# Patient Record
Sex: Female | Born: 1940 | Race: Black or African American | Hispanic: No | Smoking: Never smoker
Health system: Southern US, Community
[De-identification: ages and names within clinical notes are randomized; demographics above are authoritative.]

## PROBLEM LIST (undated history)

## (undated) DIAGNOSIS — I509 Heart failure, unspecified: Secondary | ICD-10-CM

## (undated) DIAGNOSIS — D869 Sarcoidosis, unspecified: Secondary | ICD-10-CM

## (undated) DIAGNOSIS — E119 Type 2 diabetes mellitus without complications: Secondary | ICD-10-CM

## (undated) DIAGNOSIS — C801 Malignant (primary) neoplasm, unspecified: Secondary | ICD-10-CM

## (undated) HISTORY — PX: PACEMAKER IMPLANT: EP1218

---

## 2002-08-02 HISTORY — PX: MASTECTOMY: SHX3

## 2017-12-03 ENCOUNTER — Inpatient Hospital Stay (HOSPITAL_COMMUNITY): Payer: Medicare HMO | Admitting: Certified Registered"

## 2017-12-03 ENCOUNTER — Inpatient Hospital Stay (HOSPITAL_COMMUNITY): Payer: Medicare HMO

## 2017-12-03 ENCOUNTER — Encounter (HOSPITAL_COMMUNITY)
Admission: EM | Disposition: A | Discharge status: Skilled Nursing Facility | Payer: Self-pay | Source: Home / Self Care | Attending: Internal Medicine

## 2017-12-03 ENCOUNTER — Emergency Department (HOSPITAL_BASED_OUTPATIENT_CLINIC_OR_DEPARTMENT_OTHER): Payer: Medicare HMO

## 2017-12-03 ENCOUNTER — Other Ambulatory Visit: Payer: Self-pay

## 2017-12-03 ENCOUNTER — Inpatient Hospital Stay (HOSPITAL_BASED_OUTPATIENT_CLINIC_OR_DEPARTMENT_OTHER)
Admission: EM | Admit: 2017-12-03 | Discharge: 2017-12-15 | DRG: 023 | Disposition: A | Discharge status: Skilled Nursing Facility | Payer: Medicare HMO | Attending: Family Medicine | Admitting: Family Medicine

## 2017-12-03 ENCOUNTER — Emergency Department (HOSPITAL_COMMUNITY): Payer: Medicare HMO

## 2017-12-03 ENCOUNTER — Encounter (HOSPITAL_BASED_OUTPATIENT_CLINIC_OR_DEPARTMENT_OTHER): Payer: Self-pay | Admitting: Emergency Medicine

## 2017-12-03 DIAGNOSIS — I618 Other nontraumatic intracerebral hemorrhage: Secondary | ICD-10-CM | POA: Diagnosis present

## 2017-12-03 DIAGNOSIS — I509 Heart failure, unspecified: Secondary | ICD-10-CM

## 2017-12-03 DIAGNOSIS — I130 Hypertensive heart and chronic kidney disease with heart failure and stage 1 through stage 4 chronic kidney disease, or unspecified chronic kidney disease: Secondary | ICD-10-CM | POA: Diagnosis present

## 2017-12-03 DIAGNOSIS — K922 Gastrointestinal hemorrhage, unspecified: Secondary | ICD-10-CM | POA: Diagnosis not present

## 2017-12-03 DIAGNOSIS — N183 Chronic kidney disease, stage 3 (moderate): Secondary | ICD-10-CM | POA: Diagnosis not present

## 2017-12-03 DIAGNOSIS — J90 Pleural effusion, not elsewhere classified: Secondary | ICD-10-CM | POA: Diagnosis not present

## 2017-12-03 DIAGNOSIS — R402342 Coma scale, best motor response, flexion withdrawal, at arrival to emergency department: Secondary | ICD-10-CM | POA: Diagnosis present

## 2017-12-03 DIAGNOSIS — K7031 Alcoholic cirrhosis of liver with ascites: Secondary | ICD-10-CM

## 2017-12-03 DIAGNOSIS — I482 Chronic atrial fibrillation, unspecified: Secondary | ICD-10-CM

## 2017-12-03 DIAGNOSIS — I081 Rheumatic disorders of both mitral and tricuspid valves: Secondary | ICD-10-CM | POA: Diagnosis present

## 2017-12-03 DIAGNOSIS — J9601 Acute respiratory failure with hypoxia: Secondary | ICD-10-CM | POA: Insufficient documentation

## 2017-12-03 DIAGNOSIS — D689 Coagulation defect, unspecified: Secondary | ICD-10-CM | POA: Diagnosis not present

## 2017-12-03 DIAGNOSIS — K766 Portal hypertension: Secondary | ICD-10-CM | POA: Diagnosis present

## 2017-12-03 DIAGNOSIS — I50811 Acute right heart failure: Secondary | ICD-10-CM | POA: Diagnosis not present

## 2017-12-03 DIAGNOSIS — Z950 Presence of cardiac pacemaker: Secondary | ICD-10-CM

## 2017-12-03 DIAGNOSIS — I639 Cerebral infarction, unspecified: Secondary | ICD-10-CM | POA: Diagnosis present

## 2017-12-03 DIAGNOSIS — R29734 NIHSS score 34: Secondary | ICD-10-CM | POA: Diagnosis present

## 2017-12-03 DIAGNOSIS — I313 Pericardial effusion (noninflammatory): Secondary | ICD-10-CM | POA: Diagnosis present

## 2017-12-03 DIAGNOSIS — I371 Nonrheumatic pulmonary valve insufficiency: Secondary | ICD-10-CM | POA: Diagnosis not present

## 2017-12-03 DIAGNOSIS — I5033 Acute on chronic diastolic (congestive) heart failure: Secondary | ICD-10-CM | POA: Diagnosis present

## 2017-12-03 DIAGNOSIS — R791 Abnormal coagulation profile: Secondary | ICD-10-CM | POA: Diagnosis not present

## 2017-12-03 DIAGNOSIS — N179 Acute kidney failure, unspecified: Secondary | ICD-10-CM | POA: Diagnosis present

## 2017-12-03 DIAGNOSIS — Z452 Encounter for adjustment and management of vascular access device: Secondary | ICD-10-CM

## 2017-12-03 DIAGNOSIS — D649 Anemia, unspecified: Secondary | ICD-10-CM | POA: Diagnosis not present

## 2017-12-03 DIAGNOSIS — Z4659 Encounter for fitting and adjustment of other gastrointestinal appliance and device: Secondary | ICD-10-CM

## 2017-12-03 DIAGNOSIS — I63312 Cerebral infarction due to thrombosis of left middle cerebral artery: Secondary | ICD-10-CM

## 2017-12-03 DIAGNOSIS — I6602 Occlusion and stenosis of left middle cerebral artery: Secondary | ICD-10-CM | POA: Diagnosis present

## 2017-12-03 DIAGNOSIS — I700 Atherosclerosis of aorta: Secondary | ICD-10-CM | POA: Diagnosis not present

## 2017-12-03 DIAGNOSIS — I63512 Cerebral infarction due to unspecified occlusion or stenosis of left middle cerebral artery: Secondary | ICD-10-CM | POA: Diagnosis not present

## 2017-12-03 DIAGNOSIS — E871 Hypo-osmolality and hyponatremia: Secondary | ICD-10-CM | POA: Diagnosis not present

## 2017-12-03 DIAGNOSIS — I6389 Other cerebral infarction: Secondary | ICD-10-CM | POA: Diagnosis not present

## 2017-12-03 DIAGNOSIS — I63412 Cerebral infarction due to embolism of left middle cerebral artery: Secondary | ICD-10-CM | POA: Diagnosis present

## 2017-12-03 DIAGNOSIS — J9621 Acute and chronic respiratory failure with hypoxia: Secondary | ICD-10-CM | POA: Diagnosis not present

## 2017-12-03 DIAGNOSIS — D860 Sarcoidosis of lung: Secondary | ICD-10-CM | POA: Diagnosis not present

## 2017-12-03 DIAGNOSIS — I959 Hypotension, unspecified: Secondary | ICD-10-CM | POA: Diagnosis not present

## 2017-12-03 DIAGNOSIS — J9690 Respiratory failure, unspecified, unspecified whether with hypoxia or hypercapnia: Secondary | ICD-10-CM | POA: Diagnosis present

## 2017-12-03 DIAGNOSIS — Z79810 Long term (current) use of selective estrogen receptor modulators (SERMs): Secondary | ICD-10-CM

## 2017-12-03 DIAGNOSIS — I480 Paroxysmal atrial fibrillation: Secondary | ICD-10-CM | POA: Diagnosis not present

## 2017-12-03 DIAGNOSIS — K3189 Other diseases of stomach and duodenum: Secondary | ICD-10-CM | POA: Diagnosis not present

## 2017-12-03 DIAGNOSIS — Z79899 Other long term (current) drug therapy: Secondary | ICD-10-CM

## 2017-12-03 DIAGNOSIS — I6523 Occlusion and stenosis of bilateral carotid arteries: Secondary | ICD-10-CM | POA: Diagnosis not present

## 2017-12-03 DIAGNOSIS — D509 Iron deficiency anemia, unspecified: Secondary | ICD-10-CM | POA: Diagnosis present

## 2017-12-03 DIAGNOSIS — K761 Chronic passive congestion of liver: Secondary | ICD-10-CM | POA: Diagnosis present

## 2017-12-03 DIAGNOSIS — E1151 Type 2 diabetes mellitus with diabetic peripheral angiopathy without gangrene: Secondary | ICD-10-CM | POA: Diagnosis present

## 2017-12-03 DIAGNOSIS — I6502 Occlusion and stenosis of left vertebral artery: Secondary | ICD-10-CM | POA: Diagnosis present

## 2017-12-03 DIAGNOSIS — I272 Pulmonary hypertension, unspecified: Secondary | ICD-10-CM | POA: Diagnosis not present

## 2017-12-03 DIAGNOSIS — G8191 Hemiplegia, unspecified affecting right dominant side: Secondary | ICD-10-CM | POA: Diagnosis present

## 2017-12-03 DIAGNOSIS — E1122 Type 2 diabetes mellitus with diabetic chronic kidney disease: Secondary | ICD-10-CM | POA: Diagnosis present

## 2017-12-03 DIAGNOSIS — E785 Hyperlipidemia, unspecified: Secondary | ICD-10-CM | POA: Diagnosis present

## 2017-12-03 DIAGNOSIS — E1165 Type 2 diabetes mellitus with hyperglycemia: Secondary | ICD-10-CM | POA: Diagnosis present

## 2017-12-03 DIAGNOSIS — I50813 Acute on chronic right heart failure: Secondary | ICD-10-CM | POA: Diagnosis not present

## 2017-12-03 DIAGNOSIS — R402132 Coma scale, eyes open, to sound, at arrival to emergency department: Secondary | ICD-10-CM | POA: Diagnosis present

## 2017-12-03 DIAGNOSIS — R4701 Aphasia: Secondary | ICD-10-CM | POA: Diagnosis present

## 2017-12-03 DIAGNOSIS — D869 Sarcoidosis, unspecified: Secondary | ICD-10-CM | POA: Diagnosis not present

## 2017-12-03 DIAGNOSIS — J9811 Atelectasis: Secondary | ICD-10-CM | POA: Diagnosis not present

## 2017-12-03 DIAGNOSIS — Z9012 Acquired absence of left breast and nipple: Secondary | ICD-10-CM

## 2017-12-03 DIAGNOSIS — D0512 Intraductal carcinoma in situ of left breast: Secondary | ICD-10-CM | POA: Diagnosis present

## 2017-12-03 DIAGNOSIS — I361 Nonrheumatic tricuspid (valve) insufficiency: Secondary | ICD-10-CM | POA: Diagnosis not present

## 2017-12-03 HISTORY — DX: Heart failure, unspecified: I50.9

## 2017-12-03 HISTORY — DX: Sarcoidosis, unspecified: D86.9

## 2017-12-03 HISTORY — PX: IR PERCUTANEOUS ART THROMBECTOMY/INFUSION INTRACRANIAL INC DIAG ANGIO: IMG6087

## 2017-12-03 HISTORY — DX: Type 2 diabetes mellitus without complications: E11.9

## 2017-12-03 HISTORY — PX: IR CT HEAD LTD: IMG2386

## 2017-12-03 HISTORY — DX: Malignant (primary) neoplasm, unspecified: C80.1

## 2017-12-03 HISTORY — PX: RADIOLOGY WITH ANESTHESIA: SHX6223

## 2017-12-03 LAB — COMPREHENSIVE METABOLIC PANEL
ALK PHOS: 48 U/L (ref 38–126)
ALT: 10 U/L — AB (ref 14–54)
ANION GAP: 13 (ref 5–15)
AST: 19 U/L (ref 15–41)
Albumin: 2.8 g/dL — ABNORMAL LOW (ref 3.5–5.0)
BILIRUBIN TOTAL: 1 mg/dL (ref 0.3–1.2)
BUN: 51 mg/dL — ABNORMAL HIGH (ref 6–20)
CALCIUM: 8.4 mg/dL — AB (ref 8.9–10.3)
CO2: 21 mmol/L — ABNORMAL LOW (ref 22–32)
CREATININE: 2.04 mg/dL — AB (ref 0.44–1.00)
Chloride: 94 mmol/L — ABNORMAL LOW (ref 101–111)
GFR, EST AFRICAN AMERICAN: 26 mL/min — AB (ref >60)
GFR, EST NON AFRICAN AMERICAN: 23 mL/min — AB (ref >60)
Glucose, Bld: 137 mg/dL — ABNORMAL HIGH (ref 65–99)
Potassium: 4.2 mmol/L (ref 3.5–5.1)
Sodium: 128 mmol/L — ABNORMAL LOW (ref 135–145)
TOTAL PROTEIN: 5.8 g/dL — AB (ref 6.5–8.1)

## 2017-12-03 LAB — DIFFERENTIAL
BASOS PCT: 1 %
Basophils Absolute: 0.1 10*3/uL (ref 0.0–0.1)
EOS PCT: 1 %
Eosinophils Absolute: 0.1 10*3/uL (ref 0.0–0.7)
LYMPHS ABS: 1.3 10*3/uL (ref 0.7–4.0)
LYMPHS PCT: 19 %
MONOS PCT: 16 %
Monocytes Absolute: 1.1 10*3/uL — ABNORMAL HIGH (ref 0.1–1.0)
NEUTROS ABS: 4.5 10*3/uL (ref 1.7–7.7)
Neutrophils Relative %: 63 %

## 2017-12-03 LAB — CBG MONITORING, ED
GLUCOSE-CAPILLARY: 118 mg/dL — AB (ref 65–99)
GLUCOSE-CAPILLARY: 149 mg/dL — AB (ref 65–99)
GLUCOSE-CAPILLARY: 113 mg/dL — AB (ref 65–99)

## 2017-12-03 LAB — RAPID URINE DRUG SCREEN, HOSP PERFORMED
Amphetamines: NOT DETECTED
BARBITURATES: NOT DETECTED
Benzodiazepines: POSITIVE — AB
Cocaine: NOT DETECTED
OPIATES: NOT DETECTED
Tetrahydrocannabinol: NOT DETECTED

## 2017-12-03 LAB — I-STAT ARTERIAL BLOOD GAS, ED
ACID-BASE EXCESS: 5 mmol/L — AB (ref 0.0–2.0)
Bicarbonate: 27.7 mmol/L (ref 20.0–28.0)
O2 SAT: 99 %
TCO2: 29 mmol/L (ref 22–32)
pCO2 arterial: 32 mmHg (ref 32.0–48.0)
pH, Arterial: 7.546 — ABNORMAL HIGH (ref 7.350–7.450)
pO2, Arterial: 140 mmHg — ABNORMAL HIGH (ref 83.0–108.0)

## 2017-12-03 LAB — URINALYSIS, ROUTINE W REFLEX MICROSCOPIC
Bilirubin Urine: NEGATIVE
Glucose, UA: NEGATIVE mg/dL
HGB URINE DIPSTICK: NEGATIVE
KETONES UR: NEGATIVE mg/dL
Leukocytes, UA: NEGATIVE
Nitrite: NEGATIVE
PROTEIN: NEGATIVE mg/dL
Specific Gravity, Urine: 1.015 (ref 1.005–1.030)
pH: 9 — ABNORMAL HIGH (ref 5.0–8.0)

## 2017-12-03 LAB — BLOOD GAS, ARTERIAL
Acid-base deficit: 1.5 mmol/L (ref 0.0–2.0)
Bicarbonate: 22.2 mmol/L (ref 20.0–28.0)
Drawn by: 24513
FIO2: 50
LHR: 16 {breaths}/min
MECHVT: 460 mL
O2 SAT: 96 %
PEEP/CPAP: 5 cmH2O
Patient temperature: 98.6
pCO2 arterial: 34 mmHg (ref 32.0–48.0)
pH, Arterial: 7.431 (ref 7.350–7.450)
pO2, Arterial: 88.2 mmHg (ref 83.0–108.0)

## 2017-12-03 LAB — ETHANOL: Alcohol, Ethyl (B): <10 mg/dL (ref <10)

## 2017-12-03 LAB — CBC
HCT: 28.6 % — ABNORMAL LOW (ref 36.0–46.0)
Hemoglobin: 10.1 g/dL — ABNORMAL LOW (ref 12.0–15.0)
MCH: 24 pg — ABNORMAL LOW (ref 26.0–34.0)
MCHC: 35.3 g/dL (ref 30.0–36.0)
MCV: 68.1 fL — ABNORMAL LOW (ref 78.0–100.0)
PLATELETS: 432 10*3/uL — AB (ref 150–400)
RBC: 4.2 MIL/uL (ref 3.87–5.11)
RDW: 19.7 % — AB (ref 11.5–15.5)
WBC: 7.1 10*3/uL (ref 4.0–10.5)

## 2017-12-03 LAB — TRIGLYCERIDES: Triglycerides: 63 mg/dL (ref <150)

## 2017-12-03 LAB — BRAIN NATRIURETIC PEPTIDE: B Natriuretic Peptide: 381.5 pg/mL — ABNORMAL HIGH (ref 0.0–100.0)

## 2017-12-03 LAB — TROPONIN I: Troponin I: <0.03 ng/mL (ref <0.03)

## 2017-12-03 LAB — MRSA PCR SCREENING: MRSA by PCR: NEGATIVE

## 2017-12-03 SURGERY — IR WITH ANESTHESIA
Anesthesia: General

## 2017-12-03 MED ORDER — GLYCOPYRROLATE 0.2 MG/ML IJ SOLN
INTRAMUSCULAR | Status: DC | PRN
  Administered 2017-12-03: 0.2 mg via INTRAVENOUS

## 2017-12-03 MED ORDER — IOPAMIDOL (ISOVUE-370) INJECTION 76%
INTRAVENOUS | Status: AC
  Filled 2017-12-03: qty 100

## 2017-12-03 MED ORDER — CHLORHEXIDINE GLUCONATE 0.12% ORAL RINSE (MEDLINE KIT)
15.0000 mL | Freq: Two times a day (BID) | OROMUCOSAL | Status: DC
  Administered 2017-12-03 – 2017-12-09 (×11): 15 mL via OROMUCOSAL

## 2017-12-03 MED ORDER — SODIUM CHLORIDE 0.9 % IV SOLN
INTRAVENOUS | Status: DC
  Administered 2017-12-04: 11:00:00 via INTRAVENOUS
  Administered 2017-12-04: 1000 mL via INTRAVENOUS

## 2017-12-03 MED ORDER — LIDOCAINE HCL 1 % IJ SOLN
INTRAMUSCULAR | Status: AC
  Filled 2017-12-03: qty 20

## 2017-12-03 MED ORDER — ETOMIDATE 2 MG/ML IV SOLN
30.0000 mg | Freq: Once | INTRAVENOUS | Status: AC
  Administered 2017-12-03: 30 mg via INTRAVENOUS

## 2017-12-03 MED ORDER — DOCUSATE SODIUM 50 MG/5ML PO LIQD
100.0000 mg | Freq: Two times a day (BID) | ORAL | Status: DC | PRN
  Filled 2017-12-03: qty 10

## 2017-12-03 MED ORDER — FAMOTIDINE IN NACL 20-0.9 MG/50ML-% IV SOLN
20.0000 mg | Freq: Two times a day (BID) | INTRAVENOUS | Status: DC
  Administered 2017-12-04: 20 mg via INTRAVENOUS
  Filled 2017-12-03: qty 50

## 2017-12-03 MED ORDER — ONDANSETRON HCL 4 MG/2ML IJ SOLN: 4.0000 mg | Freq: Four times a day (QID) | INTRAMUSCULAR | Status: DC | PRN

## 2017-12-03 MED ORDER — ACETAMINOPHEN 160 MG/5ML PO SOLN: 650.0000 mg | ORAL | Status: DC | PRN

## 2017-12-03 MED ORDER — MIDAZOLAM HCL 2 MG/2ML IJ SOLN
INTRAMUSCULAR | Status: AC
  Filled 2017-12-03: qty 6

## 2017-12-03 MED ORDER — TIROFIBAN HCL IN NACL 5-0.9 MG/100ML-% IV SOLN
INTRAVENOUS | Status: AC
  Filled 2017-12-03: qty 100

## 2017-12-03 MED ORDER — ROCURONIUM BROMIDE 50 MG/5ML IV SOLN
100.0000 mg | Freq: Once | INTRAVENOUS | Status: AC
  Administered 2017-12-03: 100 mg via INTRAVENOUS

## 2017-12-03 MED ORDER — ASPIRIN 325 MG PO TABS
ORAL_TABLET | ORAL | Status: AC
  Filled 2017-12-03: qty 1

## 2017-12-03 MED ORDER — PROPOFOL 1000 MG/100ML IV EMUL
0.0000 ug/kg/min | INTRAVENOUS | Status: DC
  Administered 2017-12-03: 25 ug/kg/min via INTRAVENOUS
  Administered 2017-12-04: 20 ug/kg/min via INTRAVENOUS
  Filled 2017-12-03 (×2): qty 100

## 2017-12-03 MED ORDER — ACETAMINOPHEN 325 MG PO TABS
650.0000 mg | ORAL_TABLET | ORAL | Status: DC | PRN
  Filled 2017-12-03: qty 2

## 2017-12-03 MED ORDER — SODIUM CHLORIDE 0.9 % IV SOLN: 250.0000 mL | INTRAVENOUS | Status: DC | PRN

## 2017-12-03 MED ORDER — CEFAZOLIN SODIUM-DEXTROSE 2-4 GM/100ML-% IV SOLN
INTRAVENOUS | Status: AC
  Filled 2017-12-03: qty 100

## 2017-12-03 MED ORDER — PHENYLEPHRINE HCL 10 MG/ML IJ SOLN
INTRAVENOUS | Status: DC | PRN
  Administered 2017-12-03: 50 ug/min via INTRAVENOUS
  Administered 2017-12-03: 100 ug/min via INTRAVENOUS

## 2017-12-03 MED ORDER — NITROGLYCERIN 1 MG/10 ML FOR IR/CATH LAB
INTRA_ARTERIAL | Status: AC
  Administered 2017-12-03 (×2): 25 ug via INTRA_ARTERIAL
  Filled 2017-12-03: qty 10

## 2017-12-03 MED ORDER — PROPOFOL 500 MG/50ML IV EMUL
INTRAVENOUS | Status: DC | PRN
  Administered 2017-12-03: 25 ug/kg/min via INTRAVENOUS

## 2017-12-03 MED ORDER — IOPAMIDOL (ISOVUE-370) INJECTION 76%
50.0000 mL | Freq: Once | INTRAVENOUS | Status: AC | PRN
  Administered 2017-12-03: 50 mL via INTRAVENOUS

## 2017-12-03 MED ORDER — FENTANYL CITRATE (PF) 100 MCG/2ML IJ SOLN
100.0000 ug | Freq: Once | INTRAMUSCULAR | Status: AC
  Administered 2017-12-03: 100 ug via INTRAVENOUS

## 2017-12-03 MED ORDER — SODIUM CHLORIDE 0.9 % IV SOLN
INTRAVENOUS | Status: DC | PRN
  Administered 2017-12-03: 11:00:00 via INTRAVENOUS

## 2017-12-03 MED ORDER — PROPOFOL 1000 MG/100ML IV EMUL
5.0000 ug/kg/min | INTRAVENOUS | Status: DC
  Administered 2017-12-03: 25 ug/kg/min via INTRAVENOUS

## 2017-12-03 MED ORDER — SODIUM CHLORIDE 0.9 % IV SOLN
0.0000 ug/min | INTRAVENOUS | Status: DC
  Administered 2017-12-03: 37.5 ug/min via INTRAVENOUS
  Administered 2017-12-03: 250 ug/min via INTRAVENOUS
  Administered 2017-12-03: 325 ug/min via INTRAVENOUS
  Administered 2017-12-03: 37.5 ug/min via INTRAVENOUS
  Administered 2017-12-03: 325 ug/min via INTRAVENOUS
  Administered 2017-12-04 (×2): 50 ug/min via INTRAVENOUS
  Administered 2017-12-04: 60 ug/min via INTRAVENOUS
  Filled 2017-12-03 (×4): qty 10
  Filled 2017-12-03: qty 1
  Filled 2017-12-03: qty 10
  Filled 2017-12-03: qty 1
  Filled 2017-12-03 (×2): qty 10

## 2017-12-03 MED ORDER — FENTANYL CITRATE (PF) 100 MCG/2ML IJ SOLN: 50.0000 ug | INTRAMUSCULAR | Status: DC | PRN

## 2017-12-03 MED ORDER — STROKE: EARLY STAGES OF RECOVERY BOOK
Freq: Once | Status: AC
  Administered 2017-12-04: 17:00:00
  Filled 2017-12-03: qty 1

## 2017-12-03 MED ORDER — FENTANYL CITRATE (PF) 100 MCG/2ML IJ SOLN
INTRAMUSCULAR | Status: AC
  Administered 2017-12-03: 100 ug via INTRAVENOUS
  Filled 2017-12-03: qty 2

## 2017-12-03 MED ORDER — TICAGRELOR 90 MG PO TABS
ORAL_TABLET | ORAL | Status: AC
  Filled 2017-12-03: qty 2

## 2017-12-03 MED ORDER — NITROGLYCERIN 1 MG/10 ML FOR IR/CATH LAB
INTRA_ARTERIAL | Status: AC
  Filled 2017-12-03: qty 10

## 2017-12-03 MED ORDER — NICARDIPINE HCL IN NACL 20-0.86 MG/200ML-% IV SOLN
0.0000 mg/h | INTRAVENOUS | Status: DC
  Filled 2017-12-03 (×2): qty 200

## 2017-12-03 MED ORDER — FUROSEMIDE 10 MG/ML IJ SOLN
40.0000 mg | Freq: Two times a day (BID) | INTRAMUSCULAR | Status: DC
  Administered 2017-12-03 – 2017-12-04 (×3): 40 mg via INTRAVENOUS
  Filled 2017-12-03 (×3): qty 4

## 2017-12-03 MED ORDER — CEFAZOLIN SODIUM-DEXTROSE 2-3 GM-%(50ML) IV SOLR
INTRAVENOUS | Status: DC | PRN
  Administered 2017-12-03: 2 g via INTRAVENOUS

## 2017-12-03 MED ORDER — IOPAMIDOL (ISOVUE-300) INJECTION 61%
INTRAVENOUS | Status: AC
  Administered 2017-12-03: 60 mL
  Filled 2017-12-03: qty 150

## 2017-12-03 MED ORDER — DEXTROSE 5 % IV SOLN
0.0000 ug/min | INTRAVENOUS | Status: DC
  Administered 2017-12-04: 2 ug/min via INTRAVENOUS
  Administered 2017-12-04: 31 ug/min via INTRAVENOUS
  Administered 2017-12-04 (×3): 35 ug/min via INTRAVENOUS
  Filled 2017-12-03 (×5): qty 4

## 2017-12-03 MED ORDER — ACETAMINOPHEN 650 MG RE SUPP
650.0000 mg | RECTAL | Status: DC | PRN
Start: 2017-12-03 — End: 2017-12-15

## 2017-12-03 MED ORDER — IOPAMIDOL (ISOVUE-300) INJECTION 61%
INTRAVENOUS | Status: AC
  Administered 2017-12-03: 100 mL
  Filled 2017-12-03: qty 300

## 2017-12-03 MED ORDER — MIDAZOLAM HCL 2 MG/2ML IJ SOLN
5.0000 mg | Freq: Once | INTRAMUSCULAR | Status: AC
  Administered 2017-12-03: 5 mg via INTRAVENOUS

## 2017-12-03 MED ORDER — ORAL CARE MOUTH RINSE
15.0000 mL | OROMUCOSAL | Status: DC
  Administered 2017-12-03 – 2017-12-06 (×31): 15 mL via OROMUCOSAL

## 2017-12-03 MED ORDER — ALBUMIN HUMAN 5 % IV SOLN
INTRAVENOUS | Status: DC | PRN
  Administered 2017-12-03: 12:00:00 via INTRAVENOUS

## 2017-12-03 MED ORDER — EPTIFIBATIDE 20 MG/10ML IV SOLN
INTRAVENOUS | Status: AC
  Filled 2017-12-03: qty 10

## 2017-12-03 NOTE — Consult Note (Addendum)
Referred By: Carlyon Prows, DO  Chief Complaint: Shortness of BreathCODE STROKE   HPI: Ana Gardner is an 77 y.o. 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  Patient walked into to the [facility] ED around 8am today with complaints of shortness of breath and edema in her bilateral lower extremities over the last month.  Of note she was actually admitted 11/25/2017 and discharged 11/29/17, with complaints of shortness of breath.  She was diagnosed with bilateral pleural effusion -right more than left, likely related to hypervolemic state from congestive heart failure and cirrhosis of liver. Ultrasound guided thoracentesis done 11/28/2017 with 1 L of pleural fluid drained right and another thoracentesis done on 11/29/2017 650 mL of fluid with drained from the left pleural space. She was started on torsemide and metolazone.  Today in the ER, x-ray of the chest showed large recurrent right pleural effusion as well as underlying consolidation/atelectasis of the right lung.  Today around 8:30 AM in the ED patient became acutely aphasic with dense right hemiplegia as well as forced left gaze deviation.  Emergent telemetry stroke was consulted at that time patient had NIHSS of 26.  Immediate CT Noncon was done that shows infarct involving the high right parietal lobe and left frontal operculum.  Patient could not undergo CT angiogram at that time due to  respiratory status.  , she was unable to follow commands or protect her airway and was intubated immediately. Patient was transferred immediately to [facility] for further stroke management.  On arrival to [facility] patient immediately went to CT.  CT head/neck Angio  Showed proximal posterior division left M2 occlusion, More distal anterior division left M2/M3 occlusion as well aso occluded left vertebral artery with minimal reconstitution in the neck.  Neuro interventionalist was consulted immediately for endovascular revascularization  Patient unable to provide review of systems or HPI secondary to being intubated and non-repsosive with no sedation Called Family with no response  She lives alone and takes care of herself at home and therefore I would not be comfortable excluding her from treatment, therefore IR is going to be performed by emergent to physician consent.  LSN:12/03/2017  [id number] tPA Given: Not eligible for IV TPA because of recent GI hemorrhage in April 2019 Premorbid modified Rankin scale (mRS):1  Past Medical History:  Diagnosis Date  . Cancer (HCC)    B/L  . CHF (congestive heart failure) ([place])   . Diabetes mellitus without complication ([place])   . Sarcoidosis     Past Surgical History:  Procedure Laterality Date  . MASTECTOMY Left 2004  . PACEMAKER IMPLANT      History reviewed. No pertinent family history. Social History:  reports that she has never smoked. She has never used smokeless tobacco. She reports that she does not drink alcohol or use drugs.  Allergies: Not on File  Medications:  . iopamidol        ROS: Unable to accurately obtain secondary to patient currently  Intubated andn on responsive.  Physical Examination: Blood pressure  95/63, pulse 82, resp. rate 16, SpO2 100 %. HEENT-  Normocephalic, no lesions, without obvious abnormality.  Normal external eye and conjunctiva.  patient is currently intubated Cardiovascular- Paced  rhyhtm Lungs-currently intubated with vent breath sounds  abdomen-normal bowel sounds Musculoskeletal-no joint tendernes mild edema bilateral lower extremities  skin-warm and dry,   Neurological Examination Mental Status: Patient currently is intubated, not sedated at this time but not responsive to any stimuli.   Cranial Nerves: II:III,IV, VI: Able to assess patient status patient not responsive, patient without fixed gaze, no doll's eyes, right dilated pupil, left sluggishly reactive.  V,VII: Unable to accurately assess patient with mask from intubation VIII: IX,X: XI: XII: Unable to accurately assess patient not responsive Motor/Sensory: Patient does not move extremities or withdraw to stimuli, she does not grimace to noxious and harsh stimuli  Tone and bulk:normal tone throughout; no atrophy noted Sensory: Pinprick and light touch intact throughout, bilaterally Deep Tendon Reflexes: Muted Plantars: Right: muted   Left: muted Cerebellar:/Gait: Unable to assess patient not responsive  NIHSS 1a Level of Conscious.:3 1b LOC Questions: Aphasic 2 1c LOC Commands: 2 2 Best Gaze: 2 3 Visual: 0 non responsive 4 Facial Palsy: 1 5a Motor Arm - left: 4 5b Motor Arm - Right:4 6a Motor Leg - Left: 4 6b Motor Leg - Right: 4 7 Limb Ataxia: 0  Non repsonsive 8 Sensory: 0 non repsonsive 9 Best Language:3 10 Dysarthria: UN intubated 11 Extinct. and Inatten.: 0 TOTAL: 29  Results for orders placed or performed during the hospital encounter of 12/03/17 (from the past 48 hour(s))  CBG monitoring, ED     Status: Abnormal   Collection Time: 12/03/17  8:39 AM  Result Value Ref Range   Glucose-Capillary 149 (H) 65 - 99 mg/dL   Comment 1 Notify RN   Troponin I     Status: None   Collection Time: 12/03/17  9:09 AM  Result Value Ref Range   Troponin I <0.03 <0.03 ng/mL    Comment: Performed at [facility], [address] [place], [place] [address]  Brain natriuretic peptide      Status: Abnormal   Collection Time: 12/03/17  9:09 AM  Result Value Ref Range   B Natriuretic Peptide 381.5 (H) 0.0 - 100.0 pg/mL    Comment: Performed at [facility], [address] [place], [place] [address]  Ethanol     Status: None   Collection Time: 12/03/17  9:09 AM  Result Value Ref Range   Alcohol, Ethyl (B) <10 <10 mg/dL    Comment:        LOWEST DETECTABLE LIMIT FOR SERUM ALCOHOL IS 10 mg/dL FOR MEDICAL PURPOSES ONLY Performed at [facility], [address] [place], [place] [address]   Differential     Status: Abnormal   Collection Time: 12/03/17  9:09 AM  Result Value Ref Range   Neutrophils Relative % 63 %   Lymphocytes Relative 19 %   Monocytes Relative 16 %   Eosinophils Relative 1 %   Basophils Relative 1 %   Neutro Abs 4.5 1.7 - 7.7 K/uL   Lymphs Abs 1.3 0.7 - 4.0 K/uL   Monocytes Absolute 1.1 (H) 0.1 - 1.0 K/uL   Eosinophils Absolute 0.1 0.0 - 0.7 K/uL   Basophils Absolute 0.1 0.0 - 0.1 K/uL   RBC Morphology TARGET CELLS     Comment: Schistocytes present ELLIPTOCYTES Acanthocytes present Performed at [facility],  [address] [place], [place] [address]   Comprehensive metabolic panel     Status: Abnormal   Collection Time: 12/03/17  9:09 AM  Result Value Ref Range   Sodium 128 (L) 135 - 145 mmol/L   Potassium 4.2 3.5 - 5.1 mmol/L   Chloride 94 (L) 101 - 111 mmol/L   CO2 21 (L) 22 - 32 mmol/L   Glucose, Bld 137 (H) 65 - 99 mg/dL   BUN 51 (H) 6 - 20 mg/dL   Creatinine, Ser 2.04 (H) 0.44 - 1.00 mg/dL   Calcium 8.4 (L) 8.9 - 10.3 mg/dL   Total Protein 5.8 (L) 6.5 - 8.1 g/dL   Albumin 2.8 (L) 3.5 - 5.0 g/dL   AST 19 15 - 41 U/L   ALT 10 (L) 14 - 54 U/L   Alkaline Phosphatase 48 38 - 126 U/L   Total Bilirubin 1.0 0.3 - 1.2 mg/dL   GFR calc non Af Amer 23 (L) >60 mL/min   GFR calc Af Amer 26 (L) >60 mL/min    Comment: (NOTE) The eGFR has been calculated using the CKD EPI equation. This calculation has not been  validated in all clinical situations. eGFR's persistently <60 mL/min signify possible Chronic Kidney Disease.    Anion gap 13 5 - 15    Comment: Performed at [facility], [address] [place], [place] [address]  CBC     Status: Abnormal   Collection Time: 12/03/17  9:09 AM  Result Value Ref Range   WBC 7.1 4.0 - 10.5 K/uL    Comment: WHITE COUNT CONFIRMED ON SMEAR   RBC 4.20 3.87 - 5.11 MIL/uL   Hemoglobin 10.1 (L) 12.0 - 15.0 g/dL   HCT 28.6 (L) 36.0 - 46.0 %   MCV 68.1 (L) 78.0 - 100.0 fL   MCH 24.0 (L) 26.0 - 34.0 pg   MCHC 35.3 30.0 - 36.0 g/dL   RDW 19.7 (H) 11.5 - 15.5 %   Platelets 432 (H) 150 - 400 K/uL    Comment: Performed at [facility], [address] [place], [place] [address]  CBG monitoring, ED     Status: Abnormal   Collection Time: 12/03/17  9:16 AM  Result Value Ref Range   Glucose-Capillary 118 (H) 65 - 99 mg/dL  I-Stat arterial blood gas, ED     Status: Abnormal   Collection Time: 12/03/17  9:42 AM  Result Value Ref Range   pH, Arterial 7.546 (H) 7.350 - 7.450   pCO2 arterial 32.0 32.0 - 48.0 mmHg   pO2, Arterial 140.0 (H) 83.0 - 108.0 mmHg   Bicarbonate 27.7 20.0 - 28.0 mmol/L   TCO2 29 22 - 32 mmol/L   O2 Saturation 99.0 %   Acid-Base Excess 5.0 (H) 0.0 - 2.0 mmol/L   Patient temperature HIDE    Collection site RADIAL, ALLEN'S TEST ACCEPTABLE    Drawn by RT    Sample type ARTERIAL   CBG monitoring, ED     Status: Abnormal   Collection Time: 12/03/17 10:10 AM  Result Value Ref Range   Glucose-Capillary 113 (H) 65 - 99 mg/dL   Dg Chest Portable 1 View: 12/03/2017 IMPRESSION:  1. Endotracheal tube tip approximately 2 cm above the carina.  2. Significant volume loss of right lung since prior chest x-ray with recurrent right pleural effusion as well as underlying consolidation/atelectasis of the right lung.   Ct Head Code Stroke Wo Contrast 12/03/2017 IMPRESSION:  1. Infarcts involving the high right parietal lobe and left  frontal operculum appear remote.  2. Remote lacunar infarct of the left caudate head.  3. Remote infarcts of the posterior left cerebellum.  4. No acute infarct.  5. 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   CTA Head/ Neck 12/02/17 IMPRESSION: 1. Proximal posterior division left M2 occlusion. 2. More distal anterior division left M2/M3 occlusion. 3. Extensive intracranial small vessel disease. 4. Occluded left vertebral artery with minimal reconstitution in the neck. 5. Fetal type posterior cerebral arteries bilaterally. 6. Atherosclerotic changes at the carotid bifurcations and cavernous internal carotid arteries bilaterally without other significant stenosis. 7. Aortic Atherosclerosis (ICD10-I70.0). Calcifications involve the great vessel origins without significant stenoses. 8. Large bilateral pleural effusions, right greater than left. 9. Parenchymal volume loss in the right upper lobe likely related to the effusions. Central mass lesion not excluded. 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 Recommend dedicated CT the chest with contrast as the patient's condition allows.   Assessment: 77 y.o. female with extensive past medical history, who presented to to [facility] ER with shortness of breath around 8:00 this morning.  Around 8:30 AM in the ED patient became acutely aphasic with dense right hemiplegia as well as forced left gaze deviation.  Emergent telemetry stroke was consulted at that time patient had NIHSS of 26.  Immediate CT Noncon was done that shows infarct involving the high right parietal lobe and left frontal operculum.  Could not undergo CTA at [place]  secondary to respiratory status, she has since been intubated and sedated.  CTA at [facility]: showed Left MCA CVA with LVO.    1.  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  She is currently not responsive and intubation for  airway protection.  Patient was not a candidate for TPA due to her recent GI bleed April 2019.  Patient though is a candidate for endovascular revascularization to prevent further neurological injury.  Neuro interventionalist was consulted immediately.  Stroke Risk Factors - atrial fibrillation, diabetes mellitus, hyperlipidemia and hypertension   2.  Extensive intracranial small vessel disease likely secondary to patient's multiple comorbidities of diabetes, A. fib, hypertension dyslipidemia 3.  Large right pleural effusion- CCM to admit and manage 4.  Hypertension 5.  Severe right heart congestive heart failure with resulting severe tricuspid regurg 6. Chronic Afib/ Pacemaker 7. Dyslipidemia 8 HTN 9. Left Breast CA on Tamoxifen  Plan: -Admit to ICU for closer neuro monitoring postendovascular revascularization - HgbA1c, fasting lipid panel -Repeat CT 24 hours post neuro intervention -Hold anticoagulation for 24 hours post intervention, hold statin until 2 hours post intervention - PT consult, OT consult, Speech consult - Echocardiogram - Carotid dopplers - Risk factor modification - Telemetry monitoring -  Frequent neuro checks   Jacob Moores DNP Neuro-hospitalist Team [phone] 12/03/2017, 10:25 AM  I have seen the patient and reviewed the above note.    77 year old female with new left M2 occlusion in the setting of stopping anticoagulation due to recent GI bleed.  This is suspected due to recurrent upper GI bleeding from severe portal hypertensive gastropathy.  She had an EGD done which did not show any clear intervenable lesion and may need to discuss with GI in the setting of this event if she does well.  For now, she is going to the neuro ICU, I suspect she may have difficulty weaning given her extensive pleural effusions and CHF.  This patient is critically ill and at significant risk of neurological worsening, death and care requires constant monitoring of vital  signs, hemodynamics,respiratory and cardiac monitoring, neurological assessment, discussion with family, other specialists and medical decision making of high complexity. I spent 60 minutes of neurocritical care time  in the care of  this patient.  Roland Rack, MD [facility] [phone]  If 7pm- 7am, please page neurology on call as listed in [place]. 12/03/2017  6:54 PM

## 2017-12-03 NOTE — ED Provider Notes (Addendum)
[facility] Provider Note   CSN: [id number] Arrival date & time: 12/03/17  0806     History   Chief Complaint Chief Complaint  Patient presents with  . Shortness of Breath    HPI Ana Gardner is a 77 y.o. female.  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  Patient has been to [facility] several times most recent admission and discharge was 3 days ago.  She states when she was last admitted she had a thoracentesis done due to fluid accumulation in the right long and was started on torsemide and metolazone.  She was taken off Lasix and told to continue those medications at home.  She continued to complain of shortness of breath but she was told that it should improve.  Patient states she is now been home 3 days and she cannot take more than 2 steps without becoming severely short of breath.  She is not currently on oxygen at home.  She has had a dry cough but no sputum production or hemoptysis.  She denies any fevers, abdominal pain, chest pain, nausea or vomiting.  She has noted swelling to worsen in both of her lower legs and her abdomen.  She has been taken all of her medications as prescribed except the metolazone because she has not been able to pick it up yet.  She has not taken any medications this morning.    The history is provided by medical records and the patient.    No past medical history on file.  There are no active problems to display for this patient.   Past  Surgical History:  Procedure Laterality Date  . MASTECTOMY Left 2004  . PACEMAKER IMPLANT       OB History   None      Home Medications    Prior to Admission medications   Not on File    Family History No family history on file.  Social History Social History   Tobacco Use  . Smoking status: Not on file  Substance Use Topics  . Alcohol use: Not on file  . Drug use: Not on file     Allergies   Patient has no allergy information on record.   Review of Systems Review of Systems  All other systems reviewed and are negative.    Physical Exam Updated Vital Signs There were no vitals taken for this visit.  Physical Exam  Constitutional: She is oriented to person, place, and time. She appears well-developed and well-nourished. No distress.  HENT:  Head: Normocephalic and atraumatic.  Mouth/Throat: Oropharynx is clear and moist.  Eyes: Pupils are equal, round, and reactive to light. Conjunctivae and EOM are normal.  Neck: Normal range of motion. Neck supple. JVD present.  Cardiovascular: Normal rate, regular rhythm and intact distal pulses.  No murmur heard. Pulmonary/Chest: Effort normal. Tachypnea noted. No respiratory distress. She has decreased breath sounds in the right middle field and the right lower field. She has no wheezes. She has no rales.  Can speak in short sentences  Abdominal: Soft. She exhibits distension. There is no tenderness. There is no rebound and no guarding.  Musculoskeletal: Normal range of motion. She exhibits no tenderness.       Right lower leg: She exhibits edema.       Left lower leg: She exhibits edema.  3+ pitting edema up to the mid shin bilaterally.  Weeping also present  Neurological: She is alert and oriented to person, place, and time.  Skin: Skin is warm and dry. No rash noted. No erythema.  Psychiatric: She has a normal mood and affect. Her behavior is normal.  Nursing note and vitals reviewed.    ED Treatments /  Results  Labs (all labs ordered are listed, but only abnormal results are displayed) Labs Reviewed  BRAIN NATRIURETIC PEPTIDE - Abnormal; Notable for the following components:      Result Value   B Natriuretic Peptide 381.5 (*)    All other components within normal limits  DIFFERENTIAL - Abnormal; Notable for the following components:   Monocytes Absolute 1.1 (*)    All other components within normal limits  COMPREHENSIVE METABOLIC PANEL - Abnormal; Notable for the following components:   Sodium 128 (*)    Chloride 94 (*)    CO2 21 (*)    Glucose, Bld 137 (*)    BUN 51 (*)    Creatinine, Ser 2.04 (*)    Calcium 8.4 (*)    Total Protein 5.8 (*)    Albumin 2.8 (*)    ALT 10 (*)    GFR calc non Af Amer 23 (*)    GFR calc Af Amer 26 (*)    All other components within normal limits  CBC - Abnormal; Notable for the following components:   Hemoglobin 10.1 (*)    HCT 28.6 (*)    MCV 68.1 (*)    MCH 24.0 (*)    RDW 19.7 (*)    Platelets 432 (*)    All other components within normal limits  CBG MONITORING, ED - Abnormal; Notable for the following components:   Glucose-Capillary 149 (*)    All other components within normal limits  CBG MONITORING, ED - Abnormal; Notable for the following components:   Glucose-Capillary 118 (*)    All other components within normal limits  I-STAT ARTERIAL BLOOD GAS, ED - Abnormal; Notable for the following components:   pH, Arterial 7.546 (*)    pO2, Arterial 140.0 (*)    Acid-Base Excess 5.0 (*)    All other components within normal limits  CBG MONITORING, ED - Abnormal; Notable for the following components:   Glucose-Capillary 113 (*)    All other components within normal limits  TROPONIN I  ETHANOL  RAPID URINE DRUG SCREEN, HOSP PERFORMED  URINALYSIS, ROUTINE W REFLEX MICROSCOPIC    EKG EKG Interpretation  Date/Time:  Saturday Dec 03 2017 08:12:18 EDT Ventricular Rate:  82 PR Interval:    QRS Duration: 134 QT Interval:  537 QTC  Calculation: 628 R Axis:   112 Text Interpretation:  Atrial-paced rhythm RBBB and LPFB Anterior infarct, age indeterminate Baseline wander in lead(s) V3 No previous tracing Confirmed by Blanchie Dessert [phone]) on 12/03/2017 8:22:10 AM   Radiology Dg Chest Portable 1 View  Result Date: 12/03/2017 CLINICAL DATA:  Respiratory failure and status post intubation. Status post left thoracentesis on 11/29/2017 and right thoracentesis on 11/28/2017. EXAM: PORTABLE CHEST 1 VIEW COMPARISON:  11/29/2017 and 11/25/2017 chest x-rays at [facility] FINDINGS: Stable cardiac enlargement and appearance  of dual-chamber pacemaker. Endotracheal tube present with the tip approximately 2 cm above the carina. Since the prior chest x-ray, there is significant volume loss of the right lung with likely component recurrent pleural fluid as well as consolidation/atelectasis. The left lung shows no evidence of airspace disease, edema or pleural fluid. No pneumothorax identified. IMPRESSION: 1. Endotracheal tube tip approximately 2 cm above the carina. 2. Significant volume loss of right lung since prior chest x-ray with recurrent right pleural effusion as well as underlying consolidation/atelectasis of the right lung. Electronically Signed   By: Aletta Edouard M.D.   On: 12/03/2017 09:55   Ct Head Code Stroke Wo Contrast  Result Date: 12/03/2017 CLINICAL DATA:  Code stroke. Acute onset of unresponsiveness while in the emergency department. Patient is known nonverbal. Acute onset of right-sided weakness. EXAM: CT HEAD WITHOUT CONTRAST TECHNIQUE: Contiguous axial images were obtained from the base of the skull through the vertex without intravenous contrast. COMPARISON:  None. FINDINGS: Brain: No acute infarct, hemorrhage, or mass lesion is present. 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 Hypodense infarct is present in the high right parietal lobe,  likely also remote. A remote infarct of the left frontal operculum is present. Associated volume loss is noted. No acute left-sided infarct is present. No acute hemorrhage or mass lesion is present. Remote infarcts are present posteriorly within the left cerebellum. Brainstem is unremarkable. No significant extra-axial fluid collection is present. No significant extra-axial fluid collection is present. Vascular: Atherosclerotic calcifications are present within the internal carotid arteries bilaterally. There is no hyperdense vessel. Skull: Calvarium is intact. No focal lytic or blastic lesions are present. Sinuses/Orbits: The paranasal sinuses are clear. A remote right orbital blowout fracture is present. A right lens replacement is present. Globes and orbits are otherwise within normal limits. ASPECTS [facility] Stroke Program Early CT Score) - Ganglionic level infarction (caudate, lentiform nuclei, internal capsule, insula, M1-M3 cortex): 7/7 - Supraganglionic infarction (M4-M6 cortex): 3/3. Total score (0-10 with 10 being normal): 10/10. IMPRESSION: 1. Infarcts involving the high right parietal lobe and left frontal operculum appear remote. 2. Remote lacunar infarct of the left caudate head. 3. Remote infarcts of the posterior left cerebellum. 4. No acute infarct. 5. 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 6. ASPECTS is 10/10 These results were called by telephone at the time of interpretation on 12/03/2017 at 9:03 am to Dr. Blanchie Dessert , who verbally acknowledged these results. Electronically Signed   By: San Morelle M.D.   On: 12/03/2017 09:08    Procedures Procedure Name: Intubation Date/Time: 12/03/2017 10:48 AM Performed by: Blanchie Dessert, MD Pre-anesthesia Checklist: Patient identified, Patient being monitored, Emergency Drugs available, Timeout performed and Suction available Oxygen Delivery Method: Non-rebreather  mask Preoxygenation: Pre-oxygenation with 100% oxygen Induction Type: Rapid sequence Ventilation: Mask ventilation without difficulty Laryngoscope Size: Glidescope and 4 Grade View: Grade II Tube size: 7.5 mm Number of attempts: 1 Airway Equipment and Method: Video-laryngoscopy Placement Confirmation: ETT inserted through vocal cords under direct vision,  CO2 detector and Breath sounds checked- equal and bilateral Secured at: 22 cm Tube secured with: ETT holder Dental Injury: Teeth and Oropharynx as per pre-operative assessment       (including critical care time)  Medications Ordered in ED Medications - No data to display   Initial Impression / Assessment and Plan / ED Course  I have reviewed the triage vital signs and  the nursing notes.  Pertinent labs & imaging results that were available during my care of the patient were reviewed by me and considered in my medical decision making (see chart for details).  Clinical Course as of Dec 04 1955  Sat Dec 03, 2017  1045 Patient with a M1 occlusion per neurology she is on any and is anticipated to go for angio for thrombectomy   [MB]  1106 Patient is being emergently taken to the [facility] suite.   [MB]    Clinical Course User Index [MB] Hayden Rasmussen, MD    Elderly patient with multiple medical problems presenting today with signs concerning for fluid overload.  Patient has decreased breath sides in the right side with concern for pleural effusion with a recent history of a thoracentesis, lower extremity edema and abdominal distention.  Patient states she recently stopped taking Lasix and was told to take torsemide and metolazone.  She has not started the metolazone because she could not get it at the pharmacy until today.  Patient came to the hospital today because she is unable to take more than 2 steps without becoming short of breath.  She is also having orthopnea.  She is having a nonproductive cough and denies any  infectious symptoms.  She has no unilateral leg pain or swelling.  She does note that she has a weak heart and kidney problems but still working on getting outside medical records.  Patient recently discharged from [facility] 3 days ago.  Patient placed on 4 L of oxygen with improvement of shortness of breath. Lower suspicion today for PE, dissection or MI.  Patient does have a history of breast cancer so it could be a malignant effusion versus CHF from cardiomyopathy or renal failure. CBC, CMP, BNP, troponin, chest x-ray pending.  At this time patient does not appear to need BiPAP.  At approximately 835 patient became unresponsive with a left-sided gaze, flaccid paralysis on the right upper and lower extremity and unable to speak.  On evaluation patient's left pupil was pinpoint in the right was approximately 4 cm and not significantly reactive.  Patient would open her eyes and look to voice just to the left but had right-sided neglect.  A code stroke was called as the patient's blood sugar was 190.  Patient went to CT which showed chronic disease but no acute findings.  Tele-neurology was consulted however given patient's recent GI bleed she was not a candidate for TPA.  Difficult to find access but IV access finally established.  Because of patient's mental status and somewhat labored breathing decision was made to intubate the patient.  She only required etomidate/rocuronium for intubation without complication.  She was then given fentanyl.  Chest x-ray with reaccumulation of right pleural effusion with some atelectasis.  ABG performed.  Spoke with critical care and neurology and patient will be transported emergently to [facility] to receive CTA and CT perfusion.  CRITICAL CARE Performed by: Johnpaul Gillentine Total critical care time: 60 minutes Critical care time was exclusive of separately billable procedures and treating other patients. Critical care was necessary to treat or prevent imminent or  life-threatening deterioration. Critical care was time spent personally by me on the following activities: development of treatment plan with patient and/or surrogate as well as nursing, discussions with consultants, evaluation of patient's response to treatment, examination of patient, obtaining history from patient or surrogate, ordering and performing treatments and interventions, ordering and review of laboratory studies, ordering and review of  radiographic studies, pulse oximetry and re-evaluation of patient's condition.   Final Clinical Impressions(s) / ED Diagnoses   Final diagnoses:  Cerebrovascular accident (CVA), unspecified mechanism ([place])  Acute on chronic congestive heart failure, unspecified heart failure type ([place])  Recurrent right pleural effusion    ED Discharge Orders    None       Blanchie Dessert, MD 12/03/17 1049    Blanchie Dessert, MD 12/03/17 [phone]

## 2017-12-03 NOTE — ED Notes (Signed)
Report given to Caleb with [facility]. 

## 2017-12-03 NOTE — Anesthesia Postprocedure Evaluation (Signed)
Anesthesia Post Note  Patient: Ana Gardner  Procedure(s) Performed: CODE STROKE (N/A )     Patient location during evaluation: PACU Anesthesia Type: General Level of consciousness: sedated and patient remains intubated per anesthesia plan Pain management: pain level controlled Vital Signs Assessment: post-procedure vital signs reviewed and stable Respiratory status: patient remains intubated per anesthesia plan and patient on ventilator - see flowsheet for VS Cardiovascular status: stable Postop Assessment: no apparent nausea or vomiting Anesthetic complications: no    Last Vitals:  Vitals:   12/03/17 1408 12/03/17 1423  BP: 114/74 117/67  Pulse:    Resp: 16 16  Temp:    SpO2:      Last Pain:  Vitals:   12/03/17 1323  TempSrc: Temporal                 Yitzchok Carriger COKER

## 2017-12-03 NOTE — ED Triage Notes (Signed)
Patient states that she has been getting progressively SOB since she left the hospital 2 weeks ago. She has been unable to walk now without getting SOB

## 2017-12-03 NOTE — ED Notes (Signed)
[facility] here to transport pt 

## 2017-12-03 NOTE — Anesthesia Procedure Notes (Signed)
Arterial Line Insertion Start/End5/10/2017 11:37 AM, 12/03/2017 11:37 AM Performed by: Inda Coke, CRNA, CRNA  Patient location: OOR procedure area. Preanesthetic checklist: patient identified, IV checked, site marked, risks and benefits discussed, surgical consent, monitors and equipment checked, pre-op evaluation, timeout performed and anesthesia consent Lidocaine 1% used for infiltration Left, radial was placed Catheter size: 20 G Hand hygiene performed  and maximum sterile barriers used  Allen's test indicative of satisfactory collateral circulation Attempts: 1 Procedure performed without using ultrasound guided technique. Ultrasound Notes:anatomy identified Following insertion, dressing applied and Biopatch. Post procedure assessment: normal  Patient tolerated the procedure well with no immediate complications.

## 2017-12-03 NOTE — ED Notes (Signed)
Pt unresponsive, Dr Maryan Rued at bedside

## 2017-12-03 NOTE — ED Provider Notes (Signed)
77 year old female transferred from [place] for concerns of an acute stroke.  Patient initially presented to [facility] complaining of a couple days of increased shortness of breath.  She had a recent admission to [facility] for same.  He is a history of A. fib was on Eliquis but was taken off it for a GI bleed.  Sounds like it about a 30 or potentially after she had acute onset of right-sided paralysis.  She was emergently intubated at [facility] for hypoxia and had a Noncon CT which was negative for acute changes.  Neurology was consulted and she was emergently transported here for further evaluation.  She is immediately going  2 CT to get an angios head and neck.  She would not be a TPA candidate with a recent GI bleed.  Clinical Course as of Dec 05 749  Sat Dec 03, 2017  1045 Patient with a M1 occlusion per neurology she is on any and is anticipated to go for angio for thrombectomy   [MB]  1106 Patient is being emergently taken to the [facility] suite.   [MB]    Clinical Course User Index [MB] Hayden Rasmussen, MD      Hayden Rasmussen, MD 12/05/17 [phone]

## 2017-12-03 NOTE — H&P (Signed)
PULMONARY / CRITICAL CARE MEDICINE   Name: Ana Gardner MRN: [id number] DOB: 30-Sep-1940    ADMISSION DATE:  12/03/2017 CONSULTATION DATE:  12/03/2017  REFERRING MD:  Estanislado Pandy   CHIEF COMPLAINT:  Right sided weakness   HISTORY OF PRESENT ILLNESS:   Ana Gardner is a 77 yr old lady with multiple comorbidities including CHF with right sided heart feailure who was just discharged from [facility] for fluid overload, pleural effusion s/p thoracentesis coming in from [place] [facility] after complaining of shortness of breath. During her stay in the ED she started having right sided weakness and initial CT brain was negative but I was called and accepted the patient immediately for  A code stroke and CTA at [facility] which she did get here in the ED and was showing a left MCA stroke. Neurology are on board and she will be taken to the IR for intervention.   Patient is sedated and ventilated and obviously I could not take any history from her due to that and no family members around.   PAST MEDICAL HISTORY :  She  has a past medical history of Cancer ([place]), CHF (congestive heart failure) ([place]), Diabetes mellitus without complication ([place]), and Sarcoidosis.    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   PAST SURGICAL HISTORY: She  has a past surgical history that includes Mastectomy (Left, 2004) and PACEMAKER IMPLANT.  Not on File  No current facility-administered medications on  file prior to encounter.    Current Outpatient Medications on File Prior to Encounter  Medication Sig  . amiodarone (PACERONE) 200 MG tablet   . ELIQUIS 5 MG TABS tablet   . metolazone (ZAROXOLYN) 2.5 MG tablet   . mupirocin ointment (BACTROBAN) 2 %   . pantoprazole (PROTONIX) 40 MG tablet   . torsemide (DEMADEX) 20 MG tablet     FAMILY HISTORY:  Her has no family status information on file.    SOCIAL HISTORY:  Could not be obtained due to sedation   REVIEW OF SYSTEMS:   Could not be obtained due to sedation     VITAL SIGNS: BP (!) 155/99 (BP Location: Left Arm)   Pulse 80   Temp 97.6 F (36.4 C) (Axillary)   Resp 11   Ht 5\' 6"  (1.676 m)   SpO2 100%   HEMODYNAMICS:    VENTILATOR SETTINGS: Vent Mode: PRVC FiO2 (%):  [100 %] 100 % Set Rate:  [16 bmp] 16 bmp Vt Set:  [480 mL] 480 mL Plateau Pressure:  [20 cmH20] 20 cmH20  INTAKE / OUTPUT: No intake/output data recorded.  PHYSICAL EXAMINATION: General:  Acutely illl sedated  Neuro: right pupil 92mm sluggish left 52mm reactive  HEENT:  Intubated  Cardiovascular:  Normal heart sounds no murmurs  Lungs: clear decreased air sounds rt>left no wheezing no crackles  Abdomen:  Soft no tenderness  Musculoskeletal:  ++ edema lower limbs  Skin:  No rash  LABS:  BMET Recent Labs  Lab 12/03/17 0909  NA 128*  K 4.2  CL 94*  CO2  21*  BUN 51*  CREATININE 2.04*  GLUCOSE 137*    Electrolytes Recent Labs  Lab 12/03/17 0909  CALCIUM 8.4*    CBC Recent Labs  Lab 12/03/17 0909  WBC 7.1  HGB 10.1*  HCT 28.6*  PLT 432*    Coag's No results for input(s): APTT, INR in the last 168 hours.  Sepsis Markers No results for input(s): LATICACIDVEN, PROCALCITON, O2SATVEN in the last 168 hours.  ABG Recent Labs  Lab 12/03/17 0942  PHART 7.546*  PCO2ART 32.0  PO2ART 140.0*    Liver Enzymes Recent Labs  Lab 12/03/17 0909  AST 19  ALT 10*  ALKPHOS 48  BILITOT 1.0  ALBUMIN 2.8*    Cardiac  Enzymes Recent Labs  Lab 12/03/17 0909  TROPONINI <0.03    Glucose Recent Labs  Lab 12/03/17 0839 12/03/17 0916 12/03/17 1010  GLUCAP 149* 118* 113*    Imaging Dg Chest Portable 1 View  Result Date: 12/03/2017 CLINICAL DATA:  Respiratory failure and status post intubation. Status post left thoracentesis on 11/29/2017 and right thoracentesis on 11/28/2017. EXAM: PORTABLE CHEST 1 VIEW COMPARISON:  11/29/2017 and 11/25/2017 chest x-rays at [facility] FINDINGS: Stable cardiac enlargement and appearance of dual-chamber pacemaker. Endotracheal tube present with the tip approximately 2 cm above the carina. Since the prior chest x-ray, there is significant volume loss of the right lung with likely component recurrent pleural fluid as well as consolidation/atelectasis. The left lung shows no evidence of airspace disease, edema or pleural fluid. No pneumothorax identified. IMPRESSION: 1. Endotracheal tube tip approximately 2 cm above the carina. 2. Significant volume loss of right lung since prior chest x-ray with recurrent right pleural effusion as well as underlying consolidation/atelectasis of the right lung. Electronically Signed   By: Aletta Edouard M.D.   On: 12/03/2017 09:55   Ct Head Code Stroke Wo Contrast  Result Date: 12/03/2017 CLINICAL DATA:  Code stroke. Acute onset of unresponsiveness while in the emergency department. Patient is known nonverbal. Acute onset of right-sided weakness. EXAM: CT HEAD WITHOUT CONTRAST TECHNIQUE: Contiguous axial images were obtained from the base of the skull through the vertex without intravenous contrast. COMPARISON:  None. FINDINGS: Brain: No acute infarct, hemorrhage, or mass lesion is present. 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 Hypodense infarct is present in the high right parietal lobe, likely also remote. A remote infarct of the left frontal operculum is  present. Associated volume loss is noted. No acute left-sided infarct is present. No acute hemorrhage or mass lesion is present. Remote infarcts are present posteriorly within the left cerebellum. Brainstem is unremarkable. No significant extra-axial fluid collection is present. No significant extra-axial fluid collection is present. Vascular: Atherosclerotic calcifications are present within the internal carotid arteries bilaterally. There is no hyperdense vessel. Skull: Calvarium is intact. No focal lytic or blastic lesions are present. Sinuses/Orbits: The paranasal sinuses are clear. A remote right orbital blowout fracture is present. A right lens replacement is present. Globes and orbits are otherwise within normal limits. ASPECTS [facility] Stroke Program Early CT Score) - Ganglionic level infarction (caudate, lentiform nuclei, internal capsule, insula, M1-M3 cortex): 7/7 - Supraganglionic infarction (M4-M6 cortex): 3/3. Total score (0-10 with 10 being normal): 10/10. IMPRESSION: 1. Infarcts involving the high right parietal lobe and left frontal operculum appear remote. 2. Remote lacunar infarct of the left caudate head. 3. Remote infarcts of the posterior left cerebellum. 4. No acute infarct. 5. Diffuse  white matter disease likely reflects the sequelae of chronic microvascular ischemia. 6. ASPECTS is 10/10 These results were called by telephone at the time of interpretation on 12/03/2017 at 9:03 am to Dr. Blanchie Dessert , who verbally acknowledged these results. Electronically Signed   By: San Morelle M.D.   On: 12/03/2017 09:08     STUDIES:  CTA Brain >>   SIGNIFICANT EVENTS: Intubated 12/03/2017      ASSESSMENT / PLAN:  PULMONARY A: Acute hypoxemic respiratory failure Acute pulm edema Right pleural effusion Hx os pulm sarcoid    P:   - lasix bid - consider thoracentesis if not improve with diuresis  - vent bundle - nebs prn  CARDIOVASCULAR A:  Acute diastolic CHF  Acute  pulm edema paroxysmal afib off anticoagulation   P:  - anticoagulation as per neuro   RENAL A:   AKI  Hyponatremia due to fluid overload  P:   -diurese - follow BMP and Na levels - foley cath   GASTROINTESTINAL A:   Hx of portal HTN Non ETOH liver cirrhosis   Hx of recent GI bleed   P:   PPI prophylactic  Anticoagulation on hold for recent GI bleed   HEMATOLOGIC A:   Hx of blood loss anemia  P:  - trend CBC  - SCD for DVT prophylacis   INFECTIOUS A:   No issues  P:   No ABx   ENDOCRINE A:   DM with hyperglycemia    P:   SSI   NEUROLOGIC A:   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

## 2017-12-03 NOTE — Transfer of Care (Signed)
Immediate Anesthesia Transfer of Care Note  Patient: Ana Gardner  Procedure(s) Performed: CODE STROKE (N/A )  Patient Location: PACU  Anesthesia Type:General  Level of Consciousness: Patient remains intubated per anesthesia plan  Airway & Oxygen Therapy: Patient remains intubated per anesthesia plan and Patient placed on Ventilator (see vital sign flow sheet for setting)  Post-op Assessment: Post -op Vital signs reviewed and stable  Post vital signs: stable  Last Vitals:  Vitals Value Taken Time  BP 116/63 12/03/2017  1:23 PM  Temp    Pulse    Resp 16 12/03/2017  1:29 PM  SpO2    Vitals shown include unvalidated device data.  Last Pain:  Vitals:   12/03/17 1038  TempSrc: Axillary         Complications: No apparent anesthesia complications

## 2017-12-03 NOTE — Anesthesia Preprocedure Evaluation (Addendum)
Anesthesia Evaluation  Patient identified by MRN, date of birth, ID band Patient unresponsive    Reviewed: Allergy & Precautions, NPO status , Patient's Chart, lab work & pertinent test results  Airway Mallampati: Intubated       Dental   Pulmonary   BS diminished on Right. ETT position OK on CXR   + decreased breath sounds      Cardiovascular  Rhythm:Regular Rate:Normal     Neuro/Psych    GI/Hepatic   Endo/Other  diabetes  Renal/GU      Musculoskeletal   Abdominal   Peds  Hematology   Anesthesia Other Findings   Reproductive/Obstetrics                            Anesthesia Physical Anesthesia Plan  ASA: IV and emergent  Anesthesia Plan: General   Post-op Pain Management:    Induction: Intravenous  PONV Risk Score and Plan:   Airway Management Planned: Oral ETT  Additional Equipment: Arterial line  Intra-op Plan:   Post-operative Plan: Post-operative intubation/ventilation  Informed Consent: I have reviewed the patients History and Physical, chart, labs and discussed the procedure including the risks, benefits and alternatives for the proposed anesthesia with the patient or authorized representative who has indicated his/her understanding and acceptance.     Plan Discussed with: CRNA and Anesthesiologist  Anesthesia Plan Comments:         Anesthesia Quick Evaluation

## 2017-12-03 NOTE — Progress Notes (Signed)
Pt transported to PACU from IR on vent. Pt wol well. No complications. Will cont to monitor

## 2017-12-03 NOTE — Consult Note (Signed)
TeleNeurology Consult Note  Ana Gardner     Consulting Physician: Clabe Seal Code Status: No Order Primary Care Physician:  Karleen Hampshire., MD  DOB:  12/21/1940   Age: 77 y.o.  Date of Service: Dec 03, 2017 Admit Date:  12/03/2017   Admitting Diagnosis: <principal problem not specified>  Subjective:  Reason for Consultation: stroke  Chief Complaint: shortness of breath  History of present illness: 77 y/o woman who presented to the ED with shortness of breath. She was recently discharged for CHF exacerbation. Patient became acutely aphasic and right hemiplegic in the ED. She has h/o atrial fibrllation, but was taken off her anticoagulants due to GI bleeding. Emergent telestroke consult requested. Patient not clinically stable for CTA at this time given her respiratory.   Review of Systems  There are no active problems to display for this patient.  Past Medical History:  Diagnosis Date  . Cancer (HCC)    B/L  . CHF (congestive heart failure) ([place])   . Diabetes mellitus without complication ([place])   . Sarcoidosis    Past Surgical History:  Procedure Laterality Date  . MASTECTOMY Left 2004  . PACEMAKER IMPLANT     Allergies not on file  Social History   Socioeconomic History  . Marital status: Divorced    Spouse name: Not on file  . Number of children: Not on file  . Years of education: Not on file  . Highest education level: Not on file  Occupational History  . Not on file  Social Needs  . Financial resource strain: Not on file  . Food insecurity:    Worry: Not on file    Inability: Not on file  . Transportation needs:    Medical: Not on file    Non-medical: Not on file  Tobacco Use  . Smoking status: Never Smoker  . Smokeless tobacco: Never Used  Substance and Sexual Activity  . Alcohol use: Never    Frequency: Never  . Drug use: Never  . Sexual activity: Not on file  Lifestyle  . Physical activity:    Days per week: Not on file    Minutes per session:  Not on file  . Stress: Not on file  Relationships  . Social connections:    Talks on phone: Not on file    Gets together: Not on file    Attends religious service: Not on file    Active member of club or organization: Not on file    Attends meetings of clubs or organizations: Not on file    Relationship status: Not on file  . Intimate partner violence:    Fear of current or ex partner: Not on file    Emotionally abused: Not on file    Physically abused: Not on file    Forced sexual activity: Not on file  Other Topics Concern  . Not on file  Social History Narrative  . Not on file   Family History History reviewed. No pertinent family history.    Objective:  Vital signs in last 24 hours:   No data recorded.    Intake/Output last 3 shifts: No intake/output data recorded.  Intake/Output this shift: No intake/output data recorded.  Physical Exam 1A: Level of Consciousness - Alert; keenly responsive 0 1B: Ask Month and Age - Aphasic +2 1C: 'Blink Eyes' & 'Squeeze Hands' - Performs 0 Tasks +2 2: Test Horizontal Extraocular Movements - Forced Gaze Palsy: Cannot Be Overcome +2 3: Test Visual Fields -  No Visual Loss 0 4: Test Facial Palsy - Minor paralysis (flat nasolabial fold, smile asymetry) +1 5A: Test Left Arm Motor Drift - Drift, but doesn't hit bed +1 5B: Test Right Arm Motor Drift - No Movement +4 6A: Test Left Leg Motor Drift - No Effort Against Gravity +3 6B: Test Right Leg Motor Drift - No Movement +4 7: Test Limb Ataxia - Does Not Understand 0 8: Test Sensation - Complete Loss: Cannot Sense Being Touched At All +2 9: Test Language/Aphasia - ute/Global Aphasia: No Usable Speech/Auditory Comprehensionute/Global Aphasia: No Usable Speech/Auditory Comprehension +3 10: Test Dysarthria - Mute/Anarthric +2 11: Test Extinction/Inattention - No abnormality 0 NIHSS 26  Diagnostic Findings:  Pertinent Labs: No results for input(s): WBC, MCV in the last 168  hours.  Invalid input(s): HEMATOCRIT, HEMOGLOBIN, PLATELETS No results for input(s): POTASSIUM, CHLORIDE, CALCIUM, BUN, GLUCOSE, CREATININE, CO2 in the last 168 hours.  Invalid input(s): SODIUM, ANION GAP2, GFR MDRD AF AMER No results for input(s): AST, ALT, ALBUMIN in the last 168 hours.  Invalid input(s): ALK PHOS, BILIRUBIN TOTAL, BILIRUBIN DIRECT, PROTEIN TOTAL, GLOBULIN No results for input(s): TRIG in the last 168 hours.  Invalid input(s): CHLPL, HDL PML, VLDL CALC, LDL CALC, CHOL/HDL RATIO, LDL/HDL RATIO, NON-HDL CHOLESTEROL Invalid input(s): CK TOTAL, TROPONIN I, CK MB No results for input(s): APTT in the last 168 hours. No results for input(s): INR in the last 168 hours.  Invalid input(s): PROTHROMBIN TIME  Extensive review of previous medical records completed.    Assessment: There are no active problems to display for this patient.    Plan: [facility] TeleNeurology Consult Services  Impression: Stroke     Differential Diagnosis:  1. Cardioembolic stroke  2. Small vessel disease/lacune    3. Thromboembolic, artery-to-artery mechanism 4. Hypercoagulable state-related infarct  5. Thrombotic mechanism, large artery disease 6. Transient ischemic attack  Comments: Last known well:    0830 Door [id number] [facility] contacted:   [id number] [facility] at bedside:   0854 NIHSS assessment [id number] CT head reviewed at 0856 Needle time:TPA cannot be given due to GI bleed     Discussion:     Our recommendations are outlined below.  Recommendations: Recommend advance neuroimaging when patient is stablized ASA if no contraindications, IV fluids and permissive hypertension (Keep SBP < 220/110) Neurochecks DVT prophylaxis    Follow up with Neurology for further testing and evaluation  Medical Decision Making:  - Extensive number of diagnosis or management options are considered above. - Extensive amount of complex data reviewed.  - High risk of  complication and/or morbidity or mortality are associated with differential diagnostic considerations above. - There may be uncertain outcome and increased probability of prolonged functional impairment or high probability of severe prolonged functional impairment associated with some of these differential diagnosis.  Medical Data Reviewed: 1.Data reviewed include clinical labs, radiology, Medical Tests; 2.Tests results discussed w/performing or interpreting physician;    3.Obtaining/reviewing old medical records; 4.Obtaining case history from another source;    5.Independent review of image, tracing or specimen.  Signed: _0 @   12/03/2017  8:56 AM

## 2017-12-03 NOTE — ED Notes (Signed)
Called the patients emergency contact who was upset that this RN was contacting him for further information about the patients daughter. The patient was screaming at this RN on the phone and was upset that this RN was bothering him. The ex husband was given a description of the patients condition and the gravity of the situation and he continued to scream at this RN. The patients ex husband was asked to contact the Daughter and to have her call [facility] to talk to someone about her mothers continued care. The ex husband states " [place] am [place] the middle [place] the [place] market and you are bothering me, Pricilla's number is at home and I will get to in when I can"

## 2017-12-03 NOTE — Progress Notes (Signed)
Patient ID: Ana Gardner, female   DOB: [birth year], 77 y.o.   MRN: [id number] INR. 60 y old F LSW 830 am baseline modified rankin score ?1 Acute onset of RT sided weakness and confusion,? Aphasia.  Intubated. CT Head NO ICH.ASPECTS 10  CTA Hand N occluded LT MCA sup division and ? Inf division. Case reviewed with neurologist.. Not eligible for IV TPA because of recent GI hemorrhage.Howevere patient candidate for endovascular revascularization to prevent further neurological injury. Emergency consent of 2 MDs obtained as no family available or reachable. S.Marcell Pfeifer  MD

## 2017-12-03 NOTE — Progress Notes (Signed)
Code Stroke called at [facility] for 77 y/o woman who presented to the ED with shortness of breath. History of atrial fibrllation, but was taken off her anticoagulants due to GI bleeding,nonalcoholic cirrhosis, CHF, Mastectomy, Pacemaker, DM .Telestroke consult done at [facility]. She was recently discharged for CHF exacerbation. Patient arrived to [facility] with complaints of worsening SOB and became acutely aphasic and right hemiplegic at 0830. Non Contrast CT completed at [facility]. Per [facility] report Pt intubated with Rocuronium and Fentanyl at [facility]. Pt transported emergently to [facility] to receive CTA and CT perfusion. Not a TPA candidate due to recent GI Bleed. After CTA/CT completed and reviewed per Neurologist Dr. Leonel Ramsay positive for acute stroke, IR MD consulted for intervention. NIHSS completed yielding 28.  Pt left pupil was pinpoint in the right was approximately 4 cm non reactive bilateral. Pt taken to IR and care assumed per IR staff.

## 2017-12-03 NOTE — ED Notes (Signed)
ED Provider at bedside. 

## 2017-12-03 NOTE — ED Notes (Signed)
Placed on 9:25  PRVC RR 16, VT 500, Peep 5.0.  #7.5 @ 22 cm left gum line. PIP26, Ve8.3L, Vte 524. ETCO2+, BBS +, called charge RT a Zacarias Pontes for report.

## 2017-12-03 NOTE — Progress Notes (Signed)
SLP Cancellation Note  Patient Details Name: Ana Gardner MRN: [id number] DOB: May 15, 1941   Cancelled treatment:        Pt not medically ready. Intubated. Will follow up.  Deneise Lever, [place], [place] Speech-Language Pathologist [place] 12/03/2017, 10:42 AM

## 2017-12-03 NOTE — Progress Notes (Signed)
Dr. Leonel Ramsay aware of pt's post IR assessment and arrival on unit. No changes at this time.

## 2017-12-03 NOTE — Progress Notes (Signed)
Spoke with Dr. Leonel Ramsay regarding NS rate. He would like to defer to CCM MD. Paged CCM. Dr. Elwyn Reach is okay with keeping NS at 1. If needed, pt can have Neo ordered for BP management.

## 2017-12-03 NOTE — Procedures (Signed)
S/P lt common carotid arterioogram followed by complete revascularization of occluded sup division of Lt MCA with x3 passes with embotrap 5 mm x 33 mm retrieval device achieving a TICI 3 reperfusion.

## 2017-12-04 ENCOUNTER — Inpatient Hospital Stay (HOSPITAL_COMMUNITY): Payer: Medicare HMO

## 2017-12-04 ENCOUNTER — Encounter (HOSPITAL_COMMUNITY): Payer: Self-pay | Admitting: Certified Registered"

## 2017-12-04 DIAGNOSIS — I361 Nonrheumatic tricuspid (valve) insufficiency: Secondary | ICD-10-CM

## 2017-12-04 DIAGNOSIS — I480 Paroxysmal atrial fibrillation: Secondary | ICD-10-CM

## 2017-12-04 DIAGNOSIS — K766 Portal hypertension: Secondary | ICD-10-CM

## 2017-12-04 DIAGNOSIS — I50813 Acute on chronic right heart failure: Secondary | ICD-10-CM

## 2017-12-04 DIAGNOSIS — K922 Gastrointestinal hemorrhage, unspecified: Secondary | ICD-10-CM

## 2017-12-04 DIAGNOSIS — I371 Nonrheumatic pulmonary valve insufficiency: Secondary | ICD-10-CM

## 2017-12-04 DIAGNOSIS — K3189 Other diseases of stomach and duodenum: Secondary | ICD-10-CM

## 2017-12-04 LAB — BASIC METABOLIC PANEL
ANION GAP: 8 (ref 5–15)
Anion gap: 11 (ref 5–15)
BUN: 39 mg/dL — AB (ref 6–20)
BUN: 29 mg/dL — ABNORMAL HIGH (ref 6–20)
CALCIUM: 8.1 mg/dL — AB (ref 8.9–10.3)
CO2: 23 mmol/L (ref 22–32)
CO2: 24 mmol/L (ref 22–32)
CREATININE: 1.98 mg/dL — AB (ref 0.44–1.00)
CREATININE: 1.55 mg/dL — AB (ref 0.44–1.00)
Calcium: 7.9 mg/dL — ABNORMAL LOW (ref 8.9–10.3)
Chloride: 99 mmol/L — ABNORMAL LOW (ref 101–111)
Chloride: 101 mmol/L (ref 101–111)
GFR calc Af Amer: 27 mL/min — ABNORMAL LOW (ref >60)
GFR calc Af Amer: 37 mL/min — ABNORMAL LOW (ref >60)
GFR, EST NON AFRICAN AMERICAN: 24 mL/min — AB (ref >60)
GFR, EST NON AFRICAN AMERICAN: 32 mL/min — AB (ref >60)
GLUCOSE: 177 mg/dL — AB (ref 65–99)
Glucose, Bld: 174 mg/dL — ABNORMAL HIGH (ref 65–99)
POTASSIUM: 4 mmol/L (ref 3.5–5.1)
Potassium: 3.4 mmol/L — ABNORMAL LOW (ref 3.5–5.1)
SODIUM: 133 mmol/L — AB (ref 135–145)
Sodium: 133 mmol/L — ABNORMAL LOW (ref 135–145)

## 2017-12-04 LAB — LIPID PANEL
Cholesterol: 70 mg/dL (ref 0–200)
HDL: 18 mg/dL — AB (ref >40)
LDL CALC: 38 mg/dL (ref 0–99)
Total CHOL/HDL Ratio: 3.9 RATIO
Triglycerides: 72 mg/dL (ref <150)
VLDL: 14 mg/dL (ref 0–40)

## 2017-12-04 LAB — ECHOCARDIOGRAM COMPLETE
HEIGHTINCHES: 66 in
Weight: 2860.69 oz

## 2017-12-04 LAB — CBC WITH DIFFERENTIAL/PLATELET
BASOS ABS: 0 10*3/uL (ref 0.0–0.1)
Basophils Relative: 0 %
Eosinophils Absolute: 0 10*3/uL (ref 0.0–0.7)
Eosinophils Relative: 0 %
HEMATOCRIT: 28.1 % — AB (ref 36.0–46.0)
Hemoglobin: 9.1 g/dL — ABNORMAL LOW (ref 12.0–15.0)
LYMPHS ABS: 1 10*3/uL (ref 0.7–4.0)
Lymphocytes Relative: 5 %
MCH: 22.6 pg — ABNORMAL LOW (ref 26.0–34.0)
MCHC: 32.4 g/dL (ref 30.0–36.0)
MCV: 69.7 fL — AB (ref 78.0–100.0)
MONO ABS: 2.1 10*3/uL — AB (ref 0.1–1.0)
Monocytes Relative: 11 %
NEUTROS ABS: 15.9 10*3/uL — AB (ref 1.7–7.7)
Neutrophils Relative %: 84 %
PLATELETS: 408 10*3/uL — AB (ref 150–400)
RBC: 4.03 MIL/uL (ref 3.87–5.11)
RDW: 18.7 % — AB (ref 11.5–15.5)
WBC: 19 10*3/uL — ABNORMAL HIGH (ref 4.0–10.5)

## 2017-12-04 LAB — HEMOGLOBIN A1C
Hgb A1c MFr Bld: 8.6 % — ABNORMAL HIGH (ref 4.8–5.6)
Mean Plasma Glucose: 200.12 mg/dL

## 2017-12-04 LAB — PROTIME-INR
INR: 1.85
PROTHROMBIN TIME: 21.2 s — AB (ref 11.4–15.2)

## 2017-12-04 MED ORDER — SODIUM CHLORIDE 0.9% FLUSH
10.0000 mL | Freq: Two times a day (BID) | INTRAVENOUS | Status: DC
  Administered 2017-12-04 – 2017-12-05 (×2): 10 mL
  Administered 2017-12-05: 30 mL
  Administered 2017-12-06: 10 mL
  Administered 2017-12-06: 30 mL
  Administered 2017-12-08 – 2017-12-10 (×5): 10 mL

## 2017-12-04 MED ORDER — FENTANYL 2500MCG IN NS 250ML (10MCG/ML) PREMIX INFUSION
0.0000 ug/h | INTRAVENOUS | Status: DC
  Administered 2017-12-04: 25 ug/h via INTRAVENOUS
  Filled 2017-12-04: qty 250

## 2017-12-04 MED ORDER — SODIUM CHLORIDE 0.9 % IV SOLN: INTRAVENOUS | Status: DC | PRN

## 2017-12-04 MED ORDER — MIDAZOLAM HCL 2 MG/2ML IJ SOLN
INTRAMUSCULAR | Status: AC
  Filled 2017-12-04: qty 2

## 2017-12-04 MED ORDER — FENTANYL CITRATE (PF) 100 MCG/2ML IJ SOLN
INTRAMUSCULAR | Status: AC
  Administered 2017-12-04: 100 ug
  Filled 2017-12-04: qty 2

## 2017-12-04 MED ORDER — CHLORHEXIDINE GLUCONATE CLOTH 2 % EX PADS
6.0000 | MEDICATED_PAD | Freq: Every day | CUTANEOUS | Status: DC
  Administered 2017-12-04 – 2017-12-06 (×3): 6 via TOPICAL

## 2017-12-04 MED ORDER — POTASSIUM CHLORIDE 10 MEQ/100ML IV SOLN
10.0000 meq | INTRAVENOUS | Status: AC
  Administered 2017-12-04 – 2017-12-05 (×4): 10 meq via INTRAVENOUS
  Filled 2017-12-04 (×4): qty 100

## 2017-12-04 MED ORDER — DEXTROSE 5 % IV SOLN
0.0000 ug/min | INTRAVENOUS | Status: DC
  Administered 2017-12-04: 25 ug/min via INTRAVENOUS
  Administered 2017-12-05: 19 ug/min via INTRAVENOUS
  Filled 2017-12-04 (×3): qty 16

## 2017-12-04 MED ORDER — SODIUM CHLORIDE 0.9% FLUSH: 10.0000 mL | INTRAVENOUS | Status: DC | PRN

## 2017-12-04 MED ORDER — FENTANYL CITRATE (PF) 100 MCG/2ML IJ SOLN
INTRAMUSCULAR | Status: AC
  Filled 2017-12-04: qty 2

## 2017-12-04 NOTE — Progress Notes (Signed)
Patient remains on full vent support due to hemodynamic instability requiring two pressors at this time. Patient is very agitated at the moment despite Propofol at 20 mcg.

## 2017-12-04 NOTE — Procedures (Signed)
Central Venous Catheter Insertion Procedure Note CARLIA BOMKAMP [id number] 07-Sep-1940  Procedure: Insertion of Central Venous Catheter Indications: Assessment of intravascular volume, Drug and/or fluid administration and Frequent blood sampling  Procedure Details Consent: Risks of procedure as well as the alternatives and risks of each were explained to the (patient/caregiver).  Consent for procedure obtained. Time Out: Verified patient identification, verified procedure, site/side was marked, verified correct patient position, special equipment/implants available, medications/allergies/relevent history reviewed, required imaging and test results available.  Performed  Maximum sterile technique was used including antiseptics, cap, gloves, gown, hand hygiene, mask and sheet. Skin prep: Chlorhexidine; local anesthetic administered A antimicrobial bonded/coated triple lumen catheter was placed in the right internal jugular vein to 16 cm using the Seldinger technique.  X1 stick, biopatch applied, occlusive dressing in place.    Evaluation Blood flow good Complications: No apparent complications Patient tolerated the procedure well. Chest X-ray ordered to verify placement.  CXR: pending.  Procedure performed under direct supervision of Dr. Marcello Moores and with ultrasound guidance for real time vessel cannulation.      Noe Gens, NP-C [place] [facility] Pgr: [phone] or if no answer [phone] 12/04/2017, 10:29 AM

## 2017-12-04 NOTE — Progress Notes (Signed)
PT Cancellation Note  Patient Details Name: Ana Gardner MRN: [id number] DOB: 05/21/1941   Cancelled Treatment:    Reason Eval/Treat Not Completed: Patient not medically ready Will follow up as time permits   Duncan Dull 12/04/2017, 9:28 AM Alben Deeds, PT DPT  Board Certified Neurologic Specialist [phone]

## 2017-12-04 NOTE — Progress Notes (Signed)
Patient transported to CT and back to 4N-23 without incident.

## 2017-12-04 NOTE — Progress Notes (Signed)
Patient has a pacemaker.  RN informed at this time there is no information on the pacemaker and MRI can not be done until it is received and researched. Will also contact ordering Doctor.

## 2017-12-04 NOTE — Progress Notes (Signed)
CXR post central line reviewed, line in good position.  No complications.  OK for use.    Noe Gens, NP-C [place] [facility] Pgr: [phone] or if no answer [phone] 12/04/2017, 11:14 AM

## 2017-12-04 NOTE — Progress Notes (Signed)
I was asked by day PCCM Dr Marcello Moores to evaluate this patient for possible thoracentesis or chest tube. On chart review she has hx Sarcoidosis, Right-sided heart failure, dCHF, Afib, DM, CKD, HTN, NASH cirrhosis with portal gastropathy, and left breast cancer, who was admitted with SOB and BLE edema, then developed acute CVA with Left M2 occlusion, s/p thrombectomy, remains with VDRF. 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 As such would not want to place chest tube if possible as these will never stop draining and thus making it difficult to then remove chest tube. Could consider thoracentesis. Will check INR first. However, patient is diuresing well and not requiring high vent settings. Therefore may want to just continue diuresis. Will repeat BMP now given the diuresis. Was also on NS @ 75cc/hr per RN up until recently, which was likely our diuresis efforts. Follow-up BMP and INR then decide on thoracentesis vs continued diuresis attempts without procedure.

## 2017-12-04 NOTE — H&P (Signed)
PULMONARY / CRITICAL CARE MEDICINE   Name: Ana Gardner MRN: [id number] DOB: 10-Apr-1941    ADMISSION DATE:  12/03/2017 CONSULTATION DATE:  12/03/2017  REFERRING MD:  Estanislado Pandy   CHIEF COMPLAINT:  Right sided weakness   HISTORY OF PRESENT ILLNESS:   77 y.o. 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  Patient walked into to the [facility] ED around 8am today with complaints of shortness of breath and edema in her bilateral lower extremities over the last month.    patient was discharged 11/29/17, with complaints of shortness of breath secondary to bilateral pleural effusion -right more than left, likely related to hypervolemic state from congestive heart failure and cirrhosis of liver. Ultrasound guided thoracentesis done 11/28/2017 with 1 L of pleural fluid drained right and another thoracentesis done on 11/29/2017 650 mL of fluid with drained from the left pleural space. She was started on torsemide and metolazone.  Patient presented to the ER, x-ray of the chest showed large recurrent right pleural effusion as well as underlying consolidation/atelectasis of the right lung.  Today around 8:30 AM in the ED patient became acutely aphasic with dense right hemiplegia as well as forced left gaze deviation.  Emergent telemetry stroke was consulted at that time patient had NIHSS of 26.  Immediate CT Noncon was done that shows infarct involving the high right parietal lobe  and left frontal operculum.  Patient could not undergo CT angiogram at that time due to respiratory status.  , she was unable to follow commands or protect her airway and was intubated immediately. Patient was transferred immediately to [facility] for further stroke management.  On arrival to [facility] patient immediately went to CT.  CT head/neck Angio  Showed proximal posterior division left M2 occlusion, More distal anterior division left M2/M3 occlusion as well aso occluded left vertebral artery with minimal reconstitution in the neck.  Neuro interventionalist was consulted immediately for endovascular revascularization  Patient underwent and underwent revascularization of the left MCA.  Patient brought back to the ICU intubated.   PAST MEDICAL HISTORY :  She  has a past medical history of Cancer ([place]), CHF (congestive heart failure) ([place]), Diabetes mellitus without complication ([place]), and Sarcoidosis.    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   PAST SURGICAL HISTORY: She  has a past surgical history that includes Mastectomy (Left, 2004) and PACEMAKER IMPLANT.  Not on File  No current facility-administered medications on file prior to encounter.    Current Outpatient Medications on File Prior to Encounter  Medication Sig  . amiodarone (PACERONE) 200 MG tablet Take 200 mg by mouth daily.   Marland Kitchen  ELIQUIS 5 MG TABS tablet Take 5 mg by mouth 2 (two) times daily.    . metolazone (ZAROXOLYN) 2.5 MG tablet Take 2.5 mg by mouth. Twice weekly  . metoprolol succinate (TOPROL-XL) 25 MG 24 hr tablet Take 25 mg by mouth daily.  . pantoprazole (PROTONIX) 40 MG tablet Take 40 mg by mouth daily.   Marland Kitchen torsemide (DEMADEX) 20 MG tablet Take 20 mg by mouth daily.     FAMILY HISTORY:  Her has no family status information on file.    SOCIAL HISTORY:  Could not be obtained due to sedation   REVIEW OF SYSTEMS:   Could not be obtained due to sedation     VITAL SIGNS: BP (!) 139/59   Pulse 80   Temp 99.4 F (37.4 C) (Axillary)   Resp 16   Ht 5\' 6"  (1.676 m)   Wt 194 lb 10.7 oz (88.3 kg)   SpO2 100%   BMI 31.42 kg/m   HEMODYNAMICS:    VENTILATOR SETTINGS: Vent Mode: PRVC FiO2 (%):  [40 %-60 %] 40 % Set Rate:  [16 bmp] 16 bmp Vt Set:  [460 mL] 460 mL PEEP:  [5 cmH20] 5 cmH20 Plateau Pressure:  [17 cmH20-20 cmH20] 17 cmH20  INTAKE / OUTPUT: I/O last 3 completed shifts: In: 6803.1 [I.V.:6410.1; Other:93; IV Piggyback:300] Out: 4410 [Urine:4410]  PHYSICAL EXAMINATION: General: Elderly female appropriate for her age lying in bed in no acute distress Neuro: Patient is awake and alert and able to nod her head and answer questions.  Extraocular muscles are intact.  Pupils equal round react to light.  5 of 5 muscles in all 4 extremities. HEENT:  Intubated  Cardiovascular:  Normal heart sounds no murmurs  Lungs: clear decreased air sounds rt>left no wheezing no crackles  Abdomen:  Soft no tenderness  Musculoskeletal:  ++ edema lower limbs  Skin:  No rash  LABS:  BMET Recent Labs  Lab 12/03/17 0909 12/04/17 0417  NA 128* 133*  K 4.2 4.0  CL 94* 99*  CO2 21* 23  BUN 51* 39*  CREATININE 2.04* 1.98*  GLUCOSE 137* 177*    Electrolytes Recent Labs  Lab 12/03/17 0909 12/04/17 0417  CALCIUM 8.4* 7.9*    CBC Recent Labs  Lab 12/03/17 0909 12/04/17 0417  WBC 7.1 19.0*  HGB 10.1* 9.1*  HCT 28.6* 28.1*  PLT 432* 408*    Coag's No  results for input(s): APTT, INR in the last 168 hours.  Sepsis Markers No results for input(s): LATICACIDVEN, PROCALCITON, O2SATVEN in the last 168 hours.  ABG Recent Labs  Lab 12/03/17 0942 12/03/17 2341  PHART 7.546* 7.431  PCO2ART 32.0 34.0  PO2ART 140.0* 88.2    Liver Enzymes Recent Labs  Lab 12/03/17 0909  AST 19  ALT 10*  ALKPHOS 48  BILITOT 1.0  ALBUMIN 2.8*    Cardiac Enzymes Recent Labs  Lab 12/03/17 0909  TROPONINI <0.03    Glucose Recent Labs  Lab 12/03/17 0839 12/03/17 0916 12/03/17 1010  GLUCAP 149* 118* 113*    Imaging Ct Head Wo Contrast  Result Date: 12/04/2017 CLINICAL DATA:  Follow-up exam for acute stroke, status post catheter directed revascularization. EXAM: CT HEAD WITHOUT CONTRAST TECHNIQUE: Contiguous axial images were obtained from the base of the skull through the vertex without intravenous contrast. COMPARISON:  Prior CT from earlier the same day. FINDINGS: Brain: Previously noted hyperdensity at the level of the left insula again seen, measuring 10 x 12 x 14 mm (transverse by AP  by craniocaudad). Overall, this is similar in size with slightly decreased attenuation as compared to previous. Given persistence, hemorrhage is favored, although a degree of contrast staining may be contributory. Evolving acute left MCA territory infarcts within the adjacent all left frontal operculum and posterior frontal region again seen, slightly more apparent on current exam. No mass effect. No other acute intracranial hemorrhage. No other acute large vessel territory infarct. No mass lesion or midline shift. No hydrocephalus. No extra-axial fluid collection. Underlying atrophy with chronic small vessel ischemic changes again noted. Vascular: No new hyperdense vessel. Scattered vascular calcifications noted within the carotid siphons. Skull: No acute scalp soft tissue abnormality.  Calvarium intact. Sinuses/Orbits: Globes and orbital soft tissues within normal  limits. Small air-fluid levels noted within the sphenoid sinuses. Scattered mucosal thickening throughout the ethmoidal air cells and maxillary sinuses. Trace right mastoid effusion. Other: None. IMPRESSION: 1. Persistent hyperdensity at the left insula, similar in size measuring up to 14 mm with slightly decreased attenuation. Hemorrhage is favored. 2. Additional multifocal areas of evolving left MCA territory infarction, stable in distribution as compared to previous. 3. No other new acute intracranial abnormality. Electronically Signed   By: Jeannine Boga M.D.   On: 12/04/2017 14:29   Ct Head Wo Contrast  Result Date: 12/04/2017 CLINICAL DATA:  77 y/o F; stroke post catheter thrombectomy for follow-up. EXAM: CT HEAD WITHOUT CONTRAST TECHNIQUE: Contiguous axial images were obtained from the base of the skull through the vertex without intravenous contrast. COMPARISON:  11/04/2017 CT head, CTA head, angiogram head. FINDINGS: Brain: Amorphous density within the left anterior insula measuring 10 x 9 x 16 mm (AP x ML x CC series 6, image 17 and series 5, image 46). Hypoattenuation within the left lateral frontal lobe, frontal operculum, and insula. Small chronic infarcts in the posterior left cerebellum, left caudate head, right parietal lobe, and right frontal operculum. No hydrocephalus, extra-axial collection, or effacement of basilar cisterns. Vascular: Calcific atherosclerosis of carotid siphons. Skull: Normal. Negative for fracture or focal lesion. Sinuses/Orbits: Chronic right lamina papyracea fracture. Partial opacification of paranasal sinuses likely due to recent intubation. Other: None. IMPRESSION: 1. Amorphous density in left anterior insula measuring up to 16 mm, probably enhancing infarction, although hemorrhage is possible. Follow-up is recommended to ensure stability. 2. Additional area of hypoattenuation within left lateral frontal lobe, frontal operculum, insula compatible with evolving  infarction. 3. Multiple additional stable chronic infarcts as above. These results were called by telephone at the time of interpretation on 12/04/2017 at 5:31 am to Dr. Rory Percy, who verbally acknowledged these results. Electronically Signed   By: Kristine Garbe M.D.   On: 12/04/2017 05:33   Ct Chest Wo Contrast  Addendum Date: 12/04/2017   ADDENDUM REPORT: 12/04/2017 17:04 ADDENDUM: The upper abdominal section of the original report was inadvertently not included and is dictated below. UPPER ABDOMEN: Liver margin appears irregular. Decreased attenuation in the dome is likely due to dilated hepatic veins. Stones are seen in the gallbladder. Probable adrenal thickening. Visualized portions of the kidneys, spleen, pancreas and stomach are grossly unremarkable. Upper abdominal lymph nodes do not appear enlarged by CT size criteria. Ascites. IMPRESSION: 8. Marginal irregularity of the liver is indicative of cirrhosis. 9.       Cholelithiasis. 10.     Ascites. Electronically Signed   By: Lorin Picket M.D.   On: 12/04/2017 17:04   Result Date: 12/04/2017 CLINICAL DATA:  Pleural effusions. EXAM: CT CHEST WITHOUT CONTRAST TECHNIQUE: Multidetector CT imaging of the  chest was performed following the standard protocol without IV contrast. COMPARISON:  Chest radiograph 12/03/2017 and CT chest 01/01/2017. FINDINGS: Cardiovascular: Right IJ central line terminates in the SVC. Atherosclerotic calcification of the arterial vasculature, including coronary arteries. Pulmonary arteries are enlarged. Heart is moderately enlarged with a moderate pericardial effusion. Mediastinum/Nodes: Low internal jugular and mediastinal adenopathy measures up to 1.1 cm in the right paratracheal station. There are calcified mediastinal and bihilar lymph nodes. Distal periesophageal lymph node measures 11 mm, as before. No axillary adenopathy. Lungs/Pleura: Image quality is degraded by respiratory motion. Large right pleural effusion and  adjacent collapse/consolidation obscure the majority of the right hemithorax. Nodular areas in the apex of the left upper lobe are grossly stable. Scattered areas of peribronchovascular ground-glass in the left upper lobe, possibly infectious or inflammatory in etiology. Moderate left pleural effusion with collapse/consolidation in the left lower lobe. Endotracheal tube terminates at the orifice of the left mainstem bronchus. Musculoskeletal: Degenerative changes in the spine. No worrisome lytic or sclerotic lesions. IMPRESSION: 1. Endotracheal tube is low lying, at the orifice of the left mainstem bronchus. Pulling back 3-4 cm would likely better position the tip above the carina. 2. Large right pleural effusion with collapse/consolidation in the adjacent right lung. 3. Moderate left pleural effusion with collapse/consolidation in the left lower lobe. 4. Scattered areas of nodularity and ground-glass in the left upper lobe, poorly evaluated due to respiratory motion. 5. Moderate pericardial effusion. 6. Aortic atherosclerosis (ICD10-170.0). Coronary artery calcification. 7. Enlarged pulmonary arteries, indicative of pulmonary arterial hypertension. Electronically Signed: By: Lorin Picket M.D. On: 12/04/2017 16:47   Dg Chest Port 1 View  Result Date: 12/04/2017 CLINICAL DATA:  Central line placement EXAM: PORTABLE CHEST 1 VIEW COMPARISON:  Chest radiograph from earlier today. 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 Endotracheal tube tip is 2.4 cm above the carina. Right internal jugular central venous catheter terminates in the middle third of the SVC. Stable cardiomediastinal silhouette with mild cardiomegaly. No pneumothorax. Moderate right pleural effusion, decreased. Stable small left pleural effusion. Improved aeration in the right lung with persistent right greater than left bibasilar lung opacities, stable on the left and decreased on the right. No pulmonary edema.  IMPRESSION: 1. Right internal jugular central venous catheter terminates in the middle third of the SVC. Well-positioned endotracheal tube. 2. Moderate right pleural effusion, decreased. Improved aeration in the right lung. 3. Stable small left pleural effusion. 4. Right greater than left bibasilar lung opacities, decreased on the right and stable on the left. Electronically Signed   By: Ilona Sorrel M.D.   On: 12/04/2017 11:04   Dg Chest Port 1 View  Result Date: 12/04/2017 CLINICAL DATA:  Respiratory failure EXAM: PORTABLE CHEST 1 VIEW COMPARISON:  Chest radiograph from one day prior. FINDINGS: Right rotated chest radiograph. Endotracheal tube tip is 1.9 cm above the carina. 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 Stable cardiomediastinal silhouette with mild cardiomegaly. No pneumothorax. Worsening essentially complete opacification of the right hemithorax. Small stable left pleural effusion. No overt pulmonary edema in the aerated left lung. IMPRESSION: 1. Endotracheal tube tip 1.9 cm above the carina. 2. Worsening essentially complete opacification of the right hemithorax, which could represent any combination of right pleural effusion, right lung atelectasis, pneumonia or mass. 3. Stable small left pleural effusion. Electronically Signed   By: Ilona Sorrel M.D.   On: 12/04/2017 08:41     STUDIES:  CTA Brain >>   SIGNIFICANT EVENTS: Intubated 12/03/2017  ASSESSMENT / PLAN:  PULMONARY A: Ventilatory dependent respiratory failure secondary to acute hypoxemic respiratory failure secondary to bilateral large pleural effusions secondary to acute on chronic right-sided heart failure and cirrhosis Acute pulm edema Right pleural effusion Hx os pulm sarcoid    P:   Patient started on Lasix IV twice daily yesterday continue with Lasix at this time.  Monitor urine output Patient has CT scan done today which showed large bilateral pleural effusions.  Ultrasound at bedside  also showed bilateral large pleural effusion.  Tried  to reach out to patient's daughter for consent and unable to get consent at this time.  We will try again.  Hopefully with Lasix patient will effusions might improve. - vent bundle - nebs prn SBT in a.m.  CARDIOVASCULAR A:  Acute diastolic CHF preserved EF Acute pulm edema paroxysmal afib off anticoagulation  Moderate pericardial effusion  P:  Echo reviewed which is similar to previous echo which shows large dilated RV and RA with significant tricuspid regurg and significantly elevated PA pressures Patient is currently on Levophed to maintain systolic blood pressure 315-176.  Slowly wean Levophed as tolerated. No anticoagulation at this time due to recent GI bleed No signs of [place] nod secondary to pericardial effusion.  RENAL A:   AKI secondary to cardiorenal Hyponatremia secondary to volume overload  P:   Continue with IV diuresis.  Monitor urine output. 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  Neurology following.  Repeat CT head shows 10 x 12 x 14 mm possible hemorrhage Antiplatelet therapy as per neurology. RASS goal: -1 -0   FAMILY    - Inter-disciplinary family meet or Palliative Care meeting due by: 12/09/2017   Carlyon Prows D.O [place] [facility] Pager: [phone]   12/04/2017, 7:33 PM

## 2017-12-04 NOTE — Progress Notes (Signed)
STROKE TEAM PROGRESS NOTE   SUBJECTIVE (INTERVAL HISTORY) Her RN is at the bedside.  Overall she feels her condition is rapidly improving. She is still intubated but awake alert, not on sedation (propofol line seems to have infiltration). Agitated and moving all extremities. Intermittently following commands. Had IR yesterday with TICI3 reperfusion. Not able to have MRI, but repeat CT concerning for small bleeding vs. Contrast staining. Will repeat CT at noon.    OBJECTIVE Temp:  [93.4 F (34.1 C)-100.1 F (37.8 C)] 100.1 F (37.8 C) (05/05 0400) Pulse Rate:  [79-108] 88 (05/05 0915) Cardiac Rhythm: Atrial paced (05/05 0800) Resp:  [8-27] 13 (05/05 0915) BP: (57-261)/(12-245) 109/55 (05/05 0915) SpO2:  [93 %-100 %] 100 % (05/05 0915) Arterial Line BP: (58-144)/(34-75) 109/57 (05/05 0915) FiO2 (%):  [40 %-100 %] 40 % (05/05 0846) Weight:  [178 lb 12.7 oz (81.1 kg)] 178 lb 12.7 oz (81.1 kg) (05/04 1657)  Recent Labs  Lab 12/03/17 0839 12/03/17 0916 12/03/17 1010  GLUCAP 149* 118* 113*   Recent Labs  Lab 12/03/17 0909 12/04/17 0417  NA 128* 133*  K 4.2 4.0  CL 94* 99*  CO2 21* 23  GLUCOSE 137* 177*  BUN 51* 39*  CREATININE 2.04* 1.98*  CALCIUM 8.4* 7.9*   Recent Labs  Lab 12/03/17 0909  AST 19  ALT 10*  ALKPHOS 48  BILITOT 1.0  PROT 5.8*  ALBUMIN 2.8*   Recent Labs  Lab 12/03/17 0909 12/04/17 0417  WBC 7.1 19.0*  NEUTROABS 4.5 15.9*  HGB 10.1* 9.1*  HCT 28.6* 28.1*  MCV 68.1* 69.7*  PLT 432* 408*   Recent Labs  Lab 12/03/17 0909  TROPONINI <0.03   No results for input(s): LABPROT, INR in the last 72 hours. Recent Labs    12/03/17 1726  COLORURINE STRAW*  LABSPEC 1.015  PHURINE 9.0*  GLUCOSEU NEGATIVE  HGBUR NEGATIVE  BILIRUBINUR NEGATIVE  KETONESUR NEGATIVE  PROTEINUR NEGATIVE  NITRITE NEGATIVE  LEUKOCYTESUR NEGATIVE       Component Value Date/Time   CHOL 70 12/04/2017 0417   TRIG 72 12/04/2017 0417   HDL 18 (L) 12/04/2017 0417    CHOLHDL 3.9 12/04/2017 0417   VLDL 14 12/04/2017 0417   LDLCALC 38 12/04/2017 0417   Lab Results  Component Value Date   HGBA1C 8.6 (H) 12/04/2017      Component Value Date/Time   LABOPIA NONE DETECTED 12/03/2017 1726   COCAINSCRNUR NONE DETECTED 12/03/2017 1726   LABBENZ POSITIVE (A) 12/03/2017 1726   AMPHETMU NONE DETECTED 12/03/2017 1726   THCU NONE DETECTED 12/03/2017 1726   LABBARB NONE DETECTED 12/03/2017 1726    Recent Labs  Lab 12/03/17 0909  ETH <10    I have personally reviewed the radiological images below and agree with the radiology interpretations.  Ct Angio Head W Or Wo Contrast  Result Date: 12/03/2017 CLINICAL DATA:  Code stroke.  Acute onset of right-sided weakness. EXAM: CT ANGIOGRAPHY HEAD AND NECK TECHNIQUE: Multidetector CT imaging of the head and neck was performed using the standard protocol during bolus administration of intravenous contrast. Multiplanar CT image reconstructions and MIPs were obtained to evaluate the vascular anatomy. Carotid stenosis measurements (when applicable) are obtained utilizing NASCET criteria, using the distal internal carotid diameter as the denominator. CONTRAST:  62mL ISOVUE-370 IOPAMIDOL (ISOVUE-370) INJECTION 76% COMPARISON:  CT head without contrast from the same day at 8:46 a.m. at [facility] FINDINGS: CT HEAD FINDINGS Brain: Repeat noncontrast CT of the head again  demonstrates remote infarcts of the left frontal operculum, left caudate head, and high right parietal lobe. Remote posterior left cerebellar infarcts are again seen. Anterior left insular hypoattenuation may be slightly worse than on the prior exam. The precentral gyrus is intact. Vascular: Atherosclerotic calcifications are again noted within the cavernous internal carotid arteries bilaterally. There is no hyperdense vessel. Skull: Calvarium is intact. No focal lytic or blastic lesions are present. Sinuses: Paranasal sinuses and mastoid air  cells are clear. Orbits: Remote blowout fracture is again noted in the right orbit. Right lens replacement is present. Globes and orbits are otherwise within normal limits. Review of the MIP images confirms the above findings CTA NECK FINDINGS Aortic arch: A 3 vessel arch configuration is present. Atherosclerotic calcifications are present at the origins of the great vessels without a significant stenosis. There is no aneurysm. Right carotid system: The right common carotid artery is within normal limits. Atherosclerotic changes are noted in the proximal right ICA. More distal right internal carotid artery is within normal limits. Left carotid system: The left common carotid artery is within normal limits. Atherosclerotic calcifications are present at the carotid bifurcation proximal left ICA without significant stenosis. There is mild tortuosity of the more distal left ICA without significant stenosis. Vertebral arteries: Atherosclerotic calcifications are present at the origin of the right vertebral artery without significant stenosis. The left vertebral artery is occluded at its origin. There is very faint reconstitution at the C2-3 level without intraluminal contrast at the foramen magnum. Skeleton: Rightward curvature is present in the lower cervical spine, compensating for leftward curvature in the upper thoracic spine. Other neck: Heterogeneous thyroid is present without a dominant lesion. Patient is intubated. No significant cervical adenopathy is present. Salivary glands are within normal limits. Upper chest: A large right pleural effusion is again seen. The moderate left-sided pleural effusion is present. There is partial collapse of the right upper lobe. Possible soft tissue fullness is present at the right hila. Recommend dedicated CT of the chest with contrast as tolerated. Review of the MIP images confirms the above findings CTA HEAD FINDINGS Anterior circulation: Atherosclerotic calcifications are  present within the cavernous internal carotid arteries bilaterally. There is no significant luminal stenosis relative to the more distal vessels through the ICA terminus. The M1 segments are intact bilaterally. No significant proximal occlusion is present on the right. There is moderate attenuation of superior and posterior M2 divisions. The left M1 segment is intact. Proximal superior left M2 and M3 segment is occluded or highly stenotic. The posterior left M2 division is not visualized. Posterior circulation: The right vertebral artery feeds the basilar. PICA origin is visualized. The basilar artery is small centrally terminating at the superior cerebellar arteries. Fetal type posterior cerebral arteries are present bilaterally with moderate distal segmental stenoses but no significant proximal stenosis or occlusion. Venous sinuses: Dural sinuses are patent. Straight sinus and deep cerebral veins are intact. Cortical veins are unremarkable. Anatomic variants: Fetal type posterior cerebral arteries bilaterally. Delayed phase: Postcontrast images are not performed in the acute setting. Review of the MIP images confirms the above findings IMPRESSION: 1. Proximal posterior division left M2 occlusion. 2. More distal anterior division left M2/M3 occlusion. 3. Extensive intracranial small vessel disease. 4. Occluded left vertebral artery with minimal reconstitution in the neck. 5. Fetal type posterior cerebral arteries bilaterally. 6. Atherosclerotic changes at the carotid bifurcations and cavernous internal carotid arteries bilaterally without other significant stenosis. 7. Aortic Atherosclerosis (ICD10-I70.0). Calcifications involve the great vessel origins without  significant stenoses. 8. Large bilateral pleural effusions, right greater than left. 9. Parenchymal volume loss in the right upper lobe likely related to the effusions. Central mass lesion not excluded. 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  Recommend dedicated CT the chest with contrast as the patient's condition allows. 10. These results were called by telephone at the time of interpretation on 12/03/2017 at 11:02 am to Dr. Roland Rack , who verbally acknowledged these results. Electronically Signed   By: San Morelle M.D.   On: 12/03/2017 11:05   Ct Head Wo Contrast  Result Date: 12/04/2017 CLINICAL DATA:  77 y/o F; stroke post catheter thrombectomy for follow-up. EXAM: CT HEAD WITHOUT CONTRAST TECHNIQUE: Contiguous axial images were obtained from the base of the skull through the vertex without intravenous contrast. COMPARISON:  11/04/2017 CT head, CTA head, angiogram head. FINDINGS: Brain: Amorphous density within the left anterior insula measuring 10 x 9 x 16 mm (AP x ML x CC series 6, image 17 and series 5, image 46). Hypoattenuation within the left lateral frontal lobe, frontal operculum, and insula. Small chronic infarcts in the posterior left cerebellum, left caudate head, right parietal lobe, and right frontal operculum. No hydrocephalus, extra-axial collection, or effacement of basilar cisterns. Vascular: Calcific atherosclerosis of carotid siphons. Skull: Normal. Negative for fracture or focal lesion. Sinuses/Orbits: Chronic right lamina papyracea fracture. Partial opacification of paranasal sinuses likely due to recent intubation. Other: None. IMPRESSION: 1. Amorphous density in left anterior insula measuring up to 16 mm, probably enhancing infarction, although hemorrhage is possible. Follow-up is recommended to ensure stability. 2. Additional area of hypoattenuation within left lateral frontal lobe, frontal operculum, insula compatible with evolving infarction. 3. Multiple additional stable chronic infarcts as above. These results were called by telephone at the time of interpretation on 12/04/2017 at 5:31 am to Dr. Rory Percy, who verbally acknowledged these results. Electronically Signed   By: Kristine Garbe M.D.    On: 12/04/2017 05:33   Ct Angio Neck W Or Wo Contrast  Result Date: 12/03/2017 CLINICAL DATA:  Code stroke.  Acute onset of right-sided weakness. EXAM: CT ANGIOGRAPHY HEAD AND NECK TECHNIQUE: Multidetector CT imaging of the head and neck was performed using the standard protocol during bolus administration of intravenous contrast. Multiplanar CT image reconstructions and MIPs were obtained to evaluate the vascular anatomy. Carotid stenosis measurements (when applicable) are obtained utilizing NASCET criteria, using the distal internal carotid diameter as the denominator. CONTRAST:  77mL ISOVUE-370 IOPAMIDOL (ISOVUE-370) INJECTION 76% COMPARISON:  CT head without contrast from the same day at 8:46 a.m. at [facility], [facility]. FINDINGS: CT HEAD FINDINGS Brain: Repeat noncontrast CT of the head again demonstrates remote infarcts of the left frontal operculum, left caudate head, and high right parietal lobe. Remote posterior left cerebellar infarcts are again seen. Anterior left insular hypoattenuation may be slightly worse than on the prior exam. The precentral gyrus is intact. Vascular: Atherosclerotic calcifications are again noted within the cavernous internal carotid arteries bilaterally. There is no hyperdense vessel. Skull: Calvarium is intact. No focal lytic or blastic lesions are present. Sinuses: Paranasal sinuses and mastoid air cells are clear. Orbits: Remote blowout fracture is again noted in the right orbit. Right lens replacement is present. Globes and orbits are otherwise within normal limits. Review of the MIP images confirms the above findings CTA NECK FINDINGS Aortic arch: A 3 vessel arch configuration is present. Atherosclerotic calcifications are present at the origins of the great vessels without a significant  stenosis. There is no aneurysm. Right carotid system: The right common carotid artery is within normal limits. Atherosclerotic changes are noted in the proximal right ICA.  More distal right internal carotid artery is within normal limits. Left carotid system: The left common carotid artery is within normal limits. Atherosclerotic calcifications are present at the carotid bifurcation proximal left ICA without significant stenosis. There is mild tortuosity of the more distal left ICA without significant stenosis. Vertebral arteries: Atherosclerotic calcifications are present at the origin of the right vertebral artery without significant stenosis. The left vertebral artery is occluded at its origin. There is very faint reconstitution at the C2-3 level without intraluminal contrast at the foramen magnum. Skeleton: Rightward curvature is present in the lower cervical spine, compensating for leftward curvature in the upper thoracic spine. Other neck: Heterogeneous thyroid is present without a dominant lesion. Patient is intubated. No significant cervical adenopathy is present. Salivary glands are within normal limits. Upper chest: A large right pleural effusion is again seen. The moderate left-sided pleural effusion is present. There is partial collapse of the right upper lobe. Possible soft tissue fullness is present at the right hila. Recommend dedicated CT of the chest with contrast as tolerated. Review of the MIP images confirms the above findings CTA HEAD FINDINGS Anterior circulation: Atherosclerotic calcifications are present within the cavernous internal carotid arteries bilaterally. There is no significant luminal stenosis relative to the more distal vessels through the ICA terminus. The M1 segments are intact bilaterally. No significant proximal occlusion is present on the right. There is moderate attenuation of superior and posterior M2 divisions. The left M1 segment is intact. Proximal superior left M2 and M3 segment is occluded or highly stenotic. The posterior left M2 division is not visualized. Posterior circulation: The right vertebral artery feeds the basilar. PICA origin  is visualized. The basilar artery is small centrally terminating at the superior cerebellar arteries. Fetal type posterior cerebral arteries are present bilaterally with moderate distal segmental stenoses but no significant proximal stenosis or occlusion. Venous sinuses: Dural sinuses are patent. Straight sinus and deep cerebral veins are intact. Cortical veins are unremarkable. Anatomic variants: Fetal type posterior cerebral arteries bilaterally. Delayed phase: Postcontrast images are not performed in the acute setting. Review of the MIP images confirms the above findings IMPRESSION: 1. Proximal posterior division left M2 occlusion. 2. More distal anterior division left M2/M3 occlusion. 3. Extensive intracranial small vessel disease. 4. Occluded left vertebral artery with minimal reconstitution in the neck. 5. Fetal type posterior cerebral arteries bilaterally. 6. Atherosclerotic changes at the carotid bifurcations and cavernous internal carotid arteries bilaterally without other significant stenosis. 7. Aortic Atherosclerosis (ICD10-I70.0). Calcifications involve the great vessel origins without significant stenoses. 8. Large bilateral pleural effusions, right greater than left. 9. Parenchymal volume loss in the right upper lobe likely related to the effusions. Central mass lesion not excluded. 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 Recommend dedicated CT the chest with contrast as the patient's condition allows. 10. These results were called by telephone at the time of interpretation on 12/03/2017 at 11:02 am to Dr. Roland Rack , who verbally acknowledged these results. Electronically Signed   By: San Morelle M.D.   On: 12/03/2017 11:05   Dg Chest Port 1 View  Result Date: 12/04/2017 CLINICAL DATA:  Respiratory failure EXAM: PORTABLE CHEST 1 VIEW COMPARISON:  Chest radiograph from one day prior. FINDINGS: Right rotated chest radiograph. Endotracheal tube tip is 1.9 cm above the  carina. Stable configuration of 2  lead left subclavian pacemaker. Stable cardiomediastinal silhouette with mild cardiomegaly. No pneumothorax. Worsening essentially complete opacification of the right hemithorax. Small stable left pleural effusion. No overt pulmonary edema in the aerated left lung. IMPRESSION: 1. Endotracheal tube tip 1.9 cm above the carina. 2. Worsening essentially complete opacification of the right hemithorax, which could represent any combination of right pleural effusion, right lung atelectasis, pneumonia or mass. 3. Stable small left pleural effusion. Electronically Signed   By: Ilona Sorrel M.D.   On: 12/04/2017 08:41   Dg Chest Portable 1 View  Result Date: 12/03/2017 CLINICAL DATA:  Respiratory failure and status post intubation. Status post left thoracentesis on 11/29/2017 and right thoracentesis on 11/28/2017. EXAM: PORTABLE CHEST 1 VIEW COMPARISON:  11/29/2017 and 11/25/2017 chest x-rays at [facility] FINDINGS: Stable cardiac enlargement and appearance of dual-chamber pacemaker. Endotracheal tube present with the tip approximately 2 cm above the carina. Since the prior chest x-ray, there is significant volume loss of the right lung with likely component recurrent pleural fluid as well as consolidation/atelectasis. The left lung shows no evidence of airspace disease, edema or pleural fluid. No pneumothorax identified. IMPRESSION: 1. Endotracheal tube tip approximately 2 cm above the carina. 2. Significant volume loss of right lung since prior chest x-ray with recurrent right pleural effusion as well as underlying consolidation/atelectasis of the right lung. Electronically Signed   By: Aletta Edouard M.D.   On: 12/03/2017 09:55   Ct Head Code Stroke Wo Contrast  Result Date: 12/03/2017 CLINICAL DATA:  Code stroke. Acute onset of unresponsiveness while in the emergency department. Patient is known nonverbal. Acute onset of right-sided weakness. EXAM: CT HEAD WITHOUT  CONTRAST TECHNIQUE: Contiguous axial images were obtained from the base of the skull through the vertex without intravenous contrast. COMPARISON:  None. FINDINGS: Brain: No acute infarct, hemorrhage, or mass lesion is present. 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 Hypodense infarct is present in the high right parietal lobe, likely also remote. A remote infarct of the left frontal operculum is present. Associated volume loss is noted. No acute left-sided infarct is present. No acute hemorrhage or mass lesion is present. Remote infarcts are present posteriorly within the left cerebellum. Brainstem is unremarkable. No significant extra-axial fluid collection is present. No significant extra-axial fluid collection is present. Vascular: Atherosclerotic calcifications are present within the internal carotid arteries bilaterally. There is no hyperdense vessel. Skull: Calvarium is intact. No focal lytic or blastic lesions are present. Sinuses/Orbits: The paranasal sinuses are clear. A remote right orbital blowout fracture is present. A right lens replacement is present. Globes and orbits are otherwise within normal limits. ASPECTS [facility] Stroke Program Early CT Score) - Ganglionic level infarction (caudate, lentiform nuclei, internal capsule, insula, M1-M3 cortex): 7/7 - Supraganglionic infarction (M4-M6 cortex): 3/3. Total score (0-10 with 10 being normal): 10/10. IMPRESSION: 1. Infarcts involving the high right parietal lobe and left frontal operculum appear remote. 2. Remote lacunar infarct of the left caudate head. 3. Remote infarcts of the posterior left cerebellum. 4. No acute infarct. 5. 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 6. ASPECTS is 10/10 These results were called by telephone at the time of interpretation on 12/03/2017 at 9:03 am to Dr. Blanchie Dessert , who verbally  acknowledged these results. Electronically Signed   By: San Morelle M.D.   On: 12/03/2017 09:08    TTE pending   PHYSICAL EXAM  Temp:  [93.4 F (34.1 C)-100.1 F (37.8 C)] 100.1 F (37.8 C) (05/05 0400) Pulse Rate:  [79-108] 88 (05/05 0915) Resp:  [8-27] 13 (05/05 0915) BP: (57-261)/(12-245) 109/55 (05/05 0915) SpO2:  [93 %-100 %] 100 % (05/05 0915) Arterial Line BP: (58-144)/(34-75) 109/57 (05/05 0915) FiO2 (%):  [40 %-100 %] 40 % (05/05 0846) Weight:  [178 lb 12.7 oz (81.1 kg)] 178 lb 12.7 oz (81.1 kg) (05/04 1657)  General - Well nourished, well developed, intubated and agitated  Ophthalmologic - fundi not visualized due to noncooperation.  Cardiovascular - Regular rate and rhythm, not in afib.  Neuro - awake, alert, intubated but agitated on vent, able to follow simple commands at hands and legs, but not consistent with central commands. PERRL, EOMI, no gaze deviation, not consistent on visual threat bilaterally. Facial symmetry and tongue position not able to test due to noncooperation. Moving all extremities equally and at least 4+/5. DTR 1+ and no babinski. Sensation, coordination not cooperative and gait not tested.    ASSESSMENT/PLAN Ana Gardner is a 77 y.o. 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 No tPA given due to recent GI bleeding.  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 Contrast  Repeat CT pending  2D Echo Pending  LDL 38  HgbA1c 8.6  SCDs for VTE prophylaxis  NPO for now   No antithrombotic prior to admission, now on No antithrombotic due to CT concerning bleeding vs. Contrast.   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  Intubated  CCM on board  AFib not on AC  Was on eliquis  Stopped in 10/2017 due to concerns of black stool   EGD showed severe portal hypertensive gastropathy  eliquis discontinued  Planned for more GI work up as outpt  Currently not on [facility] or antiplatelet due to anemia and CT concerning for ICH  Rate controlled   Right heart CHF  Severe dilated RA  TTE pending  CCM on board  Consider TEE if needed  Alcoholic liver cirrhosis  AST/ALT WNL  However, INR high in 10/2017  Check INR daily  EGD showed severe PHG   Recent GIB in 10/2017 due to severe PHG - GI following as outpt  No antithrombotics PTA  CKD  Cre 2.04->1.98  Gentle hydration  I/O monitoring  Anemia   Hb 10.1->9.1  Close monitoring  Likely due to recent GIB, IR procedure, and PHG  Diabetes  HgbA1c 8.6 goal < 7.0  Uncontrolled  CBG monitoring  SSI  DM education and close PCP follow up  Hypotension  BP at low side  On neo  and levophed  BP goal 110-140 post IR procedure  Other Stroke Risk Factors  Advanced age  ETOH use  Other Active Problems  Pacemaker present  Left breast cancer in situ on tamoxifen   Hospital day # 1  This patient is critically ill due to left MCA infarct, severe right-sided CHF s/p pacer, bilateral pleural effusion, atrial  fibrillation off Eliquis due to recent GI bleeding and portal hypertensive gastropathy, 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 This patient's care requires constant monitoring of vital signs, hemodynamics, respiratory and cardiac monitoring, review of multiple databases, neurological assessment, discussion with family, other specialists and medical decision making of high complexity. I spent 45 minutes of neurocritical care time in the care of this patient.  Rosalin Hawking, MD PhD Stroke Neurology 12/04/2017 10:17 AM    To contact Stroke Continuity provider, please refer to [email or web address] After hours, contact General Neurology

## 2017-12-04 NOTE — Progress Notes (Signed)
  Echocardiogram 2D Echocardiogram has been performed.  Darlina Sicilian M 12/04/2017, 9:29 AM

## 2017-12-04 NOTE — Progress Notes (Signed)
RT NOTE:  Pt transported to CT and back to 4N23 without event.  

## 2017-12-04 NOTE — Consult Note (Signed)
CTH findings called in by neurorads. Amorphous density suspicious for bleed v enhancing infarct in the left insula. Recommended serial CT follow up. Ordered CTH for noon. Stroke team will follow.  -- Amie [place], MD [facility] Pager: [phone] If 7pm to 7am, please call on call as listed on [facility].

## 2017-12-04 NOTE — Evaluation (Signed)
Physical Therapy Evaluation Patient Details Name: Ana Gardner MRN: [id number] DOB: 1940/09/28 Today's Date: 12/04/2017   History of Present Illness  77 y.o. 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 No tPA given due to recent GI bleeding.  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  Clinical Impression  Orders received for PT evaluation. Patient demonstrates deficits in functional mobility as indicated below. Will benefit from continued skilled PT to address deficits and maximize function. Will see as indicated and progress as tolerated.  Prior to admission, patient was extremely independent per daughter. At this time, patient is requiring physical assist for activity and mobility. Patient is consistent with ability to follow simple commands but appears impaired with further activity. At this time feel patient would benefit from comprehensive therapies. Recommend CIR consult.    Follow Up Recommendations CIR    Equipment Recommendations  (TBD)    Recommendations for Other Services Rehab consult     Precautions / Restrictions Precautions Precautions: Fall Precaution Comments: ET Tube, A line      Mobility  Bed Mobility Overal bed mobility: Needs Assistance Bed Mobility: Rolling;Sidelying to Sit;Sit to Supine Rolling: Min assist;+2 for physical assistance;+2 for safety/equipment Sidelying to sit: Mod assist;+2 for physical assistance;+2 for safety/equipment   Sit to supine: Mod assist;+2 for safety/equipment   General bed mobility comments: Moderate physical assist for mobility at bed level. Initiated LEs to EOB, patient then able to reach  with LUE to grab bed rail and pull to sidelying then elevated to upright. Patient was able to scoot to EOB with min guard for safety and +2 for line management. Patient assisted with LE elevation back to bed.  Transfers Overall transfer level: Needs assistance Equipment used: 1 person hand held assist Transfers: Sit to/from Stand Sit to Stand: Mod assist         General transfer comment: Mod assist for stability. (+2 to line management).   Ambulation/Gait             General Gait Details: unable to perform at this time (ET tube in place)  Stairs            Wheelchair Mobility    Modified Rankin (Stroke Patients Only) Modified Rankin (Stroke Patients Only) Pre-Morbid Rankin Score: No symptoms Modified Rankin: Severe disability     Balance Overall balance assessment: Needs assistance Sitting-balance support: Feet supported Sitting balance-Leahy Scale: Fair Sitting balance - Comments: able to sit EOB with min guard (assist for line management)   Standing balance support: During functional activity Standing balance-Leahy Scale: Poor Standing balance comment: min assist for stability in standing                             Pertinent Vitals/Pain Pain Assessment: Faces Faces Pain Scale: Hurts a little bit Pain Location: RUE Pain Descriptors / Indicators: Grimacing Pain Intervention(s): Monitored during session    Home Living Family/patient expects to be discharged to:: Private residence Living Arrangements: Alone Available Help at Discharge: Available PRN/intermittently Type of Home: Apartment Home Access: Level entry     Home Layout: One level Home Equipment: None      Prior Function Level of Independence: Independent  Hand Dominance        Extremity/Trunk Assessment   Upper Extremity Assessment Upper Extremity Assessment: (active motion noted in all extremities)    Lower Extremity Assessment Lower Extremity  Assessment: Difficult to assess due to impaired cognition(active motions all extremities, some difficulty for testing )       Communication   Communication: No difficulties  Cognition Arousal/Alertness: Awake/alert Behavior During Therapy: WFL for tasks assessed/performed Overall Cognitive Status: Difficult to assess Area of Impairment: Orientation;Attention;Memory;Following commands;Safety/judgement;Awareness;Problem solving                 Orientation Level: Person;Place(Nods head appropriately to location and name) Current Attention Level: Sustained   Following Commands: Follows one step commands consistently;Follows one step commands with increased time Safety/Judgement: Decreased awareness of safety;Decreased awareness of deficits   Problem Solving: Slow processing;Requires verbal cues;Requires tactile cues General Comments: One aroused, initiated movement to EOB, patient then able to appropriately reach for rail and pul herself to EOB and elevate to upright. Upon sitting EOB patient following commands consistently.      General Comments General comments (skin integrity, edema, etc.): hygiene and pericare perform    Exercises     Assessment/Plan    PT Assessment Patient needs continued PT services  PT Problem List Decreased strength;Decreased activity tolerance;Decreased balance;Decreased mobility;Decreased coordination;Decreased cognition;Decreased safety awareness       PT Treatment Interventions DME instruction;Gait training;Stair training;Functional mobility training;Therapeutic activities;Therapeutic exercise;Balance training;Neuromuscular re-education;Cognitive remediation;Patient/family education    PT Goals (Current goals can be found in the Care Plan section)  Acute Rehab PT Goals Patient Stated Goal: to be independent via daughter PT Goal Formulation: With family Time For Goal Achievement: 12/18/17 Potential to Achieve Goals: Good    Frequency Min  4X/week   Barriers to discharge        Co-evaluation               AM-PAC PT "6 Clicks" Daily Activity  Outcome Measure Difficulty turning over in bed (including adjusting bedclothes, sheets and blankets)?: Unable Difficulty moving from lying on back to sitting on the side of the bed? : Unable Difficulty sitting down on and standing up from a chair with arms (e.g., wheelchair, bedside commode, etc,.)?: Unable Help needed moving to and from a bed to chair (including a wheelchair)?: A Lot Help needed walking in hospital room?: A Lot Help needed climbing 3-5 steps with a railing? : Total 6 Click Score: 8    End of Session Equipment Utilized During Treatment: Oxygen Activity Tolerance: Patient tolerated treatment well Patient left: in bed;with call bell/phone within reach;with nursing/sitter in room Nurse Communication: Mobility status PT Visit Diagnosis: Other symptoms and signs involving the nervous system (R29.898)    Time: 6606-3016 PT Time Calculation (min) (ACUTE ONLY): 24 min   Charges:   PT Evaluation $PT Eval Moderate Complexity: 1 Mod     PT G Codes:        Alben Deeds, PT DPT  Board Certified Neurologic Specialist [place] 12/04/2017, 1:55 PM

## 2017-12-04 NOTE — Progress Notes (Signed)
Rehab Admissions Coordinator Note:  Patient was screened by Retta Diones for appropriateness for an Inpatient Acute Rehab Consult.  At this time, patient is intubated.  Once extubated, may benefit from acute inpatient rehab consult.  Call me for questions.  Retta Diones 12/04/2017, 7:44 PM  I can be reached at [phone].

## 2017-12-04 NOTE — Progress Notes (Signed)
SLP Cancellation Note  Patient Details Name: Ana Gardner MRN: [id number] DOB: 14-Feb-1941   Cancelled treatment:       Reason Eval/Treat Not Completed: Patient not medically ready  Gabriel Rainwater [place], [place] [phone]  [place] 12/04/2017, 8:06 AM

## 2017-12-04 NOTE — Progress Notes (Signed)
Patient has a pacemaker. RN informed MRI can not be done until we get the information and research the device. I will also inform ordering Doctor.

## 2017-12-05 ENCOUNTER — Encounter (HOSPITAL_COMMUNITY): Payer: Self-pay | Admitting: Interventional Radiology

## 2017-12-05 ENCOUNTER — Inpatient Hospital Stay (HOSPITAL_COMMUNITY): Payer: Medicare HMO

## 2017-12-05 DIAGNOSIS — I6389 Other cerebral infarction: Secondary | ICD-10-CM

## 2017-12-05 DIAGNOSIS — D689 Coagulation defect, unspecified: Secondary | ICD-10-CM

## 2017-12-05 DIAGNOSIS — I63412 Cerebral infarction due to embolism of left middle cerebral artery: Principal | ICD-10-CM

## 2017-12-05 DIAGNOSIS — K7031 Alcoholic cirrhosis of liver with ascites: Secondary | ICD-10-CM

## 2017-12-05 LAB — POCT I-STAT 7, (LYTES, BLD GAS, ICA,H+H)
Acid-Base Excess: 5 mmol/L — ABNORMAL HIGH (ref 0.0–2.0)
BICARBONATE: 28.2 mmol/L — AB (ref 20.0–28.0)
CALCIUM ION: 1.03 mmol/L — AB (ref 1.15–1.40)
HCT: 33 % — ABNORMAL LOW (ref 36.0–46.0)
Hemoglobin: 11.2 g/dL — ABNORMAL LOW (ref 12.0–15.0)
O2 Saturation: 100 %
PCO2 ART: 34.8 mmHg (ref 32.0–48.0)
POTASSIUM: 3.8 mmol/L (ref 3.5–5.1)
SODIUM: 132 mmol/L — AB (ref 135–145)
TCO2: 29 mmol/L (ref 22–32)
pH, Arterial: 7.516 — ABNORMAL HIGH (ref 7.350–7.450)
pO2, Arterial: 250 mmHg — ABNORMAL HIGH (ref 83.0–108.0)

## 2017-12-05 LAB — BRAIN NATRIURETIC PEPTIDE: B Natriuretic Peptide: 701 pg/mL — ABNORMAL HIGH (ref 0.0–100.0)

## 2017-12-05 LAB — BASIC METABOLIC PANEL
Anion gap: 9 (ref 5–15)
BUN: 26 mg/dL — ABNORMAL HIGH (ref 6–20)
CO2: 24 mmol/L (ref 22–32)
Calcium: 8.2 mg/dL — ABNORMAL LOW (ref 8.9–10.3)
Chloride: 101 mmol/L (ref 101–111)
Creatinine, Ser: 1.43 mg/dL — ABNORMAL HIGH (ref 0.44–1.00)
GFR calc Af Amer: 40 mL/min — ABNORMAL LOW (ref >60)
GFR calc non Af Amer: 35 mL/min — ABNORMAL LOW (ref >60)
Glucose, Bld: 163 mg/dL — ABNORMAL HIGH (ref 65–99)
Potassium: 3.6 mmol/L (ref 3.5–5.1)
Sodium: 134 mmol/L — ABNORMAL LOW (ref 135–145)

## 2017-12-05 LAB — MAGNESIUM
Magnesium: 1.8 mg/dL (ref 1.7–2.4)
Magnesium: 1.8 mg/dL (ref 1.7–2.4)

## 2017-12-05 LAB — PROTIME-INR
INR: 1.76
Prothrombin Time: 20.3 seconds — ABNORMAL HIGH (ref 11.4–15.2)

## 2017-12-05 LAB — TSH: TSH: 2.567 u[IU]/mL (ref 0.350–4.500)

## 2017-12-05 LAB — GLUCOSE, CAPILLARY: Glucose-Capillary: 120 mg/dL — ABNORMAL HIGH (ref 65–99)

## 2017-12-05 LAB — PROCALCITONIN: Procalcitonin: 0.5 ng/mL

## 2017-12-05 LAB — PHOSPHORUS: Phosphorus: 3.5 mg/dL (ref 2.5–4.6)

## 2017-12-05 MED ORDER — FUROSEMIDE 10 MG/ML IJ SOLN
40.0000 mg | Freq: Two times a day (BID) | INTRAMUSCULAR | Status: DC
  Administered 2017-12-05 – 2017-12-06 (×4): 40 mg via INTRAVENOUS
  Filled 2017-12-05 (×4): qty 4

## 2017-12-05 MED ORDER — VITAL AF 1.2 CAL PO LIQD
1000.0000 mL | ORAL | Status: DC
  Administered 2017-12-05: 1000 mL
  Filled 2017-12-05: qty 1000

## 2017-12-05 MED ORDER — FUROSEMIDE 10 MG/ML IJ SOLN: 60.0000 mg | Freq: Two times a day (BID) | INTRAMUSCULAR | Status: DC

## 2017-12-05 MED ORDER — SPIRONOLACTONE 100 MG PO TABS
100.0000 mg | ORAL_TABLET | Freq: Every day | ORAL | Status: DC
  Administered 2017-12-05: 100 mg
  Filled 2017-12-05 (×2): qty 1

## 2017-12-05 MED ORDER — OMEPRAZOLE 2 MG/ML ORAL SUSPENSION
40.0000 mg | Freq: Every day | ORAL | Status: DC
  Administered 2017-12-05: 40 mg
  Filled 2017-12-05 (×2): qty 20

## 2017-12-05 MED ORDER — PRO-STAT SUGAR FREE PO LIQD
30.0000 mL | Freq: Two times a day (BID) | ORAL | Status: DC
  Administered 2017-12-05: 30 mL
  Filled 2017-12-05: qty 30

## 2017-12-05 MED ORDER — OMEPRAZOLE 2 MG/ML ORAL SUSPENSION: 20.0000 mg | Freq: Every day | ORAL | Status: DC

## 2017-12-05 MED ORDER — POTASSIUM CHLORIDE 20 MEQ/15ML (10%) PO SOLN
40.0000 meq | Freq: Two times a day (BID) | ORAL | Status: DC
Start: 2017-12-05 — End: 2017-12-05

## 2017-12-05 MED ORDER — POTASSIUM CHLORIDE 20 MEQ/15ML (10%) PO SOLN
20.0000 meq | Freq: Two times a day (BID) | ORAL | Status: DC
  Administered 2017-12-05 (×2): 20 meq
  Filled 2017-12-05 (×2): qty 15

## 2017-12-05 NOTE — Progress Notes (Signed)
[facility] Progress Note Patient Name: Ana Gardner DOB: [birth year] MRN: [id number]   Date of Service  12/05/2017  HPI/Events of Note  Agitation - Request for bilateral soft wrist restraints.   eICU Interventions  Will order bilateral soft wrist restraints.      Intervention Category Minor Interventions: Agitation / anxiety - evaluation and management  Anja Neuzil [place] 12/05/2017, 12:38 AM

## 2017-12-05 NOTE — Progress Notes (Signed)
I had IV team to examine pt central line drsg. Central line was established yesterday and drsg had what appeared to be fresh blood. I was informed that when first applied drsgs are to be left alone for the first 24 hr and it would be more appropriate to change the drsg later to ensure bleeding has stopped.

## 2017-12-05 NOTE — Progress Notes (Addendum)
STROKE TEAM PROGRESS NOTE   SUBJECTIVE (INTERVAL HISTORY) Her RN is at the bedside.  Pt awake alert but still intubated due to respiratory issue. CT chest showed large right pleural effusion and moderate left pleural effusion. Repeat CT head yesterday showed likely small ICH at left insular cortex. Her INR 1.76 this am.    OBJECTIVE Temp:  [98.2 F (36.8 C)-99.4 F (37.4 C)] 98.5 F (36.9 C) (05/06 1119) Pulse Rate:  [80-104] 86 (05/06 1200) Cardiac Rhythm: Atrial paced (05/06 0800) Resp:  [13-22] 16 (05/06 1200) BP: (101-142)/(55-81) 120/58 (05/06 1119) SpO2:  [99 %-100 %] 100 % (05/06 1200) Arterial Line BP: (109-154)/(50-73) 124/59 (05/06 1200) FiO2 (%):  [40 %] 40 % (05/06 1119) Weight:  [194 lb 10.7 oz (88.3 kg)] 194 lb 10.7 oz (88.3 kg) (05/05 1742)  Recent Labs  Lab 12/03/17 0839 12/03/17 0916 12/03/17 1010 12/03/17 1327  GLUCAP 149* 118* 113* 120*   Recent Labs  Lab 12/03/17 0909 12/03/17 1139 12/04/17 0417 12/04/17 2046 12/05/17 0450  NA 128* 132* 133* 133* 134*  K 4.2 3.8 4.0 3.4* 3.6  CL 94*  --  99* 101 101  CO2 21*  --  23 24 24   GLUCOSE 137*  --  177* 174* 163*  BUN 51*  --  39* 29* 26*  CREATININE 2.04*  --  1.98* 1.55* 1.43*  CALCIUM 8.4*  --  7.9* 8.1* 8.2*  MG  --   --   --   --  1.8   Recent Labs  Lab 12/03/17 0909  AST 19  ALT 10*  ALKPHOS 48  BILITOT 1.0  PROT 5.8*  ALBUMIN 2.8*   Recent Labs  Lab 12/03/17 0909 12/03/17 1139 12/04/17 0417  WBC 7.1  --  19.0*  NEUTROABS 4.5  --  15.9*  HGB 10.1* 11.2* 9.1*  HCT 28.6* 33.0* 28.1*  MCV 68.1*  --  69.7*  PLT 432*  --  408*   Recent Labs  Lab 12/03/17 0909  TROPONINI <0.03   Recent Labs    12/04/17 2046 12/05/17 0450  LABPROT 21.2* 20.3*  INR 1.85 1.76   Recent Labs    12/03/17 1726  COLORURINE STRAW*  LABSPEC 1.015  PHURINE 9.0*  GLUCOSEU NEGATIVE  HGBUR NEGATIVE  BILIRUBINUR NEGATIVE  KETONESUR NEGATIVE  PROTEINUR NEGATIVE  NITRITE NEGATIVE  LEUKOCYTESUR  NEGATIVE       Component Value Date/Time   CHOL 70 12/04/2017 0417   TRIG 72 12/04/2017 0417   HDL 18 (L) 12/04/2017 0417   CHOLHDL 3.9 12/04/2017 0417   VLDL 14 12/04/2017 0417   LDLCALC 38 12/04/2017 0417   Lab Results  Component Value Date   HGBA1C 8.6 (H) 12/04/2017      Component Value Date/Time   LABOPIA NONE DETECTED 12/03/2017 1726   COCAINSCRNUR NONE DETECTED 12/03/2017 1726   LABBENZ POSITIVE (A) 12/03/2017 1726   AMPHETMU NONE DETECTED 12/03/2017 1726   THCU NONE DETECTED 12/03/2017 1726   LABBARB NONE DETECTED 12/03/2017 1726    Recent Labs  Lab 12/03/17 0909  ETH <10    I have personally reviewed the radiological images below and agree with the radiology interpretations.  Ct Angio Head W Or Wo Contrast  Result Date: 12/03/2017 CLINICAL DATA:  Code stroke.  Acute onset of right-sided weakness. EXAM: CT ANGIOGRAPHY HEAD AND NECK TECHNIQUE: Multidetector CT imaging of the head and neck was performed using the standard protocol during bolus administration of intravenous contrast. Multiplanar CT image reconstructions and MIPs were obtained  to evaluate the vascular anatomy. Carotid stenosis measurements (when applicable) are obtained utilizing NASCET criteria, using the distal internal carotid diameter as the denominator. CONTRAST:  47mL ISOVUE-370 IOPAMIDOL (ISOVUE-370) INJECTION 76% COMPARISON:  CT head without contrast from the same day at 8:46 a.m. at [facility], [facility]. FINDINGS: CT HEAD FINDINGS Brain: Repeat noncontrast CT of the head again demonstrates remote infarcts of the left frontal operculum, left caudate head, and high right parietal lobe. Remote posterior left cerebellar infarcts are again seen. Anterior left insular hypoattenuation may be slightly worse than on the prior exam. The precentral gyrus is intact. Vascular: Atherosclerotic calcifications are again noted within the cavernous internal carotid arteries bilaterally. There is no  hyperdense vessel. Skull: Calvarium is intact. No focal lytic or blastic lesions are present. Sinuses: Paranasal sinuses and mastoid air cells are clear. Orbits: Remote blowout fracture is again noted in the right orbit. Right lens replacement is present. Globes and orbits are otherwise within normal limits. Review of the MIP images confirms the above findings CTA NECK FINDINGS Aortic arch: A 3 vessel arch configuration is present. Atherosclerotic calcifications are present at the origins of the great vessels without a significant stenosis. There is no aneurysm. Right carotid system: The right common carotid artery is within normal limits. Atherosclerotic changes are noted in the proximal right ICA. More distal right internal carotid artery is within normal limits. Left carotid system: The left common carotid artery is within normal limits. Atherosclerotic calcifications are present at the carotid bifurcation proximal left ICA without significant stenosis. There is mild tortuosity of the more distal left ICA without significant stenosis. Vertebral arteries: Atherosclerotic calcifications are present at the origin of the right vertebral artery without significant stenosis. The left vertebral artery is occluded at its origin. There is very faint reconstitution at the C2-3 level without intraluminal contrast at the foramen magnum. Skeleton: Rightward curvature is present in the lower cervical spine, compensating for leftward curvature in the upper thoracic spine. Other neck: Heterogeneous thyroid is present without a dominant lesion. Patient is intubated. No significant cervical adenopathy is present. Salivary glands are within normal limits. Upper chest: A large right pleural effusion is again seen. The moderate left-sided pleural effusion is present. There is partial collapse of the right upper lobe. Possible soft tissue fullness is present at the right hila. Recommend dedicated CT of the chest with contrast as  tolerated. Review of the MIP images confirms the above findings CTA HEAD FINDINGS Anterior circulation: Atherosclerotic calcifications are present within the cavernous internal carotid arteries bilaterally. There is no significant luminal stenosis relative to the more distal vessels through the ICA terminus. The M1 segments are intact bilaterally. No significant proximal occlusion is present on the right. There is moderate attenuation of superior and posterior M2 divisions. The left M1 segment is intact. Proximal superior left M2 and M3 segment is occluded or highly stenotic. The posterior left M2 division is not visualized. Posterior circulation: The right vertebral artery feeds the basilar. PICA origin is visualized. The basilar artery is small centrally terminating at the superior cerebellar arteries. Fetal type posterior cerebral arteries are present bilaterally with moderate distal segmental stenoses but no significant proximal stenosis or occlusion. Venous sinuses: Dural sinuses are patent. Straight sinus and deep cerebral veins are intact. Cortical veins are unremarkable. Anatomic variants: Fetal type posterior cerebral arteries bilaterally. Delayed phase: Postcontrast images are not performed in the acute setting. Review of the MIP images confirms the above findings IMPRESSION: 1. Proximal posterior  division left M2 occlusion. 2. More distal anterior division left M2/M3 occlusion. 3. Extensive intracranial small vessel disease. 4. Occluded left vertebral artery with minimal reconstitution in the neck. 5. Fetal type posterior cerebral arteries bilaterally. 6. Atherosclerotic changes at the carotid bifurcations and cavernous internal carotid arteries bilaterally without other significant stenosis. 7. Aortic Atherosclerosis (ICD10-I70.0). Calcifications involve the great vessel origins without significant stenoses. 8. Large bilateral pleural effusions, right greater than left. 9. Parenchymal volume loss in the  right upper lobe likely related to the effusions. Central mass lesion not excluded. 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 Recommend dedicated CT the chest with contrast as the patient's condition allows. 10. These results were called by telephone at the time of interpretation on 12/03/2017 at 11:02 am to Dr. Roland Rack , who verbally acknowledged these results. Electronically Signed   By: San Morelle M.D.   On: 12/03/2017 11:05   Ct Head Wo Contrast  Result Date: 12/04/2017 CLINICAL DATA:  Follow-up exam for acute stroke, status post catheter directed revascularization. EXAM: CT HEAD WITHOUT CONTRAST TECHNIQUE: Contiguous axial images were obtained from the base of the skull through the vertex without intravenous contrast. COMPARISON:  Prior CT from earlier the same day. FINDINGS: Brain: Previously noted hyperdensity at the level of the left insula again seen, measuring 10 x 12 x 14 mm (transverse by AP by craniocaudad). Overall, this is similar in size with slightly decreased attenuation as compared to previous. Given persistence, hemorrhage is favored, although a degree of contrast staining may be contributory. Evolving acute left MCA territory infarcts within the adjacent all left frontal operculum and posterior frontal region again seen, slightly more apparent on current exam. No mass effect. No other acute intracranial hemorrhage. No other acute large vessel territory infarct. No mass lesion or midline shift. No hydrocephalus. No extra-axial fluid collection. Underlying atrophy with chronic small vessel ischemic changes again noted. Vascular: No new hyperdense vessel. Scattered vascular calcifications noted within the carotid siphons. Skull: No acute scalp soft tissue abnormality.  Calvarium intact. Sinuses/Orbits: Globes and orbital soft tissues within normal limits. Small air-fluid levels noted within the sphenoid sinuses. Scattered mucosal thickening throughout the  ethmoidal air cells and maxillary sinuses. Trace right mastoid effusion. Other: None. IMPRESSION: 1. Persistent hyperdensity at the left insula, similar in size measuring up to 14 mm with slightly decreased attenuation. Hemorrhage is favored. 2. Additional multifocal areas of evolving left MCA territory infarction, stable in distribution as compared to previous. 3. No other new acute intracranial abnormality. Electronically Signed   By: Jeannine Boga M.D.   On: 12/04/2017 14:29   Ct Head Wo Contrast  Result Date: 12/04/2017 CLINICAL DATA:  77 y/o F; stroke post catheter thrombectomy for follow-up. EXAM: CT HEAD WITHOUT CONTRAST TECHNIQUE: Contiguous axial images were obtained from the base of the skull through the vertex without intravenous contrast. COMPARISON:  11/04/2017 CT head, CTA head, angiogram head. FINDINGS: Brain: Amorphous density within the left anterior insula measuring 10 x 9 x 16 mm (AP x ML x CC series 6, image 17 and series 5, image 46). Hypoattenuation within the left lateral frontal lobe, frontal operculum, and insula. Small chronic infarcts in the posterior left cerebellum, left caudate head, right parietal lobe, and right frontal operculum. No hydrocephalus, extra-axial collection, or effacement of basilar cisterns. Vascular: Calcific atherosclerosis of carotid siphons. Skull: Normal. Negative for fracture or focal lesion. Sinuses/Orbits: Chronic right lamina papyracea fracture. Partial opacification of paranasal sinuses likely due to recent intubation. Other:  None. IMPRESSION: 1. Amorphous density in left anterior insula measuring up to 16 mm, probably enhancing infarction, although hemorrhage is possible. Follow-up is recommended to ensure stability. 2. Additional area of hypoattenuation within left lateral frontal lobe, frontal operculum, insula compatible with evolving infarction. 3. Multiple additional stable chronic infarcts as above. These results were called by telephone at  the time of interpretation on 12/04/2017 at 5:31 am to Dr. Rory Percy, who verbally acknowledged these results. Electronically Signed   By: Kristine Garbe M.D.   On: 12/04/2017 05:33   Ct Angio Neck W Or Wo Contrast  Result Date: 12/03/2017 CLINICAL DATA:  Code stroke.  Acute onset of right-sided weakness. EXAM: CT ANGIOGRAPHY HEAD AND NECK TECHNIQUE: Multidetector CT imaging of the head and neck was performed using the standard protocol during bolus administration of intravenous contrast. Multiplanar CT image reconstructions and MIPs were obtained to evaluate the vascular anatomy. Carotid stenosis measurements (when applicable) are obtained utilizing NASCET criteria, using the distal internal carotid diameter as the denominator. CONTRAST:  63mL ISOVUE-370 IOPAMIDOL (ISOVUE-370) INJECTION 76% COMPARISON:  CT head without contrast from the same day at 8:46 a.m. at [facility], [facility]. FINDINGS: CT HEAD FINDINGS Brain: Repeat noncontrast CT of the head again demonstrates remote infarcts of the left frontal operculum, left caudate head, and high right parietal lobe. Remote posterior left cerebellar infarcts are again seen. Anterior left insular hypoattenuation may be slightly worse than on the prior exam. The precentral gyrus is intact. Vascular: Atherosclerotic calcifications are again noted within the cavernous internal carotid arteries bilaterally. There is no hyperdense vessel. Skull: Calvarium is intact. No focal lytic or blastic lesions are present. Sinuses: Paranasal sinuses and mastoid air cells are clear. Orbits: Remote blowout fracture is again noted in the right orbit. Right lens replacement is present. Globes and orbits are otherwise within normal limits. Review of the MIP images confirms the above findings CTA NECK FINDINGS Aortic arch: A 3 vessel arch configuration is present. Atherosclerotic calcifications are present at the origins of the great vessels without a significant  stenosis. There is no aneurysm. Right carotid system: The right common carotid artery is within normal limits. Atherosclerotic changes are noted in the proximal right ICA. More distal right internal carotid artery is within normal limits. Left carotid system: The left common carotid artery is within normal limits. Atherosclerotic calcifications are present at the carotid bifurcation proximal left ICA without significant stenosis. There is mild tortuosity of the more distal left ICA without significant stenosis. Vertebral arteries: Atherosclerotic calcifications are present at the origin of the right vertebral artery without significant stenosis. The left vertebral artery is occluded at its origin. There is very faint reconstitution at the C2-3 level without intraluminal contrast at the foramen magnum. Skeleton: Rightward curvature is present in the lower cervical spine, compensating for leftward curvature in the upper thoracic spine. Other neck: Heterogeneous thyroid is present without a dominant lesion. Patient is intubated. No significant cervical adenopathy is present. Salivary glands are within normal limits. Upper chest: A large right pleural effusion is again seen. The moderate left-sided pleural effusion is present. There is partial collapse of the right upper lobe. Possible soft tissue fullness is present at the right hila. Recommend dedicated CT of the chest with contrast as tolerated. Review of the MIP images confirms the above findings CTA HEAD FINDINGS Anterior circulation: Atherosclerotic calcifications are present within the cavernous internal carotid arteries bilaterally. There is no significant luminal stenosis relative to the more distal vessels through  the ICA terminus. The M1 segments are intact bilaterally. No significant proximal occlusion is present on the right. There is moderate attenuation of superior and posterior M2 divisions. The left M1 segment is intact. Proximal superior left M2 and M3  segment is occluded or highly stenotic. The posterior left M2 division is not visualized. Posterior circulation: The right vertebral artery feeds the basilar. PICA origin is visualized. The basilar artery is small centrally terminating at the superior cerebellar arteries. Fetal type posterior cerebral arteries are present bilaterally with moderate distal segmental stenoses but no significant proximal stenosis or occlusion. Venous sinuses: Dural sinuses are patent. Straight sinus and deep cerebral veins are intact. Cortical veins are unremarkable. Anatomic variants: Fetal type posterior cerebral arteries bilaterally. Delayed phase: Postcontrast images are not performed in the acute setting. Review of the MIP images confirms the above findings IMPRESSION: 1. Proximal posterior division left M2 occlusion. 2. More distal anterior division left M2/M3 occlusion. 3. Extensive intracranial small vessel disease. 4. Occluded left vertebral artery with minimal reconstitution in the neck. 5. Fetal type posterior cerebral arteries bilaterally. 6. Atherosclerotic changes at the carotid bifurcations and cavernous internal carotid arteries bilaterally without other significant stenosis. 7. Aortic Atherosclerosis (ICD10-I70.0). Calcifications involve the great vessel origins without significant stenoses. 8. Large bilateral pleural effusions, right greater than left. 9. Parenchymal volume loss in the right upper lobe likely related to the effusions. Central mass lesion not excluded. 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 Recommend dedicated CT the chest with contrast as the patient's condition allows. 10. These results were called by telephone at the time of interpretation on 12/03/2017 at 11:02 am to Dr. Roland Rack , who verbally acknowledged these results. Electronically Signed   By: San Morelle M.D.   On: 12/03/2017 11:05   Ct Chest Wo Contrast  Addendum Date: 12/04/2017   ADDENDUM REPORT:  12/04/2017 17:04 ADDENDUM: The upper abdominal section of the original report was inadvertently not included and is dictated below. UPPER ABDOMEN: Liver margin appears irregular. Decreased attenuation in the dome is likely due to dilated hepatic veins. Stones are seen in the gallbladder. Probable adrenal thickening. Visualized portions of the kidneys, spleen, pancreas and stomach are grossly unremarkable. Upper abdominal lymph nodes do not appear enlarged by CT size criteria. Ascites. IMPRESSION: 8. Marginal irregularity of the liver is indicative of cirrhosis. 9.       Cholelithiasis. 10.     Ascites. Electronically Signed   By: Lorin Picket M.D.   On: 12/04/2017 17:04   Result Date: 12/04/2017 CLINICAL DATA:  Pleural effusions. EXAM: CT CHEST WITHOUT CONTRAST TECHNIQUE: Multidetector CT imaging of the chest was performed following the standard protocol without IV contrast. COMPARISON:  Chest radiograph 12/03/2017 and CT chest 01/01/2017. FINDINGS: Cardiovascular: Right IJ central line terminates in the SVC. Atherosclerotic calcification of the arterial vasculature, including coronary arteries. Pulmonary arteries are enlarged. Heart is moderately enlarged with a moderate pericardial effusion. Mediastinum/Nodes: Low internal jugular and mediastinal adenopathy measures up to 1.1 cm in the right paratracheal station. There are calcified mediastinal and bihilar lymph nodes. Distal periesophageal lymph node measures 11 mm, as before. No axillary adenopathy. Lungs/Pleura: Image quality is degraded by respiratory motion. Large right pleural effusion and adjacent collapse/consolidation obscure the majority of the right hemithorax. Nodular areas in the apex of the left upper lobe are grossly stable. Scattered areas of peribronchovascular ground-glass in the left upper lobe, possibly infectious or inflammatory in etiology. Moderate left pleural effusion with collapse/consolidation in the left  lower lobe. Endotracheal  tube terminates at the orifice of the left mainstem bronchus. Musculoskeletal: Degenerative changes in the spine. No worrisome lytic or sclerotic lesions. IMPRESSION: 1. Endotracheal tube is low lying, at the orifice of the left mainstem bronchus. Pulling back 3-4 cm would likely better position the tip above the carina. 2. Large right pleural effusion with collapse/consolidation in the adjacent right lung. 3. Moderate left pleural effusion with collapse/consolidation in the left lower lobe. 4. Scattered areas of nodularity and ground-glass in the left upper lobe, poorly evaluated due to respiratory motion. 5. Moderate pericardial effusion. 6. Aortic atherosclerosis (ICD10-170.0). Coronary artery calcification. 7. Enlarged pulmonary arteries, indicative of pulmonary arterial hypertension. Electronically Signed: By: Lorin Picket M.D. On: 12/04/2017 16:47   Dg Chest Port 1 View  Result Date: 12/04/2017 CLINICAL DATA:  Central line placement EXAM: PORTABLE CHEST 1 VIEW COMPARISON:  Chest radiograph from earlier today. 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 Endotracheal tube tip is 2.4 cm above the carina. Right internal jugular central venous catheter terminates in the middle third of the SVC. Stable cardiomediastinal silhouette with mild cardiomegaly. No pneumothorax. Moderate right pleural effusion, decreased. Stable small left pleural effusion. Improved aeration in the right lung with persistent right greater than left bibasilar lung opacities, stable on the left and decreased on the right. No pulmonary edema. IMPRESSION: 1. Right internal jugular central venous catheter terminates in the middle third of the SVC. Well-positioned endotracheal tube. 2. Moderate right pleural effusion, decreased. Improved aeration in the right lung. 3. Stable small left pleural effusion. 4. Right greater than left bibasilar lung opacities, decreased on the right and stable on the left. Electronically  Signed   By: Ilona Sorrel M.D.   On: 12/04/2017 11:04   Dg Chest Port 1 View  Result Date: 12/04/2017 CLINICAL DATA:  Respiratory failure EXAM: PORTABLE CHEST 1 VIEW COMPARISON:  Chest radiograph from one day prior. FINDINGS: Right rotated chest radiograph. Endotracheal tube tip is 1.9 cm above the carina. 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 Stable cardiomediastinal silhouette with mild cardiomegaly. No pneumothorax. Worsening essentially complete opacification of the right hemithorax. Small stable left pleural effusion. No overt pulmonary edema in the aerated left lung. IMPRESSION: 1. Endotracheal tube tip 1.9 cm above the carina. 2. Worsening essentially complete opacification of the right hemithorax, which could represent any combination of right pleural effusion, right lung atelectasis, pneumonia or mass. 3. Stable small left pleural effusion. Electronically Signed   By: Ilona Sorrel M.D.   On: 12/04/2017 08:41   Dg Chest Portable 1 View  Result Date: 12/03/2017 CLINICAL DATA:  Respiratory failure and status post intubation. Status post left thoracentesis on 11/29/2017 and right thoracentesis on 11/28/2017. EXAM: PORTABLE CHEST 1 VIEW COMPARISON:  11/29/2017 and 11/25/2017 chest x-rays at [facility] FINDINGS: Stable cardiac enlargement and appearance of dual-chamber pacemaker. Endotracheal tube present with the tip approximately 2 cm above the carina. Since the prior chest x-ray, there is significant volume loss of the right lung with likely component recurrent pleural fluid as well as consolidation/atelectasis. The left lung shows no evidence of airspace disease, edema or pleural fluid. No pneumothorax identified. IMPRESSION: 1. Endotracheal tube tip approximately 2 cm above the carina. 2. Significant volume loss of right lung since prior chest x-ray with recurrent right pleural effusion as well as underlying consolidation/atelectasis of the right lung. Electronically Signed    By: Aletta Edouard M.D.   On: 12/03/2017 09:55   Dg Abd Portable  1v  Result Date: 12/05/2017 CLINICAL DATA:  Status post nasogastric tube placement. The patient is intubated and has sustained a CVA. History of CHF. EXAM: PORTABLE ABDOMEN - 1 VIEW COMPARISON:  Chest x-ray of Dec 04, 2017 FINDINGS: There is volume loss on the right. There is increased density in the retrocardiac region on the left. There is dense calcification in the mitral valvular annulus. The gas pattern in the upper abdomen is unremarkable. The esophagogastric tube tip in proximal port project in the region of the gastric body. The endotracheal tube tip lies approximately 3 cm above the carina. The right internal jugular venous catheter tip projects over the midportion of the SVC. The ICD is in stable position. The heart is top-normal in size. The pulmonary vascularity is normal. There is calcification in the wall of the aortic arch. IMPRESSION: Improved aeration of the right lung. Persistent right pleural effusion and right basilar atelectasis or pneumonia. Persistent left lower lobe atelectasis or pneumonia. No pulmonary edema. The support tubes including the newly placed esophagogastric tube are in reasonable position. Thoracic aortic atherosclerosis. Electronically Signed   By: David  Martinique M.D.   On: 12/05/2017 11:21   Ct Head Code Stroke Wo Contrast  Result Date: 12/03/2017 CLINICAL DATA:  Code stroke. Acute onset of unresponsiveness while in the emergency department. Patient is known nonverbal. Acute onset of right-sided weakness. EXAM: CT HEAD WITHOUT CONTRAST TECHNIQUE: Contiguous axial images were obtained from the base of the skull through the vertex without intravenous contrast. COMPARISON:  None. FINDINGS: Brain: No acute infarct, hemorrhage, or mass lesion is present. 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 Hypodense infarct is present in the high  right parietal lobe, likely also remote. A remote infarct of the left frontal operculum is present. Associated volume loss is noted. No acute left-sided infarct is present. No acute hemorrhage or mass lesion is present. Remote infarcts are present posteriorly within the left cerebellum. Brainstem is unremarkable. No significant extra-axial fluid collection is present. No significant extra-axial fluid collection is present. Vascular: Atherosclerotic calcifications are present within the internal carotid arteries bilaterally. There is no hyperdense vessel. Skull: Calvarium is intact. No focal lytic or blastic lesions are present. Sinuses/Orbits: The paranasal sinuses are clear. A remote right orbital blowout fracture is present. A right lens replacement is present. Globes and orbits are otherwise within normal limits. ASPECTS [facility] Stroke Program Early CT Score) - Ganglionic level infarction (caudate, lentiform nuclei, internal capsule, insula, M1-M3 cortex): 7/7 - Supraganglionic infarction (M4-M6 cortex): 3/3. Total score (0-10 with 10 being normal): 10/10. IMPRESSION: 1. Infarcts involving the high right parietal lobe and left frontal operculum appear remote. 2. Remote lacunar infarct of the left caudate head. 3. Remote infarcts of the posterior left cerebellum. 4. No acute infarct. 5. 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 6. ASPECTS is 10/10 These results were called by telephone at the time of interpretation on 12/03/2017 at 9:03 am to Dr. Blanchie Dessert , who verbally acknowledged these results. Electronically Signed   By: San Morelle M.D.   On: 12/03/2017 09:08    TTE - Left ventricle: The cavity size was below normal. Wall thickness   was increased in a pattern of mild LVH. Systolic function was   vigorous. The estimated ejection fraction was in the range of 65%   to 70%. Wall motion was normal; there were no regional wall   motion  abnormalities. Doppler parameters are consistent with   abnormal left ventricular relaxation (grade 1 diastolic   dysfunction). Doppler parameters are consistent with high   ventricular filling pressure. - Ventricular septum: The contour showed diastolic flattening and   systolic flattening. These changes are consistent with RV volume   and pressure overload. - Aortic valve: Mildly calcified annulus. Trileaflet. - Mitral valve: Severely calcified annulus. Severely calcified   leaflets . The findings are consistent with mild stenosis. Mean   gradient (D): 4 mm Hg. Valve area by pressure half-time: 2.12   cm^2. Valve area by continuity equation (using LVOT flow): 0.81   cm^2. - Right ventricle: The cavity size was severely dilated. Pacer wire   or catheter noted in right ventricle. Systolic function was   reduced. - Right atrium: The atrium was severely dilated. Pacer wire or   catheter noted in right atrium. - Tricuspid valve: There was severe regurgitation. - Pulmonic valve: There was mild regurgitation. - Pulmonary arteries: Systolic pressure was severely increased. PA   peak pressure: 71 mm Hg (S). - Inferior vena cava: The CVP could not accurately be assessed as   the patient was ventilated (via IVC assessment). - Pericardium, extracardiac: A moderate to large size pericardial   effusion was noted. Features were not consistent with tamponade   physiology.   PHYSICAL EXAM  Temp:  [98.2 F (36.8 C)-99.4 F (37.4 C)] 98.5 F (36.9 C) (05/06 1119) Pulse Rate:  [80-104] 86 (05/06 1200) Resp:  [13-22] 16 (05/06 1200) BP: (101-142)/(55-81) 120/58 (05/06 1119) SpO2:  [99 %-100 %] 100 % (05/06 1200) Arterial Line BP: (109-154)/(50-73) 124/59 (05/06 1200) FiO2 (%):  [40 %] 40 % (05/06 1119) Weight:  [194 lb 10.7 oz (88.3 kg)] 194 lb 10.7 oz (88.3 kg) (05/05 1742)  General - Well nourished, well developed, intubated not on sedation  Ophthalmologic - fundi not visualized due to  noncooperation.  Cardiovascular - Regular rate and rhythm, not in afib.  Neuro - awake, alert, intubated not on sedation, able to follow some but not all simple commands. PERRL, EOMI, no gaze deviation, not consistent on visual threat bilaterally. Facial symmetry and tongue position not able to test due to ET tube. Moving all extremities equally and at least 4+/5. DTR 1+ and no babinski. Sensation, coordination not cooperative and gait not tested.   ASSESSMENT/PLAN Ms. Ana Gardner is a 77 y.o. 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 No tPA given due to recent GI bleeding.  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 Contrast  Repeat CT left inferior insular small ICH  2D Echo EF 65-70%, severely dilated RA, moderate to large pericardial effusion  LDL 38  HgbA1c 8.6  SCDs for VTE prophylaxis  NPO for now   No antithrombotic prior to admission, now on No antithrombotic due to  small ICH.   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 Would add ASA when appropriate  Right heart CHF  TTE EF 65-70%, severely dilated RA, moderate to large pericardial effusion  CCM on board  CT showed large pleural effusion  Intubated now  Consider cardiology consult if felt appropriate  Alcoholic liver cirrhosis  AST/ALT WNL  INR high in 10/2017  INR 1.85->1.76  EGD showed severe PHG   Recent GIB in 10/2017 due to severe PHG - GI following as outpt  No antithrombotics PTA  CKD  Cre 2.04->1.98->1.43  Gentle hydration  I/O monitoring  Anemia   Hb 10.1->9.1  Close monitoring  Likely due to recent GIB, IR procedure, and PHG  Diabetes  HgbA1c 8.6 goal < 7.0  Uncontrolled  CBG monitoring  SSI  DM education and close PCP follow up  Hypotension  BP stable  Still on levophed  BP goal 110-140   Other Stroke Risk Factors  Advanced age  ETOH use  Other Active Problems  Pacemaker present  Left breast cancer in situ on tamoxifen   Hospital day # 2  This patient is critically ill due to left MCA infarct, severe right-sided CHF s/p pacer, bilateral pleural effusion, atrial fibrillation off Eliquis due to recent GI bleeding and portal  hypertensive gastropathy, 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 This patient's care requires constant monitoring of vital signs, hemodynamics, respiratory and cardiac monitoring, review of multiple databases, neurological assessment, discussion with family, other specialists and medical decision making of high complexity. I spent 35 minutes of neurocritical care time in the care of this patient.  Rosalin Hawking, MD PhD Stroke Neurology 12/05/2017 12:40 PM    To contact Stroke Continuity provider, please refer to [email or web address] After hours, contact General Neurology

## 2017-12-05 NOTE — Progress Notes (Signed)
Physical Therapy Treatment Patient Details Name: Ana Gardner MRN: [id number] DOB: Jun 27, 1941 Today's Date: 12/05/2017    History of Present Illness 77 y.o. 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 No tPA given due to recent GI bleeding.  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    PT Comments    Patient seen in conjunction with OT for OOB activity progression.  Patient tolerated well with VSS throughout on ventilator support. Patient continues to show ability to perform some aspects of mobility with physical assist (currently +2 assist with line management). Patient was able to progress from chair position in bed to OOB to chair with VSS. She continues to show some difficulty with motor planning,  maintaining engagement and decreased awareness of self at times. Continue to feel patient would be ideal candidate from comprehensive therapies. CIR remains appropriate.  Follow Up Recommendations  CIR     Equipment Recommendations  (TBD)    Recommendations for Other Services Rehab consult     Precautions / Restrictions Precautions Precautions: Fall Precaution Comments: ET Tube, A line Restrictions Weight Bearing Restrictions: No    Mobility  Bed Mobility Overal bed mobility: Needs Assistance Bed Mobility: Sidelying to Sit   Sidelying to sit: Mod assist;+2 for physical assistance;+2 for safety/equipment       General bed mobility comments: Moderate physical assist for elevation to upright. Patient was able to scoot LEs to EOB and utilized therapists arm to pull to sit with increased time and effort to perform. Able to scoot to EOB  appropriately. +2 for line management and safety  Transfers Overall transfer level: Needs assistance Equipment used: 2 person hand held assist Transfers: Sit to/from [employer] Transfers Sit to Stand: Mod assist;+2 physical assistance Stand pivot transfers: Mod assist;+2 physical assistance       General transfer comment: Mod assist for stability. (+2 to line management). +2 moderate to maximial assist to pivot to chair with multi modal cues to facilitate weight shift and LE advancement throughout pivotal steps  Ambulation/Gait             General Gait Details: unable to perform at this time (ET tube in place)   Stairs             Wheelchair Mobility    Modified Rankin (Stroke Patients Only) Modified Rankin (Stroke Patients Only) Pre-Morbid Rankin Score: No symptoms Modified Rankin: Severe disability     Balance Overall balance assessment: Needs assistance Sitting-balance support: Feet supported Sitting balance-Leahy Scale: Fair Sitting balance - Comments: able to sit EOB with min guard (assist for line management)   Standing balance support: During functional activity Standing balance-Leahy Scale: Poor Standing balance comment: min assist for stability in standing due to extensive posterior lean                            Cognition Arousal/Alertness: Awake/alert Behavior During Therapy: WFL for tasks assessed/performed Overall Cognitive Status: Difficult to assess Area of Impairment: Orientation;Attention;Memory;Following commands;Safety/judgement;Awareness;Problem solving                 Orientation Level: Person;Place(Nods head appropriately to location and name) Current Attention Level: Focused;Sustained   Following Commands: Follows  one step commands consistently;Follows one step commands with increased time Safety/Judgement: Decreased awareness of safety;Decreased awareness of deficits   Problem Solving: Slow  processing;Requires verbal cues;Requires tactile cues General Comments: Continues to show improved ability to engage once upright. Patient with some consistency to follow basic one step commands 50% of time but shows some deficits in motor planning and recognition of body/self at times. Decreased attention noted. Max multi modal cues to engaged as needed throughout session. Patient able to carry out basic automatic tasks such as scooting and standing but shows increased time and difficulty carrying out non automatic tasks. Communication remains limited to head nods throughout session.      Exercises      General Comments        Pertinent Vitals/Pain Pain Assessment: Faces Faces Pain Scale: Hurts a little bit Pain Location: RUE Pain Descriptors / Indicators: Grimacing Pain Intervention(s): Monitored during session    Home Living                      Prior Function            PT Goals (current goals can now be found in the care plan section) Acute Rehab PT Goals Patient Stated Goal: to be independent via daughter PT Goal Formulation: With family Time For Goal Achievement: 12/18/17 Potential to Achieve Goals: Good Progress towards PT goals: Progressing toward goals    Frequency    Min 4X/week      PT Plan Current plan remains appropriate    Co-evaluation PT/OT/SLP Co-Evaluation/Treatment: Yes Reason for Co-Treatment: Complexity of the patient's impairments (multi-system involvement);Necessary to address cognition/behavior during functional activity;For patient/therapist safety;To address functional/ADL transfers PT goals addressed during session: Mobility/safety with mobility;Balance OT goals addressed during session: ADL's and self-care      AM-PAC PT "6 Clicks" Daily Activity  Outcome Measure  Difficulty turning over in bed (including adjusting bedclothes, sheets and blankets)?: Unable Difficulty moving from lying on back to sitting on the side of the  bed? : Unable Difficulty sitting down on and standing up from a chair with arms (e.g., wheelchair, bedside commode, etc,.)?: Unable Help needed moving to and from a bed to chair (including a wheelchair)?: A Lot Help needed walking in hospital room?: A Lot Help needed climbing 3-5 steps with a railing? : Total 6 Click Score: 8    End of Session Equipment Utilized During Treatment: Oxygen Activity Tolerance: Patient tolerated treatment well Patient left: in chair;with call bell/phone within reach;with chair alarm set;with nursing/sitter in room(A line recallebration, Suctioning) Nurse Communication: Mobility status PT Visit Diagnosis: Other symptoms and signs involving the nervous system (H41.937)     Time: 9024-0973 PT Time Calculation (min) (ACUTE ONLY): 25 min  Charges:  $Therapeutic Activity: 8-22 mins                    G Codes:       Alben Deeds, PT DPT  Board Certified Neurologic Specialist [phone]    Duncan Dull 12/05/2017, 10:20 AM

## 2017-12-05 NOTE — Progress Notes (Signed)
SLP Cancellation Note  Patient Details Name: Ana Gardner MRN: [id number] DOB: 10-17-1940   Cancelled treatment:       Reason Eval/Treat Not Completed: Patient not medically ready  Gabriel [place], [place] [phone]  [place] 12/05/2017, 8:23 AM

## 2017-12-05 NOTE — Progress Notes (Signed)
Occupational Therapy Evaluation completed.  Full note to follow.  Lucille Passy, OTR/L [phone]

## 2017-12-05 NOTE — Progress Notes (Addendum)
PULMONARY / CRITICAL CARE MEDICINE   Name: Ana Gardner MRN: [id number] DOB: January 31, 1941    ADMISSION DATE:  12/03/2017 CONSULTATION DATE:  12/03/2017  REFERRING MD:  Estanislado Pandy   CHIEF COMPLAINT:  Right sided weakness   HISTORY OF PRESENT ILLNESS:   77 y.o. 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  Patient walked into to the [facility] ED around 8am today with complaints of shortness of breath and edema in her bilateral lower extremities over the last month.    patient was discharged 11/29/17, with complaints of shortness of breath secondary to bilateral pleural effusion -right more than left, likely related to hypervolemic state from congestive heart failure and cirrhosis of liver. Ultrasound guided thoracentesis done 11/28/2017 with 1 L of pleural fluid drained right and another thoracentesis done on 11/29/2017 650 mL of fluid with drained from the left pleural space. She was started on torsemide and metolazone.  Patient presented to the ER, x-ray of the chest showed large recurrent right pleural effusion as well as underlying consolidation/atelectasis of the right lung. Today around 8:30 AM in the ED patient became acutely aphasic with dense right hemiplegia as well as forced left gaze deviation.  Emergent telemetry stroke was consulted at that time patient had NIHSS of 26.  Immediate CT Noncon was done that shows infarct involving the high right parietal lobe and  left frontal operculum.  Patient could not undergo CT angiogram at that time due to respiratory status.  , she was unable to follow commands or protect her airway and was intubated immediately. Patient was transferred immediately to [facility] for further stroke management. On arrival to [facility] patient immediately went to CT.  CT head/neck Angio  Showed proximal posterior division left M2 occlusion, More distal anterior division left M2/M3 occlusion as well aso occluded left vertebral artery with minimal reconstitution in the neck.  Neuro interventionalist was consulted immediately for endovascular revascularization Patient underwent and underwent revascularization of the left MCA.  Patient brought back to the ICU intubated.  SUBJECTIVE:  No acute events.  Had good UOP, -1.8L in past 24 hours.   VITAL SIGNS: BP 137/65   Pulse 89   Temp 99 F (37.2 C) (Axillary)   Resp 16   Ht '5\' 6"'$  (1.676 m)   Wt 88.3 kg (194 lb 10.7 oz)   SpO2 100%   BMI 31.42 kg/m    VENTILATOR SETTINGS: Vent Mode: PRVC FiO2 (%):  [40 %] 40 % Set Rate:  [16 bmp] 16 bmp Vt Set:  [460 mL] 460 mL PEEP:  [5 cmH20] 5 cmH20 Plateau Pressure:  [17 cmH20-20 cmH20] 19 cmH20  INTAKE / OUTPUT: I/O last 3 completed shifts: In: 5548.8 [I.V.:5498.8; IV Piggyback:50] Out: 9794 [IAXKP:5374]  PHYSICAL EXAMINATION: General: Adult female, resting in bed, in NAD Neuro: Patient is awake and alert and able to nod her head.  Extraocular muscles are intact. MAE's HEENT:  [place] / AT.  MMM.  ETT in place Cardiovascular:  RRR, 3/6 SEM Lungs:  Clear bilaterally though diminished in bases R > L  Abdomen:  Soft no tenderness  Musculoskeletal:  ++ edema lower limbs  Skin:  No rash  LABS:  BMET Recent Labs  Lab 12/04/17 0417 12/04/17 2046 12/05/17 0450  NA 133* 133* 134*  K 4.0 3.4* 3.6  CL 99* 101 101  CO2 '23 24 24  '$ BUN 39* 29* 26*  CREATININE 1.98* 1.55* 1.43*  GLUCOSE 177* 174* 163*    Electrolytes Recent Labs  Lab  12/04/17 0417 12/04/17 2046 12/05/17 0450  CALCIUM 7.9* 8.1* 8.2*  MG  --   --  1.8    CBC Recent Labs  Lab 12/03/17 0909 12/04/17 0417  WBC 7.1 19.0*  HGB 10.1* 9.1*  HCT 28.6* 28.1*  PLT 432* 408*    Coag's Recent Labs  Lab 12/04/17 2046 12/05/17 0450  INR 1.85 1.76    Sepsis Markers Recent Labs  Lab 12/05/17 0450  PROCALCITON 0.50    ABG Recent Labs  Lab 12/03/17 0942 12/03/17 2341  PHART 7.546* 7.431  PCO2ART 32.0 34.0  PO2ART 140.0* 88.2    Liver Enzymes Recent Labs  Lab 12/03/17 0909  AST 19  ALT 10*  ALKPHOS 48  BILITOT 1.0  ALBUMIN 2.8*    Cardiac Enzymes Recent Labs  Lab 12/03/17 0909  TROPONINI <0.03    Glucose Recent Labs  Lab 12/03/17 0839 12/03/17 0916 12/03/17 1010  GLUCAP 149* 118* 113*   Vent Mode: PRVC FiO2 (%):  [40 %] 40 % Set Rate:  [16 bmp] 16 bmp Vt Set:  [460 mL] 460 mL PEEP:  [5 cmH20] 5 cmH20 Plateau Pressure:  [17 cmH20-19 cmH20] 19 cmH20    Current Facility-Administered Medications:  .  0.9 %  sodium chloride infusion, 250 mL, Intravenous, PRN, Aljishi, Virgina Norfolk, MD .  Place/Maintain arterial line, , , Until Discontinued **AND** 0.9 %  sodium chloride infusion, , Intra-arterial, PRN, Deterding, Guadelupe Sabin, MD .  acetaminophen (TYLENOL) tablet 650 mg, 650 mg, Oral, Q4H PRN **OR** acetaminophen (TYLENOL) solution 650 mg, 650 mg, Per Tube, Q4H PRN **OR** acetaminophen (TYLENOL) suppository 650 mg, 650 mg, Rectal, Q4H PRN, Deveshwar, Sanjeev, MD .  chlorhexidine gluconate (MEDLINE KIT) (PERIDEX) 0.12 % solution 15 mL, 15 mL, Mouth Rinse, BID, Aljishi, Virgina Norfolk, MD, 15 mL at 12/05/17 0827 .  Chlorhexidine Gluconate Cloth 2 % PADS 6 each, 6 each, Topical, Daily, Ollis, Brandi L, NP, 6 each at 12/04/17 1745 .  docusate (COLACE) 50 MG/5ML liquid 100 mg, 100 mg, Per Tube, BID PRN, Aljishi, Virgina Norfolk, MD .  fentaNYL (SUBLIMAZE) injection 50 mcg, 50 mcg, Intravenous, Q15 min PRN, Aljishi, Wael Z, MD .  fentaNYL  (SUBLIMAZE) injection 50 mcg, 50 mcg, Intravenous, Q2H PRN, Aljishi, Wael Z, MD .  fentaNYL 2597mg in NS 2564m(104mml) infusion-PREMIX, 0-400 mcg/hr, Intravenous, Continuous, Hammonds, KatSharyn BlitzD, Stopped at 12/05/17 0935 .  furosemide (LASIX) injection 40 mg, 40 mg, Intravenous, Q12H, Desai, Rahul P, PA-C, 40 mg at 12/05/17 1213 .  MEDLINE mouth rinse, 15 mL, Mouth Rinse, 10 times per day, Aljishi, WaeVirgina NorfolkD, 15 mL at 12/05/17 1213 .  nicardipine (CARDENE) '20mg'$  in 0.86% saline 200m42m infusion (0.1 mg/ml), 0-15 mg/hr, Intravenous, Continuous, Deveshwar, Sanjeev, MD .  norepinephrine (LEVOPHED) 16 mg in dextrose 5 % 250 mL (0.064 mg/mL) infusion, 0-40 mcg/min, Intravenous, Titrated, EganSkeet SimmerH, Stopped at 12/05/17 1200 .  omeprazole (PRILOSEC) 2 mg/mL oral suspension SUSP 40 mg, 40 mg, Per Tube, Daily, Sivakumar, Siva P, MD .  potassium chloride 20 MEQ/15ML (10%) solution  20 mEq, 20 mEq, Per Tube, BID, Desai, Rahul P, PA-C, 20 mEq at 12/05/17 1213 .  sodium chloride flush (NS) 0.9 % injection 10-40 mL, 10-40 mL, Intracatheter, Q12H, Ollis, Brandi L, NP, 10 mL at 12/05/17 1214 .  sodium chloride flush (NS) 0.9 % injection 10-40 mL, 10-40 mL, Intracatheter, PRN, Ollis, Brandi L, NP .  spironolactone (ALDACTONE) tablet 100 mg, 100 mg, Per Tube, Daily, Desai, Rahul P, PA-C, 100 mg at 12/05/17 1213   Imaging Ct Head Wo Contrast  Result Date: 12/04/2017 CLINICAL DATA:  Follow-up exam for acute stroke, status post catheter directed revascularization. EXAM: CT HEAD WITHOUT CONTRAST TECHNIQUE: Contiguous axial images were obtained from the base of the skull through the vertex without intravenous contrast. COMPARISON:  Prior CT from earlier the same day. FINDINGS: Brain: Previously noted hyperdensity at the level of the left insula again seen, measuring 10 x 12 x 14 mm (transverse by AP by craniocaudad). Overall, this is similar in size with slightly decreased attenuation as compared to  previous. Given persistence, hemorrhage is favored, although a degree of contrast staining may be contributory. Evolving acute left MCA territory infarcts within the adjacent all left frontal operculum and posterior frontal region again seen, slightly more apparent on current exam. No mass effect. No other acute intracranial hemorrhage. No other acute large vessel territory infarct. No mass lesion or midline shift. No hydrocephalus. No extra-axial fluid collection. Underlying atrophy with chronic small vessel ischemic changes again noted. Vascular: No new hyperdense vessel. Scattered vascular calcifications noted within the carotid siphons. Skull: No acute scalp soft tissue abnormality.  Calvarium intact. Sinuses/Orbits: Globes and orbital soft tissues within normal limits. Small air-fluid levels noted within the sphenoid sinuses. Scattered mucosal thickening throughout the ethmoidal air cells and maxillary sinuses. Trace right mastoid effusion. Other: None. IMPRESSION: 1. Persistent hyperdensity at the left insula, similar in size measuring up to 14 mm with slightly decreased attenuation. Hemorrhage is favored. 2. Additional multifocal areas of evolving left MCA territory infarction, stable in distribution as compared to previous. 3. No other new acute intracranial abnormality. Electronically Signed   By: Jeannine Boga M.D.   On: 12/04/2017 14:29   Ct Chest Wo Contrast  Addendum Date: 12/04/2017   ADDENDUM REPORT: 12/04/2017 17:04 ADDENDUM: The upper abdominal section of the original report was inadvertently not included and is dictated below. UPPER ABDOMEN: Liver margin appears irregular. Decreased attenuation in the dome is likely due to dilated hepatic veins. Stones are seen in the gallbladder. Probable adrenal thickening. Visualized portions of the kidneys, spleen, pancreas and stomach are grossly unremarkable. Upper abdominal lymph nodes do not appear enlarged by CT size criteria. Ascites.  IMPRESSION: 8. Marginal irregularity of the liver is indicative of cirrhosis. 9.       Cholelithiasis. 10.     Ascites. Electronically Signed   By: Lorin Picket M.D.   On: 12/04/2017 17:04   Result Date: 12/04/2017 CLINICAL DATA:  Pleural effusions. EXAM: CT CHEST WITHOUT CONTRAST TECHNIQUE: Multidetector CT imaging of the chest was performed following the standard protocol without IV contrast. COMPARISON:  Chest radiograph 12/03/2017 and CT chest 01/01/2017. FINDINGS: Cardiovascular: Right IJ central line terminates in the SVC. Atherosclerotic calcification of the arterial vasculature, including coronary arteries. Pulmonary arteries are enlarged. Heart is moderately enlarged with a moderate pericardial effusion. Mediastinum/Nodes: Low internal jugular and mediastinal adenopathy measures up to 1.1 cm in the right paratracheal station. There are calcified mediastinal and bihilar lymph nodes. Distal periesophageal lymph node measures 11 mm,  as before. No axillary adenopathy. Lungs/Pleura: Image quality is degraded by respiratory motion. Large right pleural effusion and adjacent collapse/consolidation obscure the majority of the right hemithorax. Nodular areas in the apex of the left upper lobe are grossly stable. Scattered areas of peribronchovascular ground-glass in the left upper lobe, possibly infectious or inflammatory in etiology. Moderate left pleural effusion with collapse/consolidation in the left lower lobe. Endotracheal tube terminates at the orifice of the left mainstem bronchus. Musculoskeletal: Degenerative changes in the spine. No worrisome lytic or sclerotic lesions. IMPRESSION: 1. Endotracheal tube is low lying, at the orifice of the left mainstem bronchus. Pulling back 3-4 cm would likely better position the tip above the carina. 2. Large right pleural effusion with collapse/consolidation in the adjacent right lung. 3. Moderate left pleural effusion with collapse/consolidation in the left lower  lobe. 4. Scattered areas of nodularity and ground-glass in the left upper lobe, poorly evaluated due to respiratory motion. 5. Moderate pericardial effusion. 6. Aortic atherosclerosis (ICD10-170.0). Coronary artery calcification. 7. Enlarged pulmonary arteries, indicative of pulmonary arterial hypertension. Electronically Signed: By: Lorin Picket M.D. On: 12/04/2017 16:47   Dg Chest Port 1 View  Result Date: 12/04/2017 CLINICAL DATA:  Central line placement EXAM: PORTABLE CHEST 1 VIEW COMPARISON:  Chest radiograph from earlier today. 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 Endotracheal tube tip is 2.4 cm above the carina. Right internal jugular central venous catheter terminates in the middle third of the SVC. Stable cardiomediastinal silhouette with mild cardiomegaly. No pneumothorax. Moderate right pleural effusion, decreased. Stable small left pleural effusion. Improved aeration in the right lung with persistent right greater than left bibasilar lung opacities, stable on the left and decreased on the right. No pulmonary edema. IMPRESSION: 1. Right internal jugular central venous catheter terminates in the middle third of the SVC. Well-positioned endotracheal tube. 2. Moderate right pleural effusion, decreased. Improved aeration in the right lung. 3. Stable small left pleural effusion. 4. Right greater than left bibasilar lung opacities, decreased on the right and stable on the left. Electronically Signed   By: Ilona Sorrel M.D.   On: 12/04/2017 11:04     ekg- 12-03-17 paced rhythm   Echo  12-04-17 - Left ventricle: The cavity size was below normal. Wall thickness   was increased in a pattern of mild LVH. Systolic function was   vigorous. The estimated ejection fraction was in the range of 65%   to 70%. Wall motion was normal; there were no regional wall   motion abnormalities. Doppler parameters are consistent with   abnormal left ventricular relaxation (grade 1  diastolic   dysfunction). Doppler parameters are consistent with high   ventricular filling pressure. - Ventricular septum: The contour showed diastolic flattening and   systolic flattening. These changes are consistent with RV volume   and pressure overload. - Aortic valve: Mildly calcified annulus. Trileaflet. - Mitral valve: Severely calcified annulus. Severely calcified   leaflets . The findings are consistent with mild stenosis. Mean   gradient (D): 4 mm Hg. Valve area by pressure half-time: 2.12   cm^2. Valve area by continuity equation (using LVOT flow): 0.81   cm^2. - Right ventricle: The cavity size was severely dilated. Pacer wire   or catheter noted in right ventricle. Systolic function was   reduced. - Right atrium: The atrium was severely dilated. Pacer wire or   catheter noted in right atrium. - Tricuspid valve: There was severe regurgitation. - Pulmonic valve: There was mild regurgitation. - Pulmonary  arteries: Systolic pressure was severely increased. PA   peak pressure: 71 mm Hg (S). - Inferior vena cava: The CVP could not accurately be assessed as   the patient was ventilated (via IVC assessment). - Pericardium, extracardiac: A moderate to large size pericardial   effusion was noted. Features were not consistent with tamponade   physiology.   STUDIES:  CT Head 5/5 > persistent hyperdensity at left insula, hemorrhage is favored.  No new acute process. CT chest 5/5 > bilateral pleural effusions R > L, mod pericardial effusion, cirrhosis, ascites. Echo 5/5 > EF 65-70%, G1DD, RV overload, mod to large pericardial effusion without tamponade physiology  SIGNIFICANT EVENTS: Intubated 12/03/2017    ASSESSMENT / PLAN:  PULMONARY A: Ventilatory dependent respiratory failure secondary to acute hypoxemic respiratory failure secondary to bilateral large pleural effusions secondary to acute on chronic right-sided heart failure and cirrhosis Acute pulm edema - due to  above Hx of pulm sarcoid   P:   Continue full vent support Bronchial hygiene Continue with lasix BID and add spironolactone '100mg'$  daily today 5/6 Would continue with diuresis and avoid thora for now given almost certain recurrence (of note, just had R and L thora 4/29 and 4/30.  Unable to view labs from those procedures) SBT as able Follow CXR  CARDIOVASCULAR A:  Acute diastolic CHF preserved EF Acute pulm edema Paroxysmal afib off anticoagulation  Moderate pericardial effusion P:  Continue diuresis - spiro added today 5/6.  Assess response - might need doses increased No anticoagulation at this time due to recent GI bleed Continue levophed, wean to off as able  RENAL A:   AKI secondary to cardiorenal Hyponatremia secondary to volume overload P:   Continue with IV diuresis.  Monitor urine output. 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  Neurology following. Antiplatelet therapy as per neurology Sedation: Fentanyl gtt RASS goal: 0 to -1   FAMILY  - No family available 5/6 - Inter-disciplinary family meet or Palliative Care meeting due by: 12/09/2017    Montey Hora, [facility] Pager: [phone]  or [phone] 12/05/2017, 9:06 AM    ^^^^^^^^^^ P CCM Attending Note I have seen and examined the patient. Agree with APP's Assessment and plan. Please see my comments  as follows.  77 y/o female  with  Cardiomyopathy, PHT, A fib, PPM, h/o Sarcoidosis, Cor Pulmonale, recent GI bleed, off AC, recent M2M3 occlusion, cvca. 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 GI input reg cirrhosis Rx.  Monitor renal fxn. Cardiology input reg. Pericardial effusion. Supportive care. Prognosis guarded.  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  POCUS- pericardial effusion (poor views), bilateral R>L pleural effusions.   Spoke to daughter- Emmali Karow- updated current critical condition; she plans to visit on 5/8 Wed.  Thank you for letting me participate in the care of your patient. ^^^^^^^^^^ I  Have personally spent   55 Minutes  In the care of this Patient providing Critical care Services; Time includes review of chart, labs, imaging, coordinating care with other physicians and healthcare team members. Also includes time for frequent reevaluation and additional treatment implementation due to change in clinical condiiton of patient. Excludes time spent for Procedure and Teaching.  ^^^^^^^^^^ Note subject to typographical and grammatical errors;   Any formal questions or concerns about the content, text, or information contained within the body of this dictation should be directly addressed to the physician  for  clarification.  Evans Lance, MD Pulmonary and [facility] Pager: [phone]

## 2017-12-05 NOTE — Consult Note (Signed)
Referring Provider:  Dr. Shelda Jakes Primary Care Physician:  Karleen Hampshire., MD Primary Gastroenterologist:  Althia Forts followed by [facility] Reason for Consultation:  Recent history of GI bleed, history of cirrhosis  HPI: Ana Gardner is a 77 y.o. female with multiple medical problems including history of  CHFwith severe right-sided heart failure,, history of atrial fibrillation status post pacemaker placement, off anticoagulation due to recent GI bleed, history of cirrhosis, history of sarcoidosis presented to the hospital with right-sided weakness/acute CVA.underwent revascularization of the left MCA. Currently intubated for respiratory failure.GI is consulted for evaluation and further management .  Patient seen and examined at bedside. Patient remains intubated. Not able to pain history from the patient. Discussed with the nursing staff. No evidence of any active GI bleeding. Chart review from care everywhere done. GI workup as mentioned below.  Previous GI workup -------------------------- -EGD 4/18 -no active bleeding, severe portal hypertensive gastropathy - EGD/colonoscopy in 09/2016 for iron deficiency anemia, which revealed normal EGD, colon with internal hemorrhoids. SBCE revealed abnormality in proximal jejunum, so she underwent push enteroscopy in 10/2016 which revealed 2 benign jejunal polyps which were removed. CTE done in 08/2017 without any small intestinal abnormalities, but findings of cirrhosis       Past Medical History:  Diagnosis Date  . Cancer (HCC)    B/L  . CHF (congestive heart failure) ([place])   . Diabetes mellitus without complication ([place])   . Sarcoidosis     Past Surgical History:  Procedure Laterality Date  . MASTECTOMY Left 2004  . PACEMAKER IMPLANT      Prior to Admission medications   Medication Sig Start Date End Date Taking? Authorizing Provider  amiodarone (PACERONE) 200 MG tablet Take 200 mg by mouth daily.  11/09/17  Yes  [provider]  ELIQUIS 5 MG TABS tablet Take 5 mg by mouth 2 (two) times daily.  11/05/17  Yes [provider]  metolazone (ZAROXOLYN) 2.5 MG tablet Take 2.5 mg by mouth. Twice weekly 11/11/17  Yes [provider]  metoprolol succinate (TOPROL-XL) 25 MG 24 hr tablet Take 25 mg by mouth daily. 10/04/17  Yes [provider]  pantoprazole (PROTONIX) 40 MG tablet Take 40 mg by mouth daily.  11/21/17  Yes [provider]  torsemide (DEMADEX) 20 MG tablet Take 20 mg by mouth daily.  11/21/17  Yes [provider]    Scheduled Meds: . chlorhexidine gluconate (MEDLINE KIT)  15 mL Mouth Rinse BID  . Chlorhexidine Gluconate Cloth  6 each Topical Daily  . furosemide  40 mg Intravenous Q12H  . mouth rinse  15 mL Mouth Rinse 10 times per day  . potassium chloride  20 mEq Per Tube BID  . sodium chloride flush  10-40 mL Intracatheter Q12H  . spironolactone  100 mg Per Tube Daily   Continuous Infusions: . sodium chloride    . sodium chloride    . fentaNYL infusion INTRAVENOUS 50 mcg/hr (12/05/17 0900)  . niCARDipine    . norepinephrine (LEVOPHED) Adult infusion 10 mcg/min (12/05/17 0900)   PRN Meds:.sodium chloride, Place/Maintain arterial line **AND** sodium chloride, acetaminophen **OR** acetaminophen (TYLENOL) oral liquid 160 mg/5 mL **OR** acetaminophen, docusate, fentaNYL (SUBLIMAZE) injection, fentaNYL (SUBLIMAZE) injection, sodium chloride flush  Allergies as of 12/03/2017  . (Not on File)    History reviewed. No pertinent family history.  Social History   Socioeconomic History  . Marital status: Divorced    Spouse name: Not on file  . Number  of children: Not on file  . Years of education: Not on file  . Highest education level: Not on file  Occupational History  . Not on file  Social Needs  . Financial resource strain: Not on file  . Food insecurity:    Worry: Not on file    Inability: Not on file  . Transportation needs:     Medical: Not on file    Non-medical: Not on file  Tobacco Use  . Smoking status: Never Smoker  . Smokeless tobacco: Never Used  Substance and Sexual Activity  . Alcohol use: Never    Frequency: Never  . Drug use: Never  . Sexual activity: Not on file  Lifestyle  . Physical activity:    Days per week: Not on file    Minutes per session: Not on file  . Stress: Not on file  Relationships  . Social connections:    Talks on phone: Not on file    Gets together: Not on file    Attends religious service: Not on file    Active member of club or organization: Not on file    Attends meetings of clubs or organizations: Not on file    Relationship status: Not on file  . Intimate partner violence:    Fear of current or ex partner: Not on file    Emotionally abused: Not on file    Physically abused: Not on file    Forced sexual activity: Not on file  Other Topics Concern  . Not on file  Social History Narrative  . Not on file    Review of Systems: not able to obtain  Physical Exam: Vital signs: Vitals:   12/05/17 0800 12/05/17 0900  BP:    Pulse: 89 88  Resp: 16 16  Temp:    SpO2: 100% 100%     Gen. Intubated. Opens eyes with tactile stimuli. Heart. RRR, Murmur present  Chest : Coarse breath sounds bilaterally. Abdomen. Mild distended, nontender, bowel sounds present. No peritoneal signs.  GI:  Lab Results: Recent Labs    12/03/17 0909 12/03/17 1139 12/04/17 0417  WBC 7.1  --  19.0*  HGB 10.1* 11.2* 9.1*  HCT 28.6* 33.0* 28.1*  PLT 432*  --  408*   BMET Recent Labs    12/04/17 0417 12/04/17 2046 12/05/17 0450  NA 133* 133* 134*  K 4.0 3.4* 3.6  CL 99* 101 101  CO2 '23 24 24  '$ GLUCOSE 177* 174* 163*  BUN 39* 29* 26*  CREATININE 1.98* 1.55* 1.43*  CALCIUM 7.9* 8.1* 8.2*   LFT Recent Labs    12/03/17 0909  PROT 5.8*  ALBUMIN 2.8*  AST 19  ALT 10*  ALKPHOS 48  BILITOT 1.0   PT/INR Recent Labs    12/04/17 2046 12/05/17 0450  LABPROT 21.2* 20.3*   INR 1.85 1.76     Studies/Results: Ct Angio Head W Or Wo Contrast  Result Date: 12/03/2017 CLINICAL DATA:  Code stroke.  Acute onset of right-sided weakness. EXAM: CT ANGIOGRAPHY HEAD AND NECK TECHNIQUE: Multidetector CT imaging of the head and neck was performed using the standard protocol during bolus administration of intravenous contrast. Multiplanar CT image reconstructions and MIPs were obtained to evaluate the vascular anatomy. Carotid stenosis measurements (when applicable) are obtained utilizing NASCET criteria, using the distal internal carotid diameter as the denominator. CONTRAST:  56m ISOVUE-370 IOPAMIDOL (ISOVUE-370) INJECTION 76% COMPARISON:  CT head without contrast [facility] FINDINGS: CT HEAD FINDINGS Brain: Repeat noncontrast CT of the head again demonstrates remote infarcts of the left frontal operculum, left caudate head, and high right parietal lobe. Remote posterior left cerebellar infarcts are again seen. Anterior left insular hypoattenuation may be slightly worse than on the prior exam. The precentral gyrus is intact. Vascular: Atherosclerotic calcifications are again noted within the cavernous internal carotid arteries bilaterally. There is no hyperdense vessel. Skull: Calvarium is intact. No focal lytic or blastic lesions are present. Sinuses: Paranasal sinuses and mastoid air cells are clear. Orbits: Remote blowout fracture is again noted in the right orbit. Right lens replacement is present. Globes and orbits are otherwise within normal limits. Review of the MIP images confirms the above findings CTA NECK FINDINGS Aortic arch: A 3 vessel arch configuration is present. Atherosclerotic calcifications are present at the origins of the great vessels without a significant stenosis. There is no aneurysm. Right carotid system: The right common carotid artery is within normal limits. Atherosclerotic changes are noted in the  proximal right ICA. More distal right internal carotid artery is within normal limits. Left carotid system: The left common carotid artery is within normal limits. Atherosclerotic calcifications are present at the carotid bifurcation proximal left ICA without significant stenosis. There is mild tortuosity of the more distal left ICA without significant stenosis. Vertebral arteries: Atherosclerotic calcifications are present at the origin of the right vertebral artery without significant stenosis. The left vertebral artery is occluded at its origin. There is very faint reconstitution at the C2-3 level without intraluminal contrast at the foramen magnum. Skeleton: Rightward curvature is present in the lower cervical spine, compensating for leftward curvature in the upper thoracic spine. Other neck: Heterogeneous thyroid is present without a dominant lesion. Patient is intubated. No significant cervical adenopathy is present. Salivary glands are within normal limits. Upper chest: A large right pleural effusion is again seen. The moderate left-sided pleural effusion is present. There is partial collapse of the right upper lobe. Possible soft tissue fullness is present at the right hila. Recommend dedicated CT of the chest with contrast as tolerated. Review of the MIP images confirms the above findings CTA HEAD FINDINGS Anterior circulation: Atherosclerotic calcifications are present within the cavernous internal carotid arteries bilaterally. There is no significant luminal stenosis relative to the more distal vessels through the ICA terminus. The M1 segments are intact bilaterally. No significant proximal occlusion is present on the right. There is moderate attenuation of superior and posterior M2 divisions. The left M1 segment is intact. Proximal superior left M2 and M3 segment is occluded or highly stenotic. The posterior left M2 division is not visualized. Posterior circulation: The right vertebral artery feeds the  basilar. PICA origin is visualized. The basilar artery is small centrally terminating at the superior cerebellar arteries. Fetal type posterior cerebral arteries are present bilaterally with moderate distal segmental stenoses but no significant proximal stenosis or occlusion. Venous sinuses: Dural sinuses are patent. Straight sinus and deep cerebral veins are intact. Cortical veins are unremarkable. Anatomic variants: Fetal type posterior cerebral arteries bilaterally. Delayed phase: Postcontrast images are not performed in the acute setting. Review of the MIP images confirms the above findings IMPRESSION: 1. Proximal posterior division left M2 occlusion. 2. More distal anterior division left M2/M3 occlusion. 3. Extensive intracranial small vessel disease. 4. Occluded left vertebral artery with minimal reconstitution in the neck. 5. Fetal type posterior cerebral arteries bilaterally. 6. Atherosclerotic changes at the carotid bifurcations and cavernous internal carotid arteries  bilaterally without other significant stenosis. 7. Aortic Atherosclerosis (ICD10-I70.0). Calcifications involve the great vessel origins without significant stenoses. 8. Large bilateral pleural effusions, right greater than left. 9. Parenchymal volume loss in the right upper lobe likely related to the effusions. Central mass lesion not excluded. 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 Recommend dedicated CT the chest with contrast as the patient's condition allows. 10. These results were called by telephone at the time of interpretation on 12/03/2017 at 11:02 am to Dr. Roland Rack , who verbally acknowledged these results. Electronically Signed   By: San Morelle M.D.   On: 12/03/2017 11:05   Ct Head Wo Contrast  Result Date: 12/04/2017 CLINICAL DATA:  Follow-up exam for acute stroke, status post catheter directed revascularization. EXAM: CT HEAD WITHOUT CONTRAST TECHNIQUE: Contiguous axial images were obtained  from the base of the skull through the vertex without intravenous contrast. COMPARISON:  Prior CT from earlier the same day. FINDINGS: Brain: Previously noted hyperdensity at the level of the left insula again seen, measuring 10 x 12 x 14 mm (transverse by AP by craniocaudad). Overall, this is similar in size with slightly decreased attenuation as compared to previous. Given persistence, hemorrhage is favored, although a degree of contrast staining may be contributory. Evolving acute left MCA territory infarcts within the adjacent all left frontal operculum and posterior frontal region again seen, slightly more apparent on current exam. No mass effect. No other acute intracranial hemorrhage. No other acute large vessel territory infarct. No mass lesion or midline shift. No hydrocephalus. No extra-axial fluid collection. Underlying atrophy with chronic small vessel ischemic changes again noted. Vascular: No new hyperdense vessel. Scattered vascular calcifications noted within the carotid siphons. Skull: No acute scalp soft tissue abnormality.  Calvarium intact. Sinuses/Orbits: Globes and orbital soft tissues within normal limits. Small air-fluid levels noted within the sphenoid sinuses. Scattered mucosal thickening throughout the ethmoidal air cells and maxillary sinuses. Trace right mastoid effusion. Other: None. IMPRESSION: 1. Persistent hyperdensity at the left insula, similar in size measuring up to 14 mm with slightly decreased attenuation. Hemorrhage is favored. 2. Additional multifocal areas of evolving left MCA territory infarction, stable in distribution as compared to previous. 3. No other new acute intracranial abnormality. Electronically Signed   By: Jeannine Boga M.D.   On: 12/04/2017 14:29   Ct Head Wo Contrast  Result Date: 12/04/2017 CLINICAL DATA:  77 y/o F; stroke post catheter thrombectomy for follow-up. EXAM: CT HEAD WITHOUT CONTRAST TECHNIQUE: Contiguous axial images were obtained from  the base of the skull through the vertex without intravenous contrast. COMPARISON:  11/04/2017 CT head, CTA head, angiogram head. FINDINGS: Brain: Amorphous density within the left anterior insula measuring 10 x 9 x 16 mm (AP x ML x CC series 6, image 17 and series 5, image 46). Hypoattenuation within the left lateral frontal lobe, frontal operculum, and insula. Small chronic infarcts in the posterior left cerebellum, left caudate head, right parietal lobe, and right frontal operculum. No hydrocephalus, extra-axial collection, or effacement of basilar cisterns. Vascular: Calcific atherosclerosis of carotid siphons. Skull: Normal. Negative for fracture or focal lesion. Sinuses/Orbits: Chronic right lamina papyracea fracture. Partial opacification of paranasal sinuses likely due to recent intubation. Other: None. IMPRESSION: 1. Amorphous density in left anterior insula measuring up to 16 mm, probably enhancing infarction, although hemorrhage is possible. Follow-up is recommended to ensure stability. 2. Additional area of hypoattenuation within left lateral frontal lobe, frontal operculum, insula compatible with evolving infarction. 3. Multiple additional stable  chronic infarcts as above. These results were called by telephone at the time of interpretation on 12/04/2017 at 5:31 am to Dr. Rory Percy, who verbally acknowledged these results. Electronically Signed   By: Kristine Garbe M.D.   On: 12/04/2017 05:33   Ct Angio Neck W Or Wo Contrast  Result Date: 12/03/2017 CLINICAL DATA:  Code stroke.  Acute onset of right-sided weakness. EXAM: CT ANGIOGRAPHY HEAD AND NECK TECHNIQUE: Multidetector CT imaging of the head and neck was performed using the standard protocol during bolus administration of intravenous contrast. Multiplanar CT image reconstructions and MIPs were obtained to evaluate the vascular anatomy. Carotid stenosis measurements (when applicable) are obtained utilizing NASCET criteria, using the distal  internal carotid diameter as the denominator. CONTRAST:  65m ISOVUE-370 IOPAMIDOL (ISOVUE-370) INJECTION 76% COMPARISON:  [facility] head [facility] contrast [facility] FINDINGS: CT HEAD FINDINGS Brain: Repeat noncontrast CT of the head again demonstrates remote infarcts of the left frontal operculum, left caudate head, and high right parietal lobe. Remote posterior left cerebellar infarcts are again seen. Anterior left insular hypoattenuation may be slightly worse than on the prior exam. The precentral gyrus is intact. Vascular: Atherosclerotic calcifications are again noted within the cavernous internal carotid arteries bilaterally. There is no hyperdense vessel. Skull: Calvarium is intact. No focal lytic or blastic lesions are present. Sinuses: Paranasal sinuses and mastoid air cells are clear. Orbits: Remote blowout fracture is again noted in the right orbit. Right lens replacement is present. Globes and orbits are otherwise within normal limits. Review of the MIP images confirms the above findings CTA NECK FINDINGS Aortic arch: A 3 vessel arch configuration is present. Atherosclerotic calcifications are present at the origins of the great vessels without a significant stenosis. There is no aneurysm. Right carotid system: The right common carotid artery is within normal limits. Atherosclerotic changes are noted in the proximal right ICA. More distal right internal carotid artery is within normal limits. Left carotid system: The left common carotid artery is within normal limits. Atherosclerotic calcifications are present at the carotid bifurcation proximal left ICA without significant stenosis. There is mild tortuosity of the more distal left ICA without significant stenosis. Vertebral arteries: Atherosclerotic calcifications are present at the origin of the right vertebral artery without significant stenosis. The left vertebral artery is occluded at its origin.  There is very faint reconstitution at the C2-3 level without intraluminal contrast at the foramen magnum. Skeleton: Rightward curvature is present in the lower cervical spine, compensating for leftward curvature in the upper thoracic spine. Other neck: Heterogeneous thyroid is present without a dominant lesion. Patient is intubated. No significant cervical adenopathy is present. Salivary glands are within normal limits. Upper chest: A large right pleural effusion is again seen. The moderate left-sided pleural effusion is present. There is partial collapse of the right upper lobe. Possible soft tissue fullness is present at the right hila. Recommend dedicated CT of the chest with contrast as tolerated. Review of the MIP images confirms the above findings CTA HEAD FINDINGS Anterior circulation: Atherosclerotic calcifications are present within the cavernous internal carotid arteries bilaterally. There is no significant luminal stenosis relative to the more distal vessels through the ICA terminus. The M1 segments are intact bilaterally. No significant proximal occlusion is present on the right. There is moderate attenuation of superior and posterior M2 divisions. The left M1 segment is intact. Proximal superior left M2 and M3 segment is occluded or highly stenotic. The posterior  left M2 division is not visualized. Posterior circulation: The right vertebral artery feeds the basilar. PICA origin is visualized. The basilar artery is small centrally terminating at the superior cerebellar arteries. Fetal type posterior cerebral arteries are present bilaterally with moderate distal segmental stenoses but no significant proximal stenosis or occlusion. Venous sinuses: Dural sinuses are patent. Straight sinus and deep cerebral veins are intact. Cortical veins are unremarkable. Anatomic variants: Fetal type posterior cerebral arteries bilaterally. Delayed phase: Postcontrast images are not performed in the acute setting. Review  of the MIP images confirms the above findings IMPRESSION: 1. Proximal posterior division left M2 occlusion. 2. More distal anterior division left M2/M3 occlusion. 3. Extensive intracranial small vessel disease. 4. Occluded left vertebral artery with minimal reconstitution in the neck. 5. Fetal type posterior cerebral arteries bilaterally. 6. Atherosclerotic changes at the carotid bifurcations and cavernous internal carotid arteries bilaterally without other significant stenosis. 7. Aortic Atherosclerosis (ICD10-I70.0). Calcifications involve the great vessel origins without significant stenoses. 8. Large bilateral pleural effusions, right greater than left. 9. Parenchymal volume loss in the right upper lobe likely related to the effusions. Central mass lesion not excluded. 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 Recommend dedicated CT the chest with contrast as the patient's condition allows. 10. These results were called by telephone at the time of interpretation on 12/03/2017 at 11:02 am to Dr. Roland Rack , who verbally acknowledged these results. Electronically Signed   By: San Morelle M.D.   On: 12/03/2017 11:05   Ct Chest Wo Contrast  Addendum Date: 12/04/2017   ADDENDUM REPORT: 12/04/2017 17:04 ADDENDUM: The upper abdominal section of the original report was inadvertently not included and is dictated below. UPPER ABDOMEN: Liver margin appears irregular. Decreased attenuation in the dome is likely due to dilated hepatic veins. Stones are seen in the gallbladder. Probable adrenal thickening. Visualized portions of the kidneys, spleen, pancreas and stomach are grossly unremarkable. Upper abdominal lymph nodes do not appear enlarged by CT size criteria. Ascites. IMPRESSION: 8. Marginal irregularity of the liver is indicative of cirrhosis. 9.       Cholelithiasis. 10.     Ascites. Electronically Signed   By: Lorin Picket M.D.   On: 12/04/2017 17:04   Result Date:  12/04/2017 CLINICAL DATA:  Pleural effusions. EXAM: CT CHEST WITHOUT CONTRAST TECHNIQUE: Multidetector CT imaging of the chest was performed following the standard protocol without IV contrast. COMPARISON:  Chest radiograph 12/03/2017 and CT chest 01/01/2017. FINDINGS: Cardiovascular: Right IJ central line terminates in the SVC. Atherosclerotic calcification of the arterial vasculature, including coronary arteries. Pulmonary arteries are enlarged. Heart is moderately enlarged with a moderate pericardial effusion. Mediastinum/Nodes: Low internal jugular and mediastinal adenopathy measures up to 1.1 cm in the right paratracheal station. There are calcified mediastinal and bihilar lymph nodes. Distal periesophageal lymph node measures 11 mm, as before. No axillary adenopathy. Lungs/Pleura: Image quality is degraded by respiratory motion. Large right pleural effusion and adjacent collapse/consolidation obscure the majority of the right hemithorax. Nodular areas in the apex of the left upper lobe are grossly stable. Scattered areas of peribronchovascular ground-glass in the left upper lobe, possibly infectious or inflammatory in etiology. Moderate left pleural effusion with collapse/consolidation in the left lower lobe. Endotracheal tube terminates at the orifice of the left mainstem bronchus. Musculoskeletal: Degenerative changes in the spine. No worrisome lytic or sclerotic lesions. IMPRESSION: 1. Endotracheal tube is low lying, at the orifice of the left mainstem bronchus. Pulling back 3-4 cm would likely better position  the tip above the carina. 2. Large right pleural effusion with collapse/consolidation in the adjacent right lung. 3. Moderate left pleural effusion with collapse/consolidation in the left lower lobe. 4. Scattered areas of nodularity and ground-glass in the left upper lobe, poorly evaluated due to respiratory motion. 5. Moderate pericardial effusion. 6. Aortic atherosclerosis (ICD10-170.0). Coronary  artery calcification. 7. Enlarged pulmonary arteries, indicative of pulmonary arterial hypertension. Electronically Signed: By: Lorin Picket M.D. On: 12/04/2017 16:47   Dg Chest Port 1 View  Result Date: 12/04/2017 CLINICAL DATA:  Central line placement EXAM: PORTABLE CHEST 1 VIEW COMPARISON:  Chest radiograph from earlier today. 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 Endotracheal tube tip is 2.4 cm above the carina. Right internal jugular central venous catheter terminates in the middle third of the SVC. Stable cardiomediastinal silhouette with mild cardiomegaly. No pneumothorax. Moderate right pleural effusion, decreased. Stable small left pleural effusion. Improved aeration in the right lung with persistent right greater than left bibasilar lung opacities, stable on the left and decreased on the right. No pulmonary edema. IMPRESSION: 1. Right internal jugular central venous catheter terminates in the middle third of the SVC. Well-positioned endotracheal tube. 2. Moderate right pleural effusion, decreased. Improved aeration in the right lung. 3. Stable small left pleural effusion. 4. Right greater than left bibasilar lung opacities, decreased on the right and stable on the left. Electronically Signed   By: Ilona Sorrel M.D.   On: 12/04/2017 11:04   Dg Chest Port 1 View  Result Date: 12/04/2017 CLINICAL DATA:  Respiratory failure EXAM: PORTABLE CHEST 1 VIEW COMPARISON:  Chest radiograph from one day prior. FINDINGS: Right rotated chest radiograph. Endotracheal tube tip is 1.9 cm above the carina. 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 Stable cardiomediastinal silhouette with mild cardiomegaly. No pneumothorax. Worsening essentially complete opacification of the right hemithorax. Small stable left pleural effusion. No overt pulmonary edema in the aerated left lung. IMPRESSION: 1. Endotracheal tube tip 1.9 cm above the carina. 2. Worsening essentially complete  opacification of the right hemithorax, which could represent any combination of right pleural effusion, right lung atelectasis, pneumonia or mass. 3. Stable small left pleural effusion. Electronically Signed   By: Ilona Sorrel M.D.   On: 12/04/2017 08:41    Impression/Plan: - Acute CVA with left MCA infarct. Status post revascularization. Now with possible small intracranial hemorrhage. - Respiratory failure. Currently intubated. - Cirrhosis.MELD score 18.  Followed by [facility].  - History of atrial fibrillation. Was off anticoagulation because of recent GI bleed. - Pleural effusion. 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 - Chronic iron deficiency anemia. Extensive workup in the past. 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  Recommendations ------------------------ - Neurology  notes reviewed. Currently not a candidate for anticoagulation because of recent intracranial hemorrhage. I think it's okay to start low-dose aspirin from GI standpoint. Consider increasing aspirin to full dose if hemoglobin stable on low-dose aspirin. - Continue diuretics. Currently on Lasix 40 mg IV twice a day and spironolactone 100 mg  Old records reviewed and summarized. Records from care everywhere reviewed. No further inpatient GI workup planned. GI will sign off. Follow-up with primary gastroenterologist at [facility] after discharge.   LOS: 2 days   Otis Brace  MD, FACP 12/05/2017, 10:23 AM  Contact #  [phone]

## 2017-12-05 NOTE — Progress Notes (Signed)
Initial Nutrition Assessment  DOCUMENTATION CODES:   Not applicable  INTERVENTION:   Vital AF 1.2 @ 45 ml/hr 30 ml Prostat BID  Provides: 1496 kcal, 111 grams protein, and 875 ml free water.    NUTRITION DIAGNOSIS:   Inadequate oral intake related to inability to eat as evidenced by NPO status.  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    Pt on vent sitting in chair. Unable to answer any questions.   Patient is currently intubated on ventilator support MV: 7.2 L/min Temp (24hrs), Avg:98.4 F (36.9 C), Min:97.7 F (36.5 C), Max:98.7 F (37.1 C)  Medications reviewed and include: lasix, 20 mEq KCl BID,  Labs reviewed: Na 134 (L)    NUTRITION - FOCUSED PHYSICAL EXAM:    Most Recent Value  Orbital Region  Moderate depletion  Upper Arm Region  No depletion  Thoracic and Lumbar Region  No depletion  Buccal Region  Unable to assess  Temple Region  Severe depletion  Clavicle Bone Region  No depletion  Clavicle and Acromion Bone Region  Mild depletion  Scapular Bone Region  Mild depletion  Dorsal Hand  Unable to assess  Patellar Region  No depletion  Anterior Thigh Region  No depletion  Posterior Calf Region  No depletion  Edema (RD Assessment)  Severe  Hair  Reviewed  Eyes  Reviewed  Mouth  Unable to assess  Skin  Reviewed  Nails  Unable to assess      Diet Order:   Diet Order           Diet NPO time specified  Diet effective now          EDUCATION NEEDS:   No education  needs have been identified at this time  Skin:  Skin Assessment: Reviewed RN Assessment  Last BM:  unknown  Height:   Ht Readings from Last 1 Encounters:  12/03/17 5\' 6"  (1.676 m)    Weight:   Wt Readings from Last 1 Encounters:  12/06/17 181 lb 7 oz (82.3 kg)    Ideal Body Weight:     BMI:  Body mass index is 29.28 kg/m.  Estimated Nutritional Needs:   Kcal:  1529  Protein:  100-120 grams  Fluid:  >2 L/day  Maylon Peppers RD, LDN, CNSC [phone] Pager [phone] After Hours Pager

## 2017-12-05 NOTE — Evaluation (Signed)
Occupational Therapy Evaluation Patient Details Name: Ana Gardner MRN: [id number] DOB: 05/11/1941 Today's Date: 12/05/2017    History of Present Illness 77 y.o. 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 No tPA given due to recent GI bleeding.  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   Clinical Impression   Pt admitted with above. She demonstrates the below listed deficits and will benefit from continued OT to maximize safety and independence with BADLs.  Pt presents to OT with Rt inattention, generalized weakness, impaired balance, decreased activity tolerance.  She will follow one step commands inconsistently, but is able to follow gestural commands to perform automatic, familiar tasks such as standing.  She requires max - total A for all aspects of ADLs, and mod A +2 for pivot transfer to chair.  She was independent PTA, and feel she will need post acute rehab - CIR vs LTACH (dependeing on her respiratory status).        Follow Up Recommendations  CIR;LTACH(depending on respiratory status )    Equipment Recommendations  None recommended by OT    Recommendations for Other Services Rehab consult(vs LTACH)     Precautions / Restrictions Precautions Precautions: Fall Precaution Comments: ET Tube, A line      Mobility Bed Mobility Overal bed mobility: Needs Assistance Bed Mobility: Sidelying to Sit   Sidelying to sit: Mod assist;+2 for physical assistance;+2 for safety/equipment       General bed mobility comments: Moderate physical assist for elevation to upright. Patient was able to scoot LEs to EOB and utilized therapists arm to pull  to sit with increased time and effort to perform. Able to scoot to EOB appropriately. +2 for line management and safety  Transfers Overall transfer level: Needs assistance Equipment used: 2 person hand held assist Transfers: Sit to/from [employer] Transfers Sit to Stand: Mod assist;+2 physical assistance Stand pivot transfers: Mod assist;+2 physical assistance       General transfer comment: Mod assist for stability. (+2 to line management). +2 moderate to maximial assist to pivot to chair with multi modal cues to facilitate weight shift and LE advancement throughout pivotal steps    Balance Overall balance assessment: Needs assistance Sitting-balance support: Feet supported Sitting balance-Leahy Scale: Fair Sitting balance - Comments: able to sit EOB with min guard (assist for line management)   Standing balance support: During functional activity Standing balance-Leahy Scale: Poor Standing balance comment: min assist for stability in standing due to extensive posterior lean                           ADL either performed or assessed with clinical judgement   ADL Overall ADL's : Needs assistance/impaired Eating/Feeding: NPO   Grooming: Wash/dry hands;Wash/dry face;Brushing hair;Maximal assistance;Sitting Grooming Details (indicate cue type and reason): pt does not attempt to reach for comb despite max verbal and tactile cues.  With hand over hand assist, she comb Rt side of head briefly - requires max A to do so  Upper Body Bathing: Total assistance;Bed level   Lower Body Bathing: Total assistance;Sit to/from stand   Upper Body Dressing : Total assistance;Sitting   Lower Body Dressing: Total assistance;Sit to/from stand  Toilet Transfer: Moderate assistance;+2 for physical assistance;+2 for safety/equipment;Stand-pivot;BSC   Toileting- Clothing Manipulation and Hygiene: Total assistance;Sit to/from stand       Functional mobility during ADLs: Moderate  assistance;+2 for physical assistance;+2 for safety/equipment General ADL Comments: Pt with difficulty following commands and sustaining attention to ADL tasks.  She, however, will assist with sit to stand and transfers      Vision   Additional Comments: difficult to assess due to impaired ability to communicate and impaired attention      Perception Perception Perception Tested?: Yes Perception Deficits: Inattention/neglect Inattention/Neglect: Does not attend to right visual field;Does not attend to right side of body Spatial deficits: when asked to raise Rt UE, she repeatedly raises Lt UE   Praxis Praxis Praxis tested?: Deficits Deficits: Initiation    Pertinent Vitals/Pain Pain Assessment: Faces Faces Pain Scale: Hurts a little bit Pain Location: RUE Pain Descriptors / Indicators: Grimacing Pain Intervention(s): Monitored during session     Hand Dominance Right   Extremity/Trunk Assessment Upper Extremity Assessment Upper Extremity Assessment: Difficult to assess due to impaired cognition(pt moves bil UEs spontaneously )   Lower Extremity Assessment Lower Extremity Assessment: Defer to PT evaluation       Communication Communication Communication: Other (comment)(ETT )   Cognition Arousal/Alertness: Awake/alert Behavior During Therapy: WFL for tasks assessed/performed Overall Cognitive Status: Difficult to assess                     Current Attention Level: Focused;Sustained   Following Commands: Follows one step commands inconsistently;Follows one step commands with increased time     Problem Solving: Slow processing;Decreased initiation;Difficulty sequencing;Requires verbal cues;Requires tactile cues General Comments: Pt shows ability to engage once upright. Patient with some consistency to follow basic one step commands 50% of time but shows some deficits in motor planning and recognition of body/self at times. Decreased attention noted. Max multi  modal cues to engaged as needed throughout session. Patient able to carry out basic automatic tasks such as scooting and standing but shows increased time and difficulty carrying out non automatic tasks. Communication remains limited to head nods throughout session.   General Comments       Exercises     Shoulder Instructions      Home Living Family/patient expects to be discharged to:: Private residence Living Arrangements: Alone Available Help at Discharge: Available PRN/intermittently Type of Home: Apartment Home Access: Level entry     Home Layout: One level     Bathroom Shower/Tub: Teacher, early years/pre: Standard     Home Equipment: None   Additional Comments: history taken from previous history      Prior Functioning/Environment Level of Independence: Independent                 OT Problem List: Decreased strength;Decreased range of motion;Decreased activity tolerance;Impaired balance (sitting and/or standing);Impaired vision/perception;Decreased coordination;Decreased cognition;Decreased knowledge of use of DME or AE;Decreased safety awareness;Cardiopulmonary status limiting activity;Impaired UE functional use      OT Treatment/Interventions: Self-care/ADL training;Neuromuscular education;DME and/or AE instruction;Therapeutic activities;Cognitive remediation/compensation;Visual/perceptual remediation/compensation;Patient/family education;Balance training    OT Goals(Current goals can be found in the care plan section) Acute Rehab OT Goals Time For Goal Achievement: 12/19/17 Potential to Achieve Goals: Good ADL Goals Pt Will Perform Grooming: with mod assist;sitting Pt Will Perform Upper Body Bathing: with mod assist;sitting Pt Will Perform Lower Body Bathing: with mod assist;sit to/from stand Pt Will Transfer to Toilet: with min assist;ambulating;regular height toilet;bedside commode;grab bars  Pt Will Perform Toileting - Clothing Manipulation  and hygiene: with mod assist;sit to/from stand Additional ADL Goal #1: Pt will sustain attention to familiar ADL tasks x 4 mins with min cues Additional ADL Goal #2: Pt will locate items on her Rt with min cues during ADLs  OT Frequency: Min 2X/week   Barriers to D/C:            Co-evaluation PT/OT/SLP Co-Evaluation/Treatment: Yes Reason for Co-Treatment: Complexity of the patient's impairments (multi-system involvement);Necessary to address cognition/behavior during functional activity;For patient/therapist safety;To address functional/ADL transfers   OT goals addressed during session: ADL's and self-care      AM-PAC PT "6 Clicks" Daily Activity     Outcome Measure Help from another person eating meals?: Total Help from another person taking care of personal grooming?: A Lot Help from another person toileting, which includes using toliet, bedpan, or urinal?: Total Help from another person bathing (including washing, rinsing, drying)?: Total Help from another person to put on and taking off regular upper body clothing?: Total Help from another person to put on and taking off regular lower body clothing?: Total 6 Click Score: 7   End of Session Equipment Utilized During Treatment: Oxygen;Gait belt Nurse Communication: Mobility status  Activity Tolerance: Patient limited by fatigue Patient left: in chair;with call bell/phone within reach;with chair alarm set;with nursing/sitter in room  OT Visit Diagnosis: Unsteadiness on feet (R26.81);Cognitive communication deficit (R41.841);Hemiplegia and hemiparesis Symptoms and signs involving cognitive functions: Cerebral infarction Hemiplegia - Right/Left: Right Hemiplegia - dominant/non-dominant: Dominant Hemiplegia - caused by: Cerebral infarction                Time: 0927-1003 OT Time Calculation (min): 36 min Charges:  OT General Charges $OT Visit: 1 Visit OT Evaluation $OT Eval High Complexity: 1 High G-Codes:     [facility], OTR/L [phone]   Lucille Passy M 12/05/2017, 10:11 PM

## 2017-12-06 ENCOUNTER — Encounter (HOSPITAL_COMMUNITY): Payer: Self-pay | Admitting: Interventional Radiology

## 2017-12-06 ENCOUNTER — Inpatient Hospital Stay (HOSPITAL_COMMUNITY): Payer: Medicare HMO

## 2017-12-06 DIAGNOSIS — I482 Chronic atrial fibrillation, unspecified: Secondary | ICD-10-CM

## 2017-12-06 DIAGNOSIS — R791 Abnormal coagulation profile: Secondary | ICD-10-CM

## 2017-12-06 LAB — BASIC METABOLIC PANEL
Anion gap: 9 (ref 5–15)
BUN: 24 mg/dL — AB (ref 6–20)
CHLORIDE: 103 mmol/L (ref 101–111)
CO2: 24 mmol/L (ref 22–32)
CREATININE: 1.32 mg/dL — AB (ref 0.44–1.00)
Calcium: 8.4 mg/dL — ABNORMAL LOW (ref 8.9–10.3)
GFR calc Af Amer: 45 mL/min — ABNORMAL LOW (ref >60)
GFR calc non Af Amer: 38 mL/min — ABNORMAL LOW (ref >60)
Glucose, Bld: 225 mg/dL — ABNORMAL HIGH (ref 65–99)
POTASSIUM: 4.2 mmol/L (ref 3.5–5.1)
Sodium: 136 mmol/L (ref 135–145)

## 2017-12-06 LAB — CBC
HEMATOCRIT: 26.1 % — AB (ref 36.0–46.0)
HEMOGLOBIN: 8.3 g/dL — AB (ref 12.0–15.0)
MCH: 22.1 pg — AB (ref 26.0–34.0)
MCHC: 31.8 g/dL (ref 30.0–36.0)
MCV: 69.6 fL — ABNORMAL LOW (ref 78.0–100.0)
Platelets: 313 10*3/uL (ref 150–400)
RBC: 3.75 MIL/uL — AB (ref 3.87–5.11)
RDW: 19.1 % — ABNORMAL HIGH (ref 11.5–15.5)
WBC: 16.1 10*3/uL — ABNORMAL HIGH (ref 4.0–10.5)

## 2017-12-06 LAB — PROTIME-INR
INR: 1.53
Prothrombin Time: 18.3 seconds — ABNORMAL HIGH (ref 11.4–15.2)

## 2017-12-06 LAB — GLUCOSE, CAPILLARY
GLUCOSE-CAPILLARY: 223 mg/dL — AB (ref 65–99)
GLUCOSE-CAPILLARY: 171 mg/dL — AB (ref 65–99)

## 2017-12-06 LAB — PHOSPHORUS: Phosphorus: 3.7 mg/dL (ref 2.5–4.6)

## 2017-12-06 LAB — MAGNESIUM: Magnesium: 2 mg/dL (ref 1.7–2.4)

## 2017-12-06 MED ORDER — INSULIN ASPART 100 UNIT/ML ~~LOC~~ SOLN: 0.0000 [IU] | Freq: Three times a day (TID) | SUBCUTANEOUS | Status: DC

## 2017-12-06 MED ORDER — PANTOPRAZOLE SODIUM 40 MG PO TBEC
40.0000 mg | DELAYED_RELEASE_TABLET | Freq: Every day | ORAL | Status: DC
  Administered 2017-12-06 – 2017-12-15 (×10): 40 mg via ORAL
  Filled 2017-12-06 (×11): qty 1

## 2017-12-06 MED ORDER — DOCUSATE SODIUM 50 MG/5ML PO LIQD
100.0000 mg | Freq: Two times a day (BID) | ORAL | Status: DC | PRN
  Administered 2017-12-11: 100 mg via ORAL
  Filled 2017-12-06: qty 10

## 2017-12-06 MED ORDER — POTASSIUM CHLORIDE 20 MEQ/15ML (10%) PO SOLN
20.0000 meq | Freq: Two times a day (BID) | ORAL | Status: DC
  Administered 2017-12-06 (×2): 20 meq via ORAL
  Filled 2017-12-06 (×2): qty 15

## 2017-12-06 MED ORDER — SPIRONOLACTONE 25 MG PO TABS
100.0000 mg | ORAL_TABLET | Freq: Every day | ORAL | Status: DC
  Administered 2017-12-06 – 2017-12-08 (×3): 100 mg via ORAL
  Filled 2017-12-06: qty 4
  Filled 2017-12-06 (×2): qty 1

## 2017-12-06 MED ORDER — INSULIN ASPART 100 UNIT/ML ~~LOC~~ SOLN
0.0000 [IU] | Freq: Three times a day (TID) | SUBCUTANEOUS | Status: DC
  Administered 2017-12-06: 5 [IU] via SUBCUTANEOUS
  Administered 2017-12-07: 3 [IU] via SUBCUTANEOUS
  Administered 2017-12-07: 2 [IU] via SUBCUTANEOUS
  Administered 2017-12-07: 3 [IU] via SUBCUTANEOUS
  Administered 2017-12-08: 5 [IU] via SUBCUTANEOUS
  Administered 2017-12-08 – 2017-12-09 (×2): 2 [IU] via SUBCUTANEOUS
  Administered 2017-12-09 – 2017-12-10 (×3): 5 [IU] via SUBCUTANEOUS
  Administered 2017-12-11: 3 [IU] via SUBCUTANEOUS
  Administered 2017-12-11: 2 [IU] via SUBCUTANEOUS
  Administered 2017-12-11 – 2017-12-12 (×2): 3 [IU] via SUBCUTANEOUS
  Administered 2017-12-12 (×2): 2 [IU] via SUBCUTANEOUS
  Administered 2017-12-13 (×3): 3 [IU] via SUBCUTANEOUS
  Administered 2017-12-14: 2 [IU] via SUBCUTANEOUS
  Administered 2017-12-14: 3 [IU] via SUBCUTANEOUS
  Administered 2017-12-14: 2 [IU] via SUBCUTANEOUS
  Administered 2017-12-14 – 2017-12-15 (×2): 3 [IU] via SUBCUTANEOUS
  Administered 2017-12-15: 2 [IU] via SUBCUTANEOUS

## 2017-12-06 NOTE — Progress Notes (Signed)
Patient was expected to void by 1700 post removal of foley catheter at 1100. Patient has been prompted many times to void throughout shift but patient stated every time that she was unable to do so. Bladder scan was obtained and revealed <52ml's was in her bladder. Dr Pati Gallo was notified and wants to continue to monitor patient throughout the evening into the night for urine output. And also to encourage fluid intake. Will continue to monitor patient.

## 2017-12-06 NOTE — Progress Notes (Addendum)
PULMONARY / CRITICAL CARE MEDICINE   Name: Ana Gardner MRN: [id number] DOB: 1941-07-04    ADMISSION DATE:  12/03/2017 CONSULTATION DATE:  12/03/2017  REFERRING MD:  Estanislado Pandy   CHIEF COMPLAINT:  Right sided weakness   HISTORY OF PRESENT ILLNESS:   77 y.o. 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  Patient walked into to the [facility] ED around 8am today with complaints of shortness of breath and edema in her bilateral lower extremities over the last month.    patient was discharged 11/29/17, with complaints of shortness of breath secondary to bilateral pleural effusion -right more than left, likely related to hypervolemic state from congestive heart failure and cirrhosis of liver. Ultrasound guided thoracentesis done 11/28/2017 with 1 L of pleural fluid drained right and another thoracentesis done on 11/29/2017 650 mL of fluid with drained from the left pleural space. She was started on torsemide and metolazone.  Patient presented to the ER, x-ray of the chest showed large recurrent right pleural effusion as well as underlying consolidation/atelectasis of the right lung. Today around 8:30 AM in the ED patient became acutely aphasic with dense right hemiplegia as well as forced left gaze deviation.  Emergent telemetry stroke was consulted at that time patient had NIHSS of 26.  Immediate CT Noncon was done that shows infarct involving the high right parietal lobe and  left frontal operculum.  Patient could not undergo CT angiogram at that time due to respiratory status.  , she was unable to follow commands or protect her airway and was intubated immediately. Patient was transferred immediately to [facility] for further stroke management. On arrival to [facility] patient immediately went to CT.  CT head/neck Angio  Showed proximal posterior division left M2 occlusion, More distal anterior division left M2/M3 occlusion as well aso occluded left vertebral artery with minimal reconstitution in the neck.  Neuro interventionalist was consulted immediately for endovascular revascularization Patient underwent and underwent revascularization of the left MCA.  Patient brought back to the ICU intubated.  SUBJECTIVE:  Self extubated this AM.  Tolerating well so far.     VITAL SIGNS: BP (!) 111/55   Pulse 96   Temp 97.7 F (36.5 C) (Axillary)   Resp 16   Ht _0  (1.676 m)   Wt 82.3 kg (181 lb 7 oz)   SpO2 100%   BMI 29.28 kg/m    VENTILATOR SETTINGS: Vent Mode: PSV FiO2 (%):  [40 %] 40 % Set Rate:  [16 bmp] 16 bmp Vt Set:  [460 mL] 460 mL PEEP:  [5 cmH20] 5 cmH20 Pressure Support:  [10 cmH20] 10 cmH20 Plateau Pressure:  [9 cmH20-20 cmH20] 16 cmH20  INTAKE / OUTPUT: I/O last 3 completed shifts: In: 1102.4 [I.V.:554.9; NG/GT:547.5] Out: 3500 [Urine:3500]  PHYSICAL EXAMINATION: General: Adult female, resting in bed, in NAD Neuro: Awake, nods head appropriately, mouths words. HEENT:  [name] / AT.  MMM.  ETT in place Cardiovascular:  RRR, 3/6 SEM Lungs:  Clear bilaterally though diminished in bases Abdomen:  Soft no tenderness  Musculoskeletal:  +  edema lower limbs  Skin:  No rash  LABS:  BMET Recent Labs  Lab 12/04/17 2046 12/05/17 0450 12/06/17 0431  NA 133* 134* 136  K 3.4* 3.6 4.2  CL 101 101 103  CO2 _0 BUN 29* 26* 24*  CREATININE 1.55* 1.43* 1.32*  GLUCOSE 174* 163* 225*    Electrolytes Recent Labs  Lab 12/04/17 2046  12/05/17 0450 12/05/17 1854 12/06/17 0431  CALCIUM 8.1* 8.2*  --  8.4*  MG  --  1.8 1.8 2.0  PHOS  --   --  3.5 3.7    CBC Recent Labs  Lab 12/03/17 0909 12/03/17 1139 12/04/17 0417 12/06/17 0431  WBC 7.1  --  19.0* 16.1*  HGB 10.1* 11.2* 9.1* 8.3*  HCT 28.6* 33.0* 28.1* 26.1*  PLT 432*  --  408* 313    Coag's Recent Labs  Lab 12/04/17 2046 12/05/17 0450 12/06/17 0431  INR 1.85 1.76 1.53    Sepsis Markers Recent Labs  Lab 12/05/17 0450  PROCALCITON 0.50    ABG Recent Labs  Lab 12/03/17 0942 12/03/17 1139 12/03/17 2341  PHART 7.546* 7.516* 7.431  PCO2ART 32.0 34.8 34.0  PO2ART 140.0* 250.0* 88.2    Liver Enzymes Recent Labs  Lab 12/03/17 0909  AST 19  ALT 10*  ALKPHOS 48  BILITOT 1.0  ALBUMIN 2.8*    Cardiac Enzymes Recent Labs  Lab 12/03/17 0909  TROPONINI <0.03    Glucose Recent Labs  Lab 12/03/17 0839 12/03/17 0916 12/03/17 1010 12/03/17 1327  GLUCAP 149* 118* 113* 120*   Vent Mode: PSV FiO2 (%):  [40 %] 40 % Set Rate:  [16 bmp] 16 bmp Vt Set:  [460 mL] 460 mL PEEP:  [5 cmH20] 5 cmH20 Pressure Support:  [10 cmH20] 10 cmH20 Plateau Pressure:  [9 cmH20-20 cmH20] 16 cmH20    Current Facility-Administered Medications:  .  0.9 %  sodium chloride infusion, 250 mL, Intravenous, PRN, Aldean Jewett, MD, Last Rate: 10 mL/hr at 12/06/17 0700 .  Place/Maintain arterial line, , , Until Discontinued **AND** 0.9 %  sodium chloride infusion, , Intra-arterial, PRN, Deterding, Guadelupe Sabin, MD .  acetaminophen (TYLENOL) tablet 650 mg, 650 mg, Oral, Q4H PRN **OR** acetaminophen (TYLENOL) solution 650 mg, 650 mg, Per Tube, Q4H PRN **OR** acetaminophen (TYLENOL) suppository 650 mg, 650 mg, Rectal, Q4H PRN, Deveshwar, Sanjeev, MD .  chlorhexidine gluconate (MEDLINE KIT) (PERIDEX) 0.12 % solution 15 mL, 15 mL, Mouth Rinse, BID, Aljishi, Virgina Norfolk, MD, 15 mL at 12/05/17 1958 .  Chlorhexidine Gluconate Cloth 2 % PADS 6 each, 6 each, Topical, Daily,  Ollis, Brandi L, NP, 6 each at 12/05/17 2100 .  docusate (COLACE) 50 MG/5ML liquid 100 mg, 100 mg, Per Tube, BID PRN, Aljishi, Wael Z, MD .  feeding supplement (PRO-STAT SUGAR FREE 64) liquid 30 mL, 30 mL, Per Tube, BID, Mattye Verdone P, MD, 30 mL at 12/05/17 2152 .  feeding supplement (VITAL AF 1.2 CAL) liquid 1,000 mL, 1,000 mL, Per Tube, Continuous, Kenniel Bergsma P, MD, Last Rate: 45 mL/hr at 12/06/17 0700 .  fentaNYL (SUBLIMAZE) injection 50 mcg, 50 mcg, Intravenous, Q15 min PRN, Aljishi, Wael Z, MD .  fentaNYL (SUBLIMAZE) injection 50 mcg, 50 mcg, Intravenous, Q2H PRN, Aljishi, Wael Z, MD .  fentaNYL 256mg in NS 2586m(1021mml) infusion-PREMIX, 0-400 mcg/hr, Intravenous, Continuous, Hammonds, KatSharyn BlitzD, Stopped at 12/06/17 [id number] furosemide (LASIX) injection 40 mg, 40 mg, Intravenous, Q12H, Desai, Rahul P, PA-C, 40 mg at 12/05/17  2307 .  MEDLINE mouth rinse, 15 mL, Mouth Rinse, 10 times per day, Aldean Jewett, MD, 15 mL at 12/06/17 0611 .  nicardipine (CARDENE) '20mg'$  in 0.86% saline 21m IV infusion (0.1 mg/ml), 0-15 mg/hr, Intravenous, Continuous, Deveshwar, Sanjeev, MD .  norepinephrine (LEVOPHED) 16 mg in dextrose 5 % 250 mL (0.064 mg/mL) infusion, 0-40 mcg/min, Intravenous, Titrated, [facility]keet Simmer [facility] Last Rate: 7.5 mL/hr at 12/06/17 0741, 8 mcg/min at 12/06/17 0741 .  omeprazole (PRILOSEC) 2 mg/mL oral suspension SUSP 40 mg, 40 mg, Per Tube, Daily, Meli Faley P, MD, 40 mg at 12/05/17 1638 .  potassium chloride 20 MEQ/15ML (10%) solution 20 mEq, 20 mEq, Per Tube, BID, Desai, Rahul P, PA-C, 20 mEq at 12/05/17 2152 .  sodium chloride flush (NS) 0.9 % injection 10-40 mL, 10-40 mL, Intracatheter, Q12H, Ollis, Brandi L, NP, 30 mL at 12/05/17 2100 .  sodium chloride flush (NS) 0.9 % injection 10-40 mL, 10-40 mL, Intracatheter, PRN, Ollis, Brandi L, NP .  spironolactone (ALDACTONE) tablet 100 mg, 100 mg, Per Tube, Daily, Desai, Rahul P, PA-C, 100 mg at 12/05/17  1213   Imaging Dg Abd Portable 1v  Result Date: 12/05/2017 CLINICAL DATA:  Status post nasogastric tube placement. The patient is intubated and has sustained a CVA. History of CHF. EXAM: PORTABLE ABDOMEN - 1 VIEW COMPARISON:  Chest x-ray of Dec 04, 2017 FINDINGS: There is volume loss on the right. There is increased density in the retrocardiac region on the left. There is dense calcification in the mitral valvular annulus. The gas pattern in the upper abdomen is unremarkable. The esophagogastric tube tip in proximal port project in the region of the gastric body. The endotracheal tube tip lies approximately 3 cm above the carina. The right internal jugular venous catheter tip projects over the midportion of the SVC. The ICD is in stable position. The heart is top-normal in size. The pulmonary vascularity is normal. There is calcification in the wall of the aortic arch. IMPRESSION: Improved aeration of the right lung. Persistent right pleural effusion and right basilar atelectasis or pneumonia. Persistent left lower lobe atelectasis or pneumonia. No pulmonary edema. The support tubes including the newly placed esophagogastric tube are in reasonable position. Thoracic aortic atherosclerosis. Electronically Signed   By: David  JMartiniqueM.D.   On: 12/05/2017 11:21     ekg- 12-03-17 paced rhythm   Echo  12-04-17 - Left ventricle: The cavity size was below normal. Wall thickness   was increased in a pattern of mild LVH. Systolic function was   vigorous. The estimated ejection fraction was in the range of 65%   to 70%. Wall motion was normal; there were no regional wall   motion abnormalities. Doppler parameters are consistent with   abnormal left ventricular relaxation (grade 1 diastolic   dysfunction). Doppler parameters are consistent with high   ventricular filling pressure. - Ventricular septum: The contour showed diastolic flattening and   systolic flattening. These changes are consistent with RV  volume   and pressure overload. - Aortic valve: Mildly calcified annulus. Trileaflet. - Mitral valve: Severely calcified annulus. Severely calcified   leaflets . The findings are consistent with mild stenosis. Mean   gradient (D): 4 mm Hg. Valve area by pressure half-time: 2.12   cm^2. Valve area by continuity equation (using LVOT flow): 0.81   cm^2. - Right ventricle: The cavity size was severely dilated. Pacer wire   or catheter noted in right ventricle. Systolic function was   reduced. -  Right atrium: The atrium was severely dilated. Pacer wire or   catheter noted in right atrium. - Tricuspid valve: There was severe regurgitation. - Pulmonic valve: There was mild regurgitation. - Pulmonary arteries: Systolic pressure was severely increased. PA   peak pressure: 71 mm Hg (S). - Inferior vena cava: The CVP could not accurately be assessed as   the patient was ventilated (via IVC assessment). - Pericardium, extracardiac: A moderate to large size pericardial   effusion was noted. Features were not consistent with tamponade   physiology.   STUDIES:  CT Head 5/5 > persistent hyperdensity at left insula, hemorrhage is favored.  No new acute process. CT chest 5/5 > bilateral pleural effusions R > L, mod pericardial effusion, cirrhosis, ascites. Echo 5/5 > EF 65-70%, G1DD, RV overload, mod to large pericardial effusion without tamponade physiology  SIGNIFICANT EVENTS: Intubated 12/03/2017  Self extubated 5/7   ASSESSMENT / PLAN:  PULMONARY A: Ventilatory dependent respiratory failure secondary to acute hypoxemic respiratory failure secondary to bilateral large pleural effusions secondary to acute on chronic right-sided heart failure and cirrhosis.  Self extubated 5/7, tolerated well. Acute pulm edema - due to above Hx of pulm sarcoid   P:   Bronchial hygiene Continue supplemental O2 as needed to maintain SpO2 > 92% Continue with lasix BID and spironolactone 135m daily Would  continue with diuresis and avoid thora for now given almost certain recurrence (of note, just had R and L thora 4/29 and 4/30.  Unable to view labs from those procedures) Follow CXR intermittently.  CARDIOVASCULAR A:  Acute diastolic CHF preserved EF Acute pulm edema Paroxysmal afib off anticoagulation  Moderate pericardial effusion P:  Continue diuresis - spiro added 5/6.  Assess response - might need doses increased No anticoagulation at this time due to recent GI bleed ASA on hold, continue to hold until cleared by neurology (planning for repeat CT head first) Continue levophed, wean to off as able  RENAL A:   AKI secondary to cardiorenal Hyponatremia secondary to volume overload - resolved. P:   Continue with IV diuresis.  Monitor urine output. 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  Neurology following. Antiplatelet therapy as per neurology.  Monitor in ICU this AM after self extubation earlier.  If remains stable, can transfer out later this afternoon to SDU and ask TRH to assume care starting in AM 5/8 with PCCM off at that time.   FAMILY  - No family available 5/6 - Inter-disciplinary family meet or Palliative Care meeting due by: 12/09/2017    RMontey Hora PCloverPulmonary & Critical Care Medicine Pager: [phone] or ([phone]/01/2018, 8:44 AM   ^^^^^^^^^^ P CCM Attending Note I have seen and examined  the patient. Agree with APP's Assessment and plan. Please see my comments as follows.  77 y/o female  with  Cardiomyopathy, PHT, A fib, PPM, h/o Sarcoidosis, Cor Pulmonale, recent GI bleed, off AC, recent M2M3 occlusion, cvca. 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 Wean pressor; Continue  diuresis. 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 GI  F/u noted. Monitor renal fxn. Cardiology input reg. Pericardial effusion.> monitor for tamponade. Supportive care. Prognosis guarded.  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  POCUS- pericardial effusion (poor views), bilateral R>L pleural effusions.   Spoke to daughter  5/6 - Janette Harvie- updated current critical condition; she plans to visit on 5/8 Wed.  Once off pessors, she may be able to go to [facility] bed within next 24 hrs.   Thank you for letting me participate in the care of your patient. ^^^^^^^^^^ I  Have personally spent   50 Minutes  In the care of this Patient providing Critical care Services; Time includes review of chart, labs, imaging, coordinating care with other physicians and healthcare team members. Also includes time for frequent reevaluation and additional treatment implementation due to change in clinical condiiton of patient. Excludes time  spent for Procedure and Teaching.  ^^^^^^^^^^ Note subject to typographical and grammatical errors;   Any formal questions or concerns about the content, text, or information contained within the body of this dictation should be directly addressed to the physician  for  clarification.  Evans Lance, MD [facility] Pager: [phone]

## 2017-12-06 NOTE — Progress Notes (Signed)
Patient's foley catheter was removed per order at 1100. Patient is expected to void by 1700 (six hours post catheter removal). Will continue to monitor at this time.

## 2017-12-06 NOTE — Progress Notes (Signed)
STROKE TEAM PROGRESS NOTE   SUBJECTIVE (INTERVAL HISTORY) Her RN and Dr. Pati Gallo are at the bedside.  Pt awake alert and self-extubated this am. So far tolerating well. No procedure done yesterday. Her INR today down to 1.53. Cre continues to improve today 1.32. Still has leukocytosis. She moves all extremities, but has mild expressive aphasia with perseverations, but able to repeat.    OBJECTIVE Temp:  [97.7 F (36.5 C)-98.7 F (37.1 C)] 97.8 F (36.6 C) (05/07 0800) Pulse Rate:  [80-99] 96 (05/07 0715) Cardiac Rhythm: Normal sinus rhythm (05/07 0400) Resp:  [15-19] 16 (05/07 0715) BP: (51-184)/(24-166) 111/55 (05/07 0700) SpO2:  [100 %] 100 % (05/07 0715) Arterial Line BP: (88-134)/(44-62) 115/56 (05/07 0715) FiO2 (%):  [40 %] 40 % (05/07 0740) Weight:  [181 lb 7 oz (82.3 kg)] 181 lb 7 oz (82.3 kg) (05/07 0500)  Recent Labs  Lab 12/03/17 0839 12/03/17 0916 12/03/17 1010 12/03/17 1327  GLUCAP 149* 118* 113* 120*   Recent Labs  Lab 12/03/17 0909 12/03/17 1139 12/04/17 0417 12/04/17 2046 12/05/17 0450 12/05/17 1854 12/06/17 0431  NA 128* 132* 133* 133* 134*  --  136  K 4.2 3.8 4.0 3.4* 3.6  --  4.2  CL 94*  --  99* 101 101  --  103  CO2 21*  --  23 24 24   --  24  GLUCOSE 137*  --  177* 174* 163*  --  225*  BUN 51*  --  39* 29* 26*  --  24*  CREATININE 2.04*  --  1.98* 1.55* 1.43*  --  1.32*  CALCIUM 8.4*  --  7.9* 8.1* 8.2*  --  8.4*  MG  --   --   --   --  1.8 1.8 2.0  PHOS  --   --   --   --   --  3.5 3.7   Recent Labs  Lab 12/03/17 0909  AST 19  ALT 10*  ALKPHOS 48  BILITOT 1.0  PROT 5.8*  ALBUMIN 2.8*   Recent Labs  Lab 12/03/17 0909 12/03/17 1139 12/04/17 0417 12/06/17 0431  WBC 7.1  --  19.0* 16.1*  NEUTROABS 4.5  --  15.9*  --   HGB 10.1* 11.2* 9.1* 8.3*  HCT 28.6* 33.0* 28.1* 26.1*  MCV 68.1*  --  69.7* 69.6*  PLT 432*  --  408* 313   Recent Labs  Lab 12/03/17 0909  TROPONINI <0.03   Recent Labs    12/04/17 2046 12/05/17 0450  12/06/17 0431  LABPROT 21.2* 20.3* 18.3*  INR 1.85 1.76 1.53   Recent Labs    12/03/17 1726  COLORURINE STRAW*  LABSPEC 1.015  PHURINE 9.0*  GLUCOSEU NEGATIVE  HGBUR NEGATIVE  BILIRUBINUR NEGATIVE  KETONESUR NEGATIVE  PROTEINUR NEGATIVE  NITRITE NEGATIVE  LEUKOCYTESUR NEGATIVE       Component Value Date/Time   CHOL 70 12/04/2017 0417   TRIG 72 12/04/2017 0417   HDL 18 (L) 12/04/2017 0417   CHOLHDL 3.9 12/04/2017 0417   VLDL 14 12/04/2017 0417   LDLCALC 38 12/04/2017 0417   Lab Results  Component Value Date   HGBA1C 8.6 (H) 12/04/2017      Component Value Date/Time   LABOPIA NONE DETECTED 12/03/2017 1726   COCAINSCRNUR NONE DETECTED 12/03/2017 1726   LABBENZ POSITIVE (A) 12/03/2017 1726   AMPHETMU NONE DETECTED 12/03/2017 1726   THCU NONE DETECTED 12/03/2017 1726   LABBARB NONE DETECTED 12/03/2017 1726    Recent Labs  Lab  12/03/17 0909  ETH <10    I have personally reviewed the radiological images below and agree with the radiology interpretations.  Ct Angio Head W Or Wo Contrast  Result Date: 12/03/2017 CLINICAL DATA:  Code stroke.  Acute onset of right-sided weakness. EXAM: CT ANGIOGRAPHY HEAD AND NECK TECHNIQUE: Multidetector CT imaging of the head and neck was performed using the standard protocol during bolus administration of intravenous contrast. Multiplanar CT image reconstructions and MIPs were obtained to evaluate the vascular anatomy. Carotid stenosis measurements (when applicable) are obtained utilizing NASCET criteria, using the distal internal carotid diameter as the denominator. CONTRAST:  53mL ISOVUE-370 IOPAMIDOL (ISOVUE-370) INJECTION 76% COMPARISON:  CT head without contrast from the same day at 8:46 a.m. at [facility]. FINDINGS: CT HEAD FINDINGS Brain: Repeat noncontrast CT of the head again demonstrates remote infarcts of the left frontal operculum, left caudate head, and high right parietal lobe. Remote posterior left  cerebellar infarcts are again seen. Anterior left insular hypoattenuation may be slightly worse than on the prior exam. The precentral gyrus is intact. Vascular: Atherosclerotic calcifications are again noted within the cavernous internal carotid arteries bilaterally. There is no hyperdense vessel. Skull: Calvarium is intact. No focal lytic or blastic lesions are present. Sinuses: Paranasal sinuses and mastoid air cells are clear. Orbits: Remote blowout fracture is again noted in the right orbit. Right lens replacement is present. Globes and orbits are otherwise within normal limits. Review of the MIP images confirms the above findings CTA NECK FINDINGS Aortic arch: A 3 vessel arch configuration is present. Atherosclerotic calcifications are present at the origins of the great vessels without a significant stenosis. There is no aneurysm. Right carotid system: The right common carotid artery is within normal limits. Atherosclerotic changes are noted in the proximal right ICA. More distal right internal carotid artery is within normal limits. Left carotid system: The left common carotid artery is within normal limits. Atherosclerotic calcifications are present at the carotid bifurcation proximal left ICA without significant stenosis. There is mild tortuosity of the more distal left ICA without significant stenosis. Vertebral arteries: Atherosclerotic calcifications are present at the origin of the right vertebral artery without significant stenosis. The left vertebral artery is occluded at its origin. There is very faint reconstitution at the C2-3 level without intraluminal contrast at the foramen magnum. Skeleton: Rightward curvature is present in the lower cervical spine, compensating for leftward curvature in the upper thoracic spine. Other neck: Heterogeneous thyroid is present without a dominant lesion. Patient is intubated. No significant cervical adenopathy is present. Salivary glands are within normal limits.  Upper chest: A large right pleural effusion is again seen. The moderate left-sided pleural effusion is present. There is partial collapse of the right upper lobe. Possible soft tissue fullness is present at the right hila. Recommend dedicated CT of the chest with contrast as tolerated. Review of the MIP images confirms the above findings CTA HEAD FINDINGS Anterior circulation: Atherosclerotic calcifications are present within the cavernous internal carotid arteries bilaterally. There is no significant luminal stenosis relative to the more distal vessels through the ICA terminus. The M1 segments are intact bilaterally. No significant proximal occlusion is present on the right. There is moderate attenuation of superior and posterior M2 divisions. The left M1 segment is intact. Proximal superior left M2 and M3 segment is occluded or highly stenotic. The posterior left M2 division is not visualized. Posterior circulation: The right vertebral artery feeds the basilar. PICA origin is visualized. The basilar  artery is small centrally terminating at the superior cerebellar arteries. Fetal type posterior cerebral arteries are present bilaterally with moderate distal segmental stenoses but no significant proximal stenosis or occlusion. Venous sinuses: Dural sinuses are patent. Straight sinus and deep cerebral veins are intact. Cortical veins are unremarkable. Anatomic variants: Fetal type posterior cerebral arteries bilaterally. Delayed phase: Postcontrast images are not performed in the acute setting. Review of the MIP images confirms the above findings IMPRESSION: 1. Proximal posterior division left M2 occlusion. 2. More distal anterior division left M2/M3 occlusion. 3. Extensive intracranial small vessel disease. 4. Occluded left vertebral artery with minimal reconstitution in the neck. 5. Fetal type posterior cerebral arteries bilaterally. 6. Atherosclerotic changes at the carotid bifurcations and cavernous internal  carotid arteries bilaterally without other significant stenosis. 7. Aortic Atherosclerosis (ICD10-I70.0). Calcifications involve the great vessel origins without significant stenoses. 8. Large bilateral pleural effusions, right greater than left. 9. Parenchymal volume loss in the right upper lobe likely related to the effusions. Central mass lesion not excluded. 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 Recommend dedicated CT the chest with contrast as the patient's condition allows. 10. These results were called by telephone at the time of interpretation on 12/03/2017 at 11:02 am to Dr. Roland Rack , who verbally acknowledged these results. Electronically Signed   By: San Morelle M.D.   On: 12/03/2017 11:05   Ct Head Wo Contrast  Result Date: 12/04/2017 CLINICAL DATA:  Follow-up exam for acute stroke, status post catheter directed revascularization. EXAM: CT HEAD WITHOUT CONTRAST TECHNIQUE: Contiguous axial images were obtained from the base of the skull through the vertex without intravenous contrast. COMPARISON:  Prior CT from earlier the same day. FINDINGS: Brain: Previously noted hyperdensity at the level of the left insula again seen, measuring 10 x 12 x 14 mm (transverse by AP by craniocaudad). Overall, this is similar in size with slightly decreased attenuation as compared to previous. Given persistence, hemorrhage is favored, although a degree of contrast staining may be contributory. Evolving acute left MCA territory infarcts within the adjacent all left frontal operculum and posterior frontal region again seen, slightly more apparent on current exam. No mass effect. No other acute intracranial hemorrhage. No other acute large vessel territory infarct. No mass lesion or midline shift. No hydrocephalus. No extra-axial fluid collection. Underlying atrophy with chronic small vessel ischemic changes again noted. Vascular: No new hyperdense vessel. Scattered vascular  calcifications noted within the carotid siphons. Skull: No acute scalp soft tissue abnormality.  Calvarium intact. Sinuses/Orbits: Globes and orbital soft tissues within normal limits. Small air-fluid levels noted within the sphenoid sinuses. Scattered mucosal thickening throughout the ethmoidal air cells and maxillary sinuses. Trace right mastoid effusion. Other: None. IMPRESSION: 1. Persistent hyperdensity at the left insula, similar in size measuring up to 14 mm with slightly decreased attenuation. Hemorrhage is favored. 2. Additional multifocal areas of evolving left MCA territory infarction, stable in distribution as compared to previous. 3. No other new acute intracranial abnormality. Electronically Signed   By: Jeannine Boga M.D.   On: 12/04/2017 14:29   Ct Head Wo Contrast  Result Date: 12/04/2017 CLINICAL DATA:  77 y/o F; stroke post catheter thrombectomy for follow-up. EXAM: CT HEAD WITHOUT CONTRAST TECHNIQUE: Contiguous axial images were obtained from the base of the skull through the vertex without intravenous contrast. COMPARISON:  11/04/2017 CT head, CTA head, angiogram head. FINDINGS: Brain: Amorphous density within the left anterior insula measuring 10 x 9 x 16 mm (AP x ML  x CC series 6, image 17 and series 5, image 46). Hypoattenuation within the left lateral frontal lobe, frontal operculum, and insula. Small chronic infarcts in the posterior left cerebellum, left caudate head, right parietal lobe, and right frontal operculum. No hydrocephalus, extra-axial collection, or effacement of basilar cisterns. Vascular: Calcific atherosclerosis of carotid siphons. Skull: Normal. Negative for fracture or focal lesion. Sinuses/Orbits: Chronic right lamina papyracea fracture. Partial opacification of paranasal sinuses likely due to recent intubation. Other: None. IMPRESSION: 1. Amorphous density in left anterior insula measuring up to 16 mm, probably enhancing infarction, although hemorrhage is  possible. Follow-up is recommended to ensure stability. 2. Additional area of hypoattenuation within left lateral frontal lobe, frontal operculum, insula compatible with evolving infarction. 3. Multiple additional stable chronic infarcts as above. These results were called by telephone at the time of interpretation on 12/04/2017 at 5:31 am to Dr. Rory Percy, who verbally acknowledged these results. Electronically Signed   By: Kristine Garbe M.D.   On: 12/04/2017 05:33   Ct Angio Neck W Or Wo Contrast  Result Date: 12/03/2017 CLINICAL DATA:  Code stroke.  Acute onset of right-sided weakness. EXAM: CT ANGIOGRAPHY HEAD AND NECK TECHNIQUE: Multidetector CT imaging of the head and neck was performed using the standard protocol during bolus administration of intravenous contrast. Multiplanar CT image reconstructions and MIPs were obtained to evaluate the vascular anatomy. Carotid stenosis measurements (when applicable) are obtained utilizing NASCET criteria, using the distal internal carotid diameter as the denominator. CONTRAST:  41mL ISOVUE-370 IOPAMIDOL (ISOVUE-370) INJECTION 76% COMPARISON:  CT head without contrast from the same day at 8:46 a.m. at [facility], [facility]. FINDINGS: CT HEAD FINDINGS Brain: Repeat noncontrast CT of the head again demonstrates remote infarcts of the left frontal operculum, left caudate head, and high right parietal lobe. Remote posterior left cerebellar infarcts are again seen. Anterior left insular hypoattenuation may be slightly worse than on the prior exam. The precentral gyrus is intact. Vascular: Atherosclerotic calcifications are again noted within the cavernous internal carotid arteries bilaterally. There is no hyperdense vessel. Skull: Calvarium is intact. No focal lytic or blastic lesions are present. Sinuses: Paranasal sinuses and mastoid air cells are clear. Orbits: Remote blowout fracture is again noted in the right orbit. Right lens replacement is present.  Globes and orbits are otherwise within normal limits. Review of the MIP images confirms the above findings CTA NECK FINDINGS Aortic arch: A 3 vessel arch configuration is present. Atherosclerotic calcifications are present at the origins of the great vessels without a significant stenosis. There is no aneurysm. Right carotid system: The right common carotid artery is within normal limits. Atherosclerotic changes are noted in the proximal right ICA. More distal right internal carotid artery is within normal limits. Left carotid system: The left common carotid artery is within normal limits. Atherosclerotic calcifications are present at the carotid bifurcation proximal left ICA without significant stenosis. There is mild tortuosity of the more distal left ICA without significant stenosis. Vertebral arteries: Atherosclerotic calcifications are present at the origin of the right vertebral artery without significant stenosis. The left vertebral artery is occluded at its origin. There is very faint reconstitution at the C2-3 level without intraluminal contrast at the foramen magnum. Skeleton: Rightward curvature is present in the lower cervical spine, compensating for leftward curvature in the upper thoracic spine. Other neck: Heterogeneous thyroid is present without a dominant lesion. Patient is intubated. No significant cervical adenopathy is present. Salivary glands are within normal limits. Upper chest: A large  right pleural effusion is again seen. The moderate left-sided pleural effusion is present. There is partial collapse of the right upper lobe. Possible soft tissue fullness is present at the right hila. Recommend dedicated CT of the chest with contrast as tolerated. Review of the MIP images confirms the above findings CTA HEAD FINDINGS Anterior circulation: Atherosclerotic calcifications are present within the cavernous internal carotid arteries bilaterally. There is no significant luminal stenosis relative to  the more distal vessels through the ICA terminus. The M1 segments are intact bilaterally. No significant proximal occlusion is present on the right. There is moderate attenuation of superior and posterior M2 divisions. The left M1 segment is intact. Proximal superior left M2 and M3 segment is occluded or highly stenotic. The posterior left M2 division is not visualized. Posterior circulation: The right vertebral artery feeds the basilar. PICA origin is visualized. The basilar artery is small centrally terminating at the superior cerebellar arteries. Fetal type posterior cerebral arteries are present bilaterally with moderate distal segmental stenoses but no significant proximal stenosis or occlusion. Venous sinuses: Dural sinuses are patent. Straight sinus and deep cerebral veins are intact. Cortical veins are unremarkable. Anatomic variants: Fetal type posterior cerebral arteries bilaterally. Delayed phase: Postcontrast images are not performed in the acute setting. Review of the MIP images confirms the above findings IMPRESSION: 1. Proximal posterior division left M2 occlusion. 2. More distal anterior division left M2/M3 occlusion. 3. Extensive intracranial small vessel disease. 4. Occluded left vertebral artery with minimal reconstitution in the neck. 5. Fetal type posterior cerebral arteries bilaterally. 6. Atherosclerotic changes at the carotid bifurcations and cavernous internal carotid arteries bilaterally without other significant stenosis. 7. Aortic Atherosclerosis (ICD10-I70.0). Calcifications involve the great vessel origins without significant stenoses. 8. Large bilateral pleural effusions, right greater than left. 9. Parenchymal volume loss in the right upper lobe likely related to the effusions. Central mass lesion not excluded. 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 Recommend dedicated CT the chest with contrast as the patient's condition allows. 10. These results were called by  telephone at the time of interpretation on 12/03/2017 at 11:02 am to Dr. Roland Rack , who verbally acknowledged these results. Electronically Signed   By: San Morelle M.D.   On: 12/03/2017 11:05   Ct Chest Wo Contrast  Addendum Date: 12/04/2017   ADDENDUM REPORT: 12/04/2017 17:04 ADDENDUM: The upper abdominal section of the original report was inadvertently not included and is dictated below. UPPER ABDOMEN: Liver margin appears irregular. Decreased attenuation in the dome is likely due to dilated hepatic veins. Stones are seen in the gallbladder. Probable adrenal thickening. Visualized portions of the kidneys, spleen, pancreas and stomach are grossly unremarkable. Upper abdominal lymph nodes do not appear enlarged by CT size criteria. Ascites. IMPRESSION: 8. Marginal irregularity of the liver is indicative of cirrhosis. 9.       Cholelithiasis. 10.     Ascites. Electronically Signed   By: Lorin Picket M.D.   On: 12/04/2017 17:04   Result Date: 12/04/2017 CLINICAL DATA:  Pleural effusions. EXAM: CT CHEST WITHOUT CONTRAST TECHNIQUE: Multidetector CT imaging of the chest was performed following the standard protocol without IV contrast. COMPARISON:  Chest radiograph 12/03/2017 and CT chest 01/01/2017. FINDINGS: Cardiovascular: Right IJ central line terminates in the SVC. Atherosclerotic calcification of the arterial vasculature, including coronary arteries. Pulmonary arteries are enlarged. Heart is moderately enlarged with a moderate pericardial effusion. Mediastinum/Nodes: Low internal jugular and mediastinal adenopathy measures up to 1.1 cm in the right paratracheal station.  There are calcified mediastinal and bihilar lymph nodes. Distal periesophageal lymph node measures 11 mm, as before. No axillary adenopathy. Lungs/Pleura: Image quality is degraded by respiratory motion. Large right pleural effusion and adjacent collapse/consolidation obscure the majority of the right hemithorax. Nodular  areas in the apex of the left upper lobe are grossly stable. Scattered areas of peribronchovascular ground-glass in the left upper lobe, possibly infectious or inflammatory in etiology. Moderate left pleural effusion with collapse/consolidation in the left lower lobe. Endotracheal tube terminates at the orifice of the left mainstem bronchus. Musculoskeletal: Degenerative changes in the spine. No worrisome lytic or sclerotic lesions. IMPRESSION: 1. Endotracheal tube is low lying, at the orifice of the left mainstem bronchus. Pulling back 3-4 cm would likely better position the tip above the carina. 2. Large right pleural effusion with collapse/consolidation in the adjacent right lung. 3. Moderate left pleural effusion with collapse/consolidation in the left lower lobe. 4. Scattered areas of nodularity and ground-glass in the left upper lobe, poorly evaluated due to respiratory motion. 5. Moderate pericardial effusion. 6. Aortic atherosclerosis (ICD10-170.0). Coronary artery calcification. 7. Enlarged pulmonary arteries, indicative of pulmonary arterial hypertension. Electronically Signed: By: Lorin Picket M.D. On: 12/04/2017 16:47   [facility]  Result Date: 12/06/2017 INDICATION: Global aphasia.  Right-sided hemiplegia, left gaze deviation. EXAM: 1. EMERGENT LARGE VESSEL OCCLUSION THROMBOLYSIS (anterior CIRCULATION) COMPARISON:  CT angiogram of the head and neck of 12/03/2017. MEDICATIONS: Ancef 2 g IV antibiotic was administered within 1 hour of the procedure. ANESTHESIA/SEDATION: General anesthesia. CONTRAST:  Isovue 300 approximately 90 cc. FLUOROSCOPY TIME:  Fluoroscopy Time: 40 minutes 12 seconds (2313 mGy). COMPLICATIONS: None immediate. TECHNIQUE: Emergency consent was obtained by 2 physicians involved in the care of this patient. No family members were available or could be reached on the telephone numbers provided. The patient was then put under general anesthesia by the Department of  Anesthesiology at [facility]. The right groin was prepped and draped in the usual sterile fashion. Thereafter using modified Seldinger technique, transfemoral access into the right common femoral artery was obtained without difficulty. Over a 0.035 inch guidewire a 5 French Pinnacle sheath was inserted. Through this, and also over a 0.035 inch guidewire a 5 [place] JB 1 catheter was advanced to the aortic arch region and selectively positioned in the left common carotid artery. FINDINGS: The left common carotid arteriogram demonstrates the left external carotid artery and its major branches to be widely patent. The left internal carotid artery at the bulb to the cranial skull base also demonstrates wide patency with moderate tortuosity involving the distal 1/3. No evidence of stenosis or of kinking was seen. The petrous, cavernous and supraclinoid segments demonstrate wide patency. A dominant left posterior communicating artery is seen opacifying the left posterior cerebral artery distribution. The left anterior cerebral artery is seen to opacify into the capillary and venous phases. 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 The delayed arterial phase demonstrates partial retrograde opacification of the distal distribution of this occluded vessel from the collaterals arising from the inferior division, and also the left pericallosal artery. PROCEDURE: The diagnostic JB 1 catheter in the left common carotid artery was then exchanged over a 0.035 inch 300 cm Rosen exchange guidewire for an 8 French 55 cm Brite tip neurovascular sheath using biplane roadmap technique and constant  fluoroscopic guidance. Good aspiration obtained from the side port of the neurovascular sheath. This was then connected to continuous  heparinized saline infusion. Over the 300 cm Rosen exchange guidewire, an 8 [place] 85 cm FlowGate balloon guide catheter which had been prepped with 50% contrast and 50% heparinized saline infusion was then positioned just proximal to the left common carotid bifurcation. The guidewire was removed. Good aspiration obtained from the hub of the 8 [place] FlowGate guide catheter. A gentle contrast injection demonstrated no evidence of spasms, dissections or of intraluminal filling defects. Over a 0.035 inch Roadrunner guidewire, using biplane roadmap technique and constant fluoroscopic guidance, this was then advanced to the middle 1/3 of the left internal carotid artery. Again the guidewire was removed. Good aspiration obtained from the hub of the [facility] guide catheter. A gentle contrast injection demonstrated no evidence of spasms, dissections or of intraluminal filling defects. No change was seen in the intracranial circulation. At this time, in a coaxial manner and with constant heparinized saline infusion using biplane roadmap technique and constant fluoroscopic guidance, a Trevo ProVue 021 microcatheter was then advanced over a 0.014 inch Softip Synchro micro guidewire to the distal end of the FlowGate guide catheter. With the micro guidewire leading with a J-tip configuration, the combination was then navigated without difficulty to the supraclinoid left ICA. The wire was then advanced using a torque device into the left middle cerebral artery and super selectively advanced through the occluded M2 branch of the superior division M2 M3 region followed by the microcatheter. The guidewire was removed. Good aspiration obtained from the hub of the microcatheter. A gentle contrast injection demonstrated robust distal flow. A EmboTrap 5 mm x 33 mm retrieval device was then advanced in a coaxial manner with constant heparinized saline infusion using biplane roadmap technique and constant fluoroscopic guidance to the  distal end of the microcatheter. The proximal and the distal landing zones were then defined. The O ring on the delivery microcatheter was then loosened. With slight forward gentle traction with the right hand on the delivery micro guidewire, with the left hand the delivery microcatheter was then retrieved deploying the retrieval device. Control arteriogram performed through the 8 [place] FlowGate guide catheter in the left internal carotid artery demonstrated a TICI 2b revascularization. The proximal portion of the retrieval device was then captured in the microcatheter. Proximal flow arrest was then initiated by inflating the balloon of the [facility] guide catheter in the left internal carotid artery. Thereafter using constant aspiration with a 60 mL syringe at the hub of the [facility] guide catheter, the combination of the retrieval device in the microcatheter were then gently retrieved and removed. Aspiration was continued as the balloon was then deflated. Brisk back bleed of blood was noted at the hub of the [facility] guide catheter. A control arteriogram performed through the FlowGate 8 [place] guide catheter demonstrated no significant change in the occluded M2 superior division. This prompted a second pass using the above combination as described above. Again access was obtained distal to the occluded artery in the M2 superior division followed by the placement of the microcatheter. After having verified safe position of tip of the microcatheter, the EmboTrap 5 mm x 33 mm retrieval device was then deployed as described above. A control arteriogram performed after deployment revealed continued occlusion of the superior division M2 branch. However, with proximal flow arrest as described above, the combination of the retrieval device and the microcatheter were then retrieved. Aspiration with a 60 mL syringe  at the hub of the [facility] guide catheter was continued as the balloon was then deflated. The aspirate did not  contain any clots. The interstices of the retrieval device were also devoid of any clot debris. A third pass was then made at this time with the aid of an intermediate 5 French 115 cm Catalyst guide catheter inside of which was an [employer] microcatheter. This combination was then advanced over a 0.014 inch Softip Synchro micro guidewire to the supraclinoid left ICA. Access into the occluded M2 superior division of the right middle cerebral artery was then made with the micro guidewire followed by the microcatheter. The micro guidewire was then removed. Good aspiration was obtained at the hub of the microcatheter. Gentle contrast injection demonstrated good antegrade distal flow. The proximal and the distal landing zone of the retrieval device was then again defined. The O ring on the delivery microcatheter was loosened. With slight gentle traction with the right hand on the delivery micro guidewire, with the left hand the delivery microcatheter was retrieved unsheathing the retrieval device. A control arteriogram performed through the 5 French intermediary Catalyst guide catheter in the supraclinoid left ICA demonstrated a TICI 3 revascularization. The combination of the 5 [place] Catalyst guide catheter with the microcatheter and the retrieval device were then retrieved and removed as constant aspiration was applied with a 60 mL syringe at the hub of the [facility] guide catheter in the left internal carotid artery, and also with a 25 mL syringe on the 5 [place] Catalyst guide catheter. The tip of the Catalyst guide catheter was now at the proximal portion of the retrieval device. The combination was then retrieved and removed as constant aspiration was continued as flow arrest was reversed in the left internal carotid artery. A few specks of clot were seen in the aspirate. The retrieval device was devoid of any clots. After having established free back bleed at the hub of the [facility] guide catheter in the  left internal carotid artery, a control arteriogram performed through the guide catheter demonstrated a TICI 3 reperfusion. No evidence of extravasation or of mass-effect on the major vessel was seen. The left posterior communicating artery and the left anterior cerebral artery continued to demonstrate wide patency in their entirety. The FlowGate guide catheter and the 8 French neurovascular sheath were then removed over a J-tip guidewire for an 8 [place] Pinnacle sheath. This was then removed with successful hemostasis in the right groin puncture site. The right groin appeared soft without evidence of bleeding or hematoma. Throughout the procedure, the patient's blood pressure and neurological status remained stable. The patient's distal pulses remained Dopplerable in the posterior tibials and dorsalis pedis unchanged from prior to the procedure. A Dyna CT of the brain performed on the table demonstrated no gross evidence of intracranial hemorrhages. The ventricles appeared symmetrical and unchanged. The patient was then transported to the PACU intubated prior to being sent to the neuro ICU. IMPRESSION: Status post endovascular complete revascularization of occluded prominent superior division of the left middle cerebral artery using 3 passes with the EmboTrap 5 mm x 33 mm retrieval device achieving a TICI 3 revascularization. PLAN: Transferred to the PACU and neuro ICU for further management. Electronically Signed   By: Luanne Bras M.D.   On: 12/05/2017 09:28   Dg Chest Port 1 View  Result Date: 12/06/2017 CLINICAL DATA:  Respiratory failure, history of stroke EXAM: PORTABLE CHEST 1 VIEW COMPARISON:  Portable chest x-ray of 12/04/2017 FINDINGS: The  tip of the endotracheal tube is approximately 3.5 cm above the carina. There is little change in moderate cardiomegaly with effusions right larger than left and resulting basilar atelectasis. Mild pulmonary vascular congestion cannot be excluded. Permanent  pacemaker remains. Right IJ central venous line tip overlies the mid lower SVC. IMPRESSION: 1. Tip of endotracheal tube 3.5 cm above the carina. 2. Little change in bilateral effusions right much larger than left with basilar volume loss and cardiomegaly. Electronically Signed   By: Ivar Drape M.D.   On: 12/06/2017 09:45   Dg Chest Port 1 View  Result Date: 12/04/2017 CLINICAL DATA:  Central line placement EXAM: PORTABLE CHEST 1 VIEW COMPARISON:  Chest radiograph from earlier today. 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 Endotracheal tube tip is 2.4 cm above the carina. Right internal jugular central venous catheter terminates in the middle third of the SVC. Stable cardiomediastinal silhouette with mild cardiomegaly. No pneumothorax. Moderate right pleural effusion, decreased. Stable small left pleural effusion. Improved aeration in the right lung with persistent right greater than left bibasilar lung opacities, stable on the left and decreased on the right. No pulmonary edema. IMPRESSION: 1. Right internal jugular central venous catheter terminates in the middle third of the SVC. Well-positioned endotracheal tube. 2. Moderate right pleural effusion, decreased. Improved aeration in the right lung. 3. Stable small left pleural effusion. 4. Right greater than left bibasilar lung opacities, decreased on the right and stable on the left. Electronically Signed   By: Ilona Sorrel M.D.   On: 12/04/2017 11:04   Dg Chest Port 1 View  Result Date: 12/04/2017 CLINICAL DATA:  Respiratory failure EXAM: PORTABLE CHEST 1 VIEW COMPARISON:  Chest radiograph from one day prior. FINDINGS: Right rotated chest radiograph. Endotracheal tube tip is 1.9 cm above the carina. 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 Stable cardiomediastinal silhouette with mild cardiomegaly. No pneumothorax. Worsening essentially complete opacification of the right hemithorax. Small stable left pleural  effusion. No overt pulmonary edema in the aerated left lung. IMPRESSION: 1. Endotracheal tube tip 1.9 cm above the carina. 2. Worsening essentially complete opacification of the right hemithorax, which could represent any combination of right pleural effusion, right lung atelectasis, pneumonia or mass. 3. Stable small left pleural effusion. Electronically Signed   By: Ilona Sorrel M.D.   On: 12/04/2017 08:41   Dg Chest Portable 1 View  Result Date: 12/03/2017 CLINICAL DATA:  Respiratory failure and status post intubation. Status post left thoracentesis on 11/29/2017 and right thoracentesis on 11/28/2017. EXAM: PORTABLE CHEST 1 VIEW COMPARISON:  11/29/2017 and 11/25/2017 chest x-rays at [facility] FINDINGS: Stable cardiac enlargement and appearance of dual-chamber pacemaker. Endotracheal tube present with the tip approximately 2 cm above the carina. Since the prior chest x-ray, there is significant volume loss of the right lung with likely component recurrent pleural fluid as well as consolidation/atelectasis. The left lung shows no evidence of airspace disease, edema or pleural fluid. No pneumothorax identified. IMPRESSION: 1. Endotracheal tube tip approximately 2 cm above the carina. 2. Significant volume loss of right lung since prior chest x-ray with recurrent right pleural effusion as well as underlying consolidation/atelectasis of the right lung. Electronically Signed   By: Aletta Edouard M.D.   On: 12/03/2017 09:55   Dg Abd Portable 1v  Result Date: 12/05/2017 CLINICAL DATA:  Status post nasogastric tube placement. The patient is intubated and has sustained a CVA. History of CHF. EXAM: PORTABLE ABDOMEN - 1 VIEW COMPARISON:  Chest  x-ray of Dec 04, 2017 FINDINGS: There is volume loss on the right. There is increased density in the retrocardiac region on the left. There is dense calcification in the mitral valvular annulus. The gas pattern in the upper abdomen is unremarkable. The esophagogastric  tube tip in proximal port project in the region of the gastric body. The endotracheal tube tip lies approximately 3 cm above the carina. The right internal jugular venous catheter tip projects over the midportion of the SVC. The ICD is in stable position. The heart is top-normal in size. The pulmonary vascularity is normal. There is calcification in the wall of the aortic arch. IMPRESSION: Improved aeration of the right lung. Persistent right pleural effusion and right basilar atelectasis or pneumonia. Persistent left lower lobe atelectasis or pneumonia. No pulmonary edema. The support tubes including the newly placed esophagogastric tube are in reasonable position. Thoracic aortic atherosclerosis. Electronically Signed   By: David  Martinique M.D.   On: 12/05/2017 11:21   Ir Percutaneous Art Thrombectomy/infusion Intracranial Inc Diag Angio  Result Date: 12/06/2017 INDICATION: Global aphasia.  Right-sided hemiplegia, left gaze deviation. EXAM: 1. EMERGENT LARGE VESSEL OCCLUSION THROMBOLYSIS (anterior CIRCULATION) COMPARISON:  CT angiogram of the head and neck of 12/03/2017. MEDICATIONS: Ancef 2 g IV antibiotic was administered within 1 hour of the procedure. ANESTHESIA/SEDATION: General anesthesia. CONTRAST:  Isovue 300 approximately 90 cc. FLUOROSCOPY TIME:  Fluoroscopy Time: 40 minutes 12 seconds (2313 mGy). COMPLICATIONS: None immediate. TECHNIQUE: Emergency consent was obtained by 2 physicians involved in the care of this patient. No family members were available or could be reached on the telephone numbers provided. The patient was then put under general anesthesia by the Department of Anesthesiology at [facility]. The right groin was prepped and draped in the usual sterile fashion. Thereafter using modified Seldinger technique, transfemoral access into the right common femoral artery was obtained without difficulty. Over a 0.035 inch guidewire a 5 [place] Pinnacle sheath was inserted. Through this, and  also over a 0.035 inch guidewire a 5 [place] JB 1 catheter was advanced to the aortic arch region and selectively positioned in the left common carotid artery. FINDINGS: The left common carotid arteriogram demonstrates the left external carotid artery and its major branches to be widely patent. The left internal carotid artery at the bulb to the cranial skull base also demonstrates wide patency with moderate tortuosity involving the distal 1/3. No evidence of stenosis or of kinking was seen. The petrous, cavernous and supraclinoid segments demonstrate wide patency. A dominant left posterior communicating artery is seen opacifying the left posterior cerebral artery distribution. The left anterior cerebral artery is seen to opacify into the capillary and venous phases. 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 The delayed arterial phase demonstrates partial retrograde opacification of the distal distribution of this occluded vessel from the collaterals arising from the inferior division, and also the left pericallosal artery. PROCEDURE: The diagnostic JB 1 catheter in the left common carotid artery was then exchanged over a 0.035 inch 300 cm Rosen exchange guidewire for an 8 French 55 cm Brite tip neurovascular sheath using biplane roadmap technique and constant fluoroscopic guidance. Good aspiration obtained from the side port of the neurovascular sheath. This was then connected to continuous heparinized saline infusion. Over the 300 cm Rosen exchange guidewire, an 8 [place] 85 cm FlowGate  balloon guide catheter which had been prepped with 50% contrast and 50% heparinized saline infusion was then positioned just proximal to the left common carotid bifurcation. The guidewire was removed. Good aspiration obtained from the  hub of the 8 [place] FlowGate guide catheter. A gentle contrast injection demonstrated no evidence of spasms, dissections or of intraluminal filling defects. Over a 0.035 inch Roadrunner guidewire, using biplane roadmap technique and constant fluoroscopic guidance, this was then advanced to the middle 1/3 of the left internal carotid artery. Again the guidewire was removed. Good aspiration obtained from the hub of the [facility] guide catheter. A gentle contrast injection demonstrated no evidence of spasms, dissections or of intraluminal filling defects. No change was seen in the intracranial circulation. At this time, in a coaxial manner and with constant heparinized saline infusion using biplane roadmap technique and constant fluoroscopic guidance, a Trevo ProVue 021 microcatheter was then advanced over a 0.014 inch Softip Synchro micro guidewire to the distal end of the FlowGate guide catheter. With the micro guidewire leading with a J-tip configuration, the combination was then navigated without difficulty to the supraclinoid left ICA. The wire was then advanced using a torque device into the left middle cerebral artery and super selectively advanced through the occluded M2 branch of the superior division M2 M3 region followed by the microcatheter. The guidewire was removed. Good aspiration obtained from the hub of the microcatheter. A gentle contrast injection demonstrated robust distal flow. A EmboTrap 5 mm x 33 mm retrieval device was then advanced in a coaxial manner with constant heparinized saline infusion using biplane roadmap technique and constant fluoroscopic guidance to the distal end of the microcatheter. The proximal and the distal landing zones were then defined. The O ring on the delivery microcatheter was then loosened. With slight forward gentle traction with the right hand on the delivery micro guidewire, with the left hand the delivery microcatheter was then retrieved deploying the retrieval  device. Control arteriogram performed through the 8 [place] FlowGate guide catheter in the left internal carotid artery demonstrated a TICI 2b revascularization. The proximal portion of the retrieval device was then captured in the microcatheter. Proximal flow arrest was then initiated by inflating the balloon of the [facility] guide catheter in the left internal carotid artery. Thereafter using constant aspiration with a 60 mL syringe at the hub of the [facility] guide catheter, the combination of the retrieval device in the microcatheter were then gently retrieved and removed. Aspiration was continued as the balloon was then deflated. Brisk back bleed of blood was noted at the hub of the [facility] guide catheter. A control arteriogram performed through the FlowGate 8 [place] guide catheter demonstrated no significant change in the occluded M2 superior division. This prompted a second pass using the above combination as described above. Again access was obtained distal to the occluded artery in the M2 superior division followed by the placement of the microcatheter. After having verified safe position of tip of the microcatheter, the EmboTrap 5 mm x 33 mm retrieval device was then deployed as described above. A control arteriogram performed after deployment revealed continued occlusion of the superior division M2 branch. However, with proximal flow arrest as described above, the combination of the retrieval device and the microcatheter were then retrieved. Aspiration with a 60 mL syringe at the hub of the [facility] guide catheter was continued as the balloon was then deflated. The aspirate did not contain any clots. The interstices of the retrieval device were also devoid of any clot  debris. A third pass was then made at this time with the aid of an intermediate 5 [place] 115 cm Catalyst guide catheter inside of which was an [employer] microcatheter. This combination was then advanced over a 0.014 inch Softip Synchro  micro guidewire to the supraclinoid left ICA. Access into the occluded M2 superior division of the right middle cerebral artery was then made with the micro guidewire followed by the microcatheter. The micro guidewire was then removed. Good aspiration was obtained at the hub of the microcatheter. Gentle contrast injection demonstrated good antegrade distal flow. The proximal and the distal landing zone of the retrieval device was then again defined. The O ring on the delivery microcatheter was loosened. With slight gentle traction with the right hand on the delivery micro guidewire, with the left hand the delivery microcatheter was retrieved unsheathing the retrieval device. A control arteriogram performed through the 5 [place] intermediary Catalyst guide catheter in the supraclinoid left ICA demonstrated a TICI 3 revascularization. The combination of the 5 [place] Catalyst guide catheter with the microcatheter and the retrieval device were then retrieved and removed as constant aspiration was applied with a 60 mL syringe at the hub of the [facility] guide catheter in the left internal carotid artery, and also with a 25 mL syringe on the 5 [place] Catalyst guide catheter. The tip of the Catalyst guide catheter was now at the proximal portion of the retrieval device. The combination was then retrieved and removed as constant aspiration was continued as flow arrest was reversed in the left internal carotid artery. A few specks of clot were seen in the aspirate. The retrieval device was devoid of any clots. After having established free back bleed at the hub of the [facility] guide catheter in the left internal carotid artery, a control arteriogram performed through the guide catheter demonstrated a TICI 3 reperfusion. No evidence of extravasation or of mass-effect on the major vessel was seen. The left posterior communicating artery and the left anterior cerebral artery continued to demonstrate wide patency in their  entirety. The FlowGate guide catheter and the 8 French neurovascular sheath were then removed over a J-tip guidewire for an 8 [place] Pinnacle sheath. This was then removed with successful hemostasis in the right groin puncture site. The right groin appeared soft without evidence of bleeding or hematoma. Throughout the procedure, the patient's blood pressure and neurological status remained stable. The patient's distal pulses remained Dopplerable in the posterior tibials and dorsalis pedis unchanged from prior to the procedure. A Dyna CT of the brain performed on the table demonstrated no gross evidence of intracranial hemorrhages. The ventricles appeared symmetrical and unchanged. The patient was then transported to the PACU intubated prior to being sent to the neuro ICU. IMPRESSION: Status post endovascular complete revascularization of occluded prominent superior division of the left middle cerebral artery using 3 passes with the EmboTrap 5 mm x 33 mm retrieval device achieving a TICI 3 revascularization. PLAN: Transferred to the PACU and neuro ICU for further management. Electronically Signed   By: Luanne Bras M.D.   On: 12/05/2017 09:28   Ct Head Code Stroke Wo Contrast  Result Date: 12/03/2017 CLINICAL DATA:  Code stroke. Acute onset of unresponsiveness while in the emergency department. Patient is known nonverbal. Acute onset of right-sided weakness. EXAM: CT HEAD WITHOUT CONTRAST TECHNIQUE: Contiguous axial images were obtained from the base of the skull through the vertex without intravenous contrast. COMPARISON:  None. FINDINGS: Brain: No acute infarct, hemorrhage, or  mass lesion is present. 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 Hypodense infarct is present in the high right parietal lobe, likely also remote. A remote infarct of the left frontal operculum is present. Associated volume loss is noted. No acute  left-sided infarct is present. No acute hemorrhage or mass lesion is present. Remote infarcts are present posteriorly within the left cerebellum. Brainstem is unremarkable. No significant extra-axial fluid collection is present. No significant extra-axial fluid collection is present. Vascular: Atherosclerotic calcifications are present within the internal carotid arteries bilaterally. There is no hyperdense vessel. Skull: Calvarium is intact. No focal lytic or blastic lesions are present. Sinuses/Orbits: The paranasal sinuses are clear. A remote right orbital blowout fracture is present. A right lens replacement is present. Globes and orbits are otherwise within normal limits. ASPECTS [facility] Stroke Program Early CT Score) - Ganglionic level infarction (caudate, lentiform nuclei, internal capsule, insula, M1-M3 cortex): 7/7 - Supraganglionic infarction (M4-M6 cortex): 3/3. Total score (0-10 with 10 being normal): 10/10. IMPRESSION: 1. Infarcts involving the high right parietal lobe and left frontal operculum appear remote. 2. Remote lacunar infarct of the left caudate head. 3. Remote infarcts of the posterior left cerebellum. 4. No acute infarct. 5. 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 6. ASPECTS is 10/10 These results were called by telephone at the time of interpretation on 12/03/2017 at 9:03 am to Dr. Blanchie Dessert , who verbally acknowledged these results. Electronically Signed   By: San Morelle M.D.   On: 12/03/2017 09:08    TTE - Left ventricle: The cavity size was below normal. Wall thickness   was increased in a pattern of mild LVH. Systolic function was   vigorous. The estimated ejection fraction was in the range of 65%   to 70%. Wall motion was normal; there were no regional wall   motion abnormalities. Doppler parameters are consistent with   abnormal left ventricular relaxation (grade 1 diastolic   dysfunction). Doppler  parameters are consistent with high   ventricular filling pressure. - Ventricular septum: The contour showed diastolic flattening and   systolic flattening. These changes are consistent with RV volume   and pressure overload. - Aortic valve: Mildly calcified annulus. Trileaflet. - Mitral valve: Severely calcified annulus. Severely calcified   leaflets . The findings are consistent with mild stenosis. Mean   gradient (D): 4 mm Hg. Valve area by pressure half-time: 2.12   cm^2. Valve area by continuity equation (using LVOT flow): 0.81   cm^2. - Right ventricle: The cavity size was severely dilated. Pacer wire   or catheter noted in right ventricle. Systolic function was   reduced. - Right atrium: The atrium was severely dilated. Pacer wire or   catheter noted in right atrium. - Tricuspid valve: There was severe regurgitation. - Pulmonic valve: There was mild regurgitation. - Pulmonary arteries: Systolic pressure was severely increased. PA   peak pressure: 71 mm Hg (S). - Inferior vena cava: The CVP could not accurately be assessed as   the patient was ventilated (via IVC assessment). - Pericardium, extracardiac: A moderate to large size pericardial   effusion was noted. Features were not consistent with tamponade   physiology.   PHYSICAL EXAM  Temp:  [97.7 F (36.5 C)-98.7 F (37.1 C)] 97.8 F (36.6 C) (05/07 0800) Pulse Rate:  [80-99] 96 (05/07 0715) Resp:  [15-19] 16 (05/07 0715) BP: (51-184)/(24-166) 111/55 (05/07 0700) SpO2:  [  100 %] 100 % (05/07 0715) Arterial Line BP: (88-134)/(44-62) 115/56 (05/07 0715) FiO2 (%):  [40 %] 40 % (05/07 0740) Weight:  [181 lb 7 oz (82.3 kg)] 181 lb 7 oz (82.3 kg) (05/07 0500)  General - Well nourished, well developed, in no acute distress  Ophthalmologic - fundi not visualized due to noncooperation.  Cardiovascular - Regular rate and rhythm, not in afib.  Neuro - awake, alert, orientated to self and place, but not to time or age,  significant perseveration, able to follow simple commands, limited spontaneous speech, no dysarthria. PERRL, EOMI but incomplete gaze bilaterally, blinking to visual threat bilaterally. Facial symmetric and tongue mid position. Moving all extremities equally and at least 4+/5. DTR 1+ and no babinski. Sensation, coordination not cooperative and gait not tested.   ASSESSMENT/PLAN Ms. Ana Gardner is a 77 y.o. 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 No tPA given due to recent GI bleeding.  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 Contrast  Repeat CT left inferior insular small ICH  2D Echo EF 65-70%, severely dilated RA, moderate to large pericardial effusion  LDL 38  HgbA1c 8.6  SCDs for VTE prophylaxis  NPO for now   No antithrombotic prior to admission, now on No antithrombotic due to small ICH. If neuro and CT stable, will consider to start ASA 81 tomorrow.   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   Right heart CHF  TTE EF 65-70%, severely dilated RA, moderate to large pericardial effusion  CCM on board  CT showed large pleural effusion  Self extubated and tolerating well so far  Consider cardiology  consult if felt appropriate  Alcoholic liver cirrhosis  AST/ALT WNL  INR high in 10/2017  INR 1.85->1.76->1.53  EGD showed severe PHG   Recent GIB in 10/2017 due to severe PHG - GI following as outpt  No antithrombotics PTA  CKD, improving  Cre 2.04->1.98->1.43->1.32  Gentle hydration  I/O monitoring  Anemia   Hb 10.1->9.1->8.3  Close monitoring  Likely due to recent GIB, IR procedure, and PHG  Consider PRBC if needed  Diabetes  HgbA1c 8.6 goal < 7.0  Uncontrolled  CBG monitoring - glucose stable  SSI  DM education and close PCP follow up  Hypotension, stable on levophed  BP stable  Still on levophed  BP goal 110-140   Other Stroke Risk Factors  Advanced age  ETOH use  Other Active Problems  Pacemaker present  Left breast cancer in situ on tamoxifen   Hospital day #  3  This patient is critically ill due to left MCA infarct, severe right-sided CHF s/p pacer, bilateral pleural effusion, atrial fibrillation off Eliquis due to recent GI bleeding and portal hypertensive gastropathy, 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 This patient's care requires constant monitoring of vital signs, hemodynamics, respiratory and cardiac monitoring, review of multiple databases, neurological assessment, discussion with family, other specialists and medical decision making of high complexity. I spent 35 minutes of neurocritical care time in the care of this patient. I discussed with Dr. Pati Gallo at bedside.  Rosalin Hawking, MD PhD Stroke Neurology 12/06/2017 10:34 AM    To contact Stroke Continuity provider, please refer to [email or web address] After hours, contact General Neurology

## 2017-12-06 NOTE — Procedures (Signed)
Extubation Procedure Note  Patient Details:   Name: Ana Gardner DOB: Jan 03, 1941 MRN: [id number]   Airway Documentation:    Vent end date: 12/06/17 Vent end time: 0825   Evaluation    O2 sats: stable throughout Complications: No apparent complications Patient did not tolerate procedure well. Bilateral Breath Sounds: Rhonchi   Yes Yes   RT called to bedside, pt self extubated to RA, pt stable NARD VSS. CCM MD and RN at bedside, No stridor noted, placed on 2L [place], pt able to speak. Will cont to monitor.  Lottie Rater 12/06/2017, 8:52 AM

## 2017-12-06 NOTE — Evaluation (Signed)
Clinical/Bedside Swallow Evaluation Patient Details  Name: Ana Gardner MRN: [id number] Date of Birth: 08/14/1940  Today's Date: 12/06/2017 Time: SLP Start Time (ACUTE ONLY): 1500 SLP Stop Time (ACUTE ONLY): 1515 SLP Time Calculation (min) (ACUTE ONLY): 15 min  Past Medical History:  Past Medical History:  Diagnosis Date  . Cancer (HCC)    B/L  . CHF (congestive heart failure) ([place])   . Diabetes mellitus without complication ([place])   . Sarcoidosis    Past Surgical History:  Past Surgical History:  Procedure Laterality Date  . IR CT HEAD LTD  12/03/2017  . IR PERCUTANEOUS ART THROMBECTOMY/INFUSION INTRACRANIAL INC DIAG ANGIO  12/03/2017  . MASTECTOMY Left 2004  . PACEMAKER IMPLANT    . RADIOLOGY WITH ANESTHESIA N/A 12/03/2017   Procedure: CODE STROKE;  Surgeon: Luanne Bras, MD;  Location: [facility];  Service: Radiology;  Laterality: N/A;   HPI:  Ms. Ana Gardner is a 77 y.o. 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 No tPA given due to recent GI bleeding.  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 No MRI due to pacer. Intubated from 5/4-5/7 when she self extubated.    Assessment / Plan / Recommendation Clinical Impression  Pt demonstrates normal swallow function with  no signs of aspiration observed. Pt able to resume a regular diet and will f/u tomorrow for assessment of language function.  SLP Visit Diagnosis: Dysphagia, unspecified (R13.10)    Aspiration Risk  Mild aspiration risk    Diet Recommendation Regular;Thin liquid   Liquid Administration via: Cup;Straw Medication Administration: Whole meds with liquid Supervision: Staff to  assist with self feeding Compensations: Slow rate;Small sips/bites Postural Changes: Seated upright at 90 degrees    Other  Recommendations     Follow up Recommendations        Frequency and Duration            Prognosis        Swallow Study   General HPI: Ms. Ana Gardner is a 77 y.o. 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 No tPA given due to recent GI bleeding.  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 No MRI due to pacer. Intubated from 5/4-5/7 when she self extubated.  Type of Study: Bedside Swallow Evaluation Previous Swallow Assessment: none Diet Prior to this Study: NPO Temperature Spikes Noted: No Respiratory Status: Room air History of Recent Intubation: Yes Length of Intubations (days): 4 days Behavior/Cognition: Alert;Cooperative;Pleasant mood;Requires cueing Oral Cavity Assessment: Dried secretions Oral Care Completed by SLP: Yes Oral Cavity - Dentition: Poor condition;Edentulous;Dentures, top Vision: Functional for self-feeding Self-Feeding Abilities: Able to feed self Patient Positioning: Upright in bed Baseline Vocal Quality: Hoarse Volitional Cough: Strong Volitional Swallow: Able to elicit    Oral/Motor/Sensory Function Overall Oral Motor/Sensory Function: Within functional limits   Ice Chips     Thin Liquid Thin Liquid: Within functional limits Presentation: Cup;Straw    Nectar Thick Nectar Thick Liquid: Not tested  Honey Thick Honey Thick Liquid: Not tested   Puree Puree: Within functional limits Presentation: Spoon   Solid   GO   Solid: Within functional limits       [facility], MA CCC-SLP  [phone]  Lynann Beaver 12/06/2017,3:43 PM

## 2017-12-06 NOTE — Progress Notes (Signed)
Notified that patient had self extubated while I was in another patient's room. Upon assessment patient was found to be sitting up right with 2L of nasal canula with her SP02 100%. Dr Pati Gallo and Janett Billow, RT was at bedside with patient. Patient was alert and was able to cough on command. She was also able to talk and tell me her name. All other VS's were WNL's. Will continue to monitor patient.

## 2017-12-06 NOTE — Progress Notes (Addendum)
Inpatient Diabetes Program Recommendations  AACE/ADA: New Consensus Statement on Inpatient Glycemic Control (2015)  Target Ranges:  Prepandial:   less than 140 mg/dL      Peak postprandial:   less than 180 mg/dL (1-2 hours)      Critically ill patients:  140 - 180 mg/dL   Lab Results  Component Value Date   GLUCAP 120 (H) 12/03/2017   HGBA1C 8.6 (H) 12/04/2017   Review of Glycemic Control  Diabetes history: DM 2 Outpatient Diabetes medications: None Current orders for Inpatient glycemic control: None  Inpatient Diabetes Program Recommendations:    Patient has a history of DM. Lab glucose 225 mg/dl this am. Consider Glycemic control order set Novolog Sensitive Correction 0-9 units Q4 hours. Patient also on Tube feeds currently.  Thanks,  Tama Headings RN, MSN, BC-ADM, [facility] Inpatient Diabetes Coordinator Team Pager [phone] (8a-5p)

## 2017-12-06 NOTE — Progress Notes (Signed)
Notified Dr Pati Gallo that patient's CBG was 223. Dr Pati Gallo ordered for patient to be placed on a sliding scale. Will continue to monitor patient.

## 2017-12-06 NOTE — Progress Notes (Signed)
Rehab Admissions Coordinator Note:  Patient was screened by Cleatrice Burke for appropriateness for an Inpatient Acute Rehab Consult.  At this time, we are recommending Inpatient Rehab consult. Please place order for consult. Noted extubated.  Cleatrice Burke 12/06/2017, 7:51 PM  I can be reached at [phone].

## 2017-12-06 NOTE — Care Management Note (Signed)
Case Management Note  Patient Details  Name: Ana Gardner MRN: [id number] Date of Birth: March 31, 1941  Subjective/Objective:                 Patient from home alone. 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  Daughter from [place] will be arriving Wednesday. Patient has ex pouse and friend Stanton Kidney in town.  Patient self extubated Tuesday, progressing.  Will follow for updated PT notes now that patient extubated to assess for DC needs.      Action/Plan:   Expected Discharge Date:                  Expected Discharge Plan:     In-House Referral:     Discharge planning Services  CM Consult  Post Acute Care Choice:    Choice offered to:     DME Arranged:    DME Agency:     HH Arranged:    HH Agency:     Status of Service:  In process, will continue to follow  If discussed at Long Length of Stay Meetings, dates discussed:    Additional Comments:  Carles Collet, RN 12/06/2017, 1:01 PM

## 2017-12-07 ENCOUNTER — Inpatient Hospital Stay (HOSPITAL_COMMUNITY): Payer: Medicare HMO

## 2017-12-07 DIAGNOSIS — D649 Anemia, unspecified: Secondary | ICD-10-CM

## 2017-12-07 LAB — BASIC METABOLIC PANEL
Anion gap: 7 (ref 5–15)
BUN: 25 mg/dL — AB (ref 6–20)
CO2: 26 mmol/L (ref 22–32)
CREATININE: 1.36 mg/dL — AB (ref 0.44–1.00)
Calcium: 8.7 mg/dL — ABNORMAL LOW (ref 8.9–10.3)
Chloride: 106 mmol/L (ref 101–111)
GFR calc Af Amer: 43 mL/min — ABNORMAL LOW (ref >60)
GFR, EST NON AFRICAN AMERICAN: 37 mL/min — AB (ref >60)
GLUCOSE: 169 mg/dL — AB (ref 65–99)
Potassium: 4.5 mmol/L (ref 3.5–5.1)
Sodium: 139 mmol/L (ref 135–145)

## 2017-12-07 LAB — CBC
HCT: 25.5 % — ABNORMAL LOW (ref 36.0–46.0)
Hemoglobin: 8 g/dL — ABNORMAL LOW (ref 12.0–15.0)
MCH: 21.9 pg — AB (ref 26.0–34.0)
MCHC: 31.4 g/dL (ref 30.0–36.0)
MCV: 69.7 fL — AB (ref 78.0–100.0)
PLATELETS: 306 10*3/uL (ref 150–400)
RBC: 3.66 MIL/uL — ABNORMAL LOW (ref 3.87–5.11)
RDW: 18.9 % — ABNORMAL HIGH (ref 11.5–15.5)
WBC: 10.1 10*3/uL (ref 4.0–10.5)

## 2017-12-07 LAB — GLUCOSE, CAPILLARY
GLUCOSE-CAPILLARY: 156 mg/dL — AB (ref 65–99)
GLUCOSE-CAPILLARY: 132 mg/dL — AB (ref 65–99)
Glucose-Capillary: 159 mg/dL — ABNORMAL HIGH (ref 65–99)

## 2017-12-07 LAB — MAGNESIUM: Magnesium: 1.9 mg/dL (ref 1.7–2.4)

## 2017-12-07 LAB — PHOSPHORUS: Phosphorus: 3.6 mg/dL (ref 2.5–4.6)

## 2017-12-07 LAB — PROTIME-INR
INR: 1.51
PROTHROMBIN TIME: 18.1 s — AB (ref 11.4–15.2)

## 2017-12-07 MED ORDER — POTASSIUM CHLORIDE CRYS ER 20 MEQ PO TBCR
20.0000 meq | EXTENDED_RELEASE_TABLET | Freq: Two times a day (BID) | ORAL | Status: DC
  Filled 2017-12-07: qty 1

## 2017-12-07 MED ORDER — ALUM & MAG HYDROXIDE-SIMETH 200-200-20 MG/5ML PO SUSP
30.0000 mL | ORAL | Status: DC | PRN
  Administered 2017-12-07: 30 mL via ORAL
  Filled 2017-12-07: qty 30

## 2017-12-07 MED ORDER — FUROSEMIDE 40 MG PO TABS
40.0000 mg | ORAL_TABLET | Freq: Two times a day (BID) | ORAL | Status: DC
  Administered 2017-12-07 – 2017-12-08 (×2): 40 mg via ORAL
  Filled 2017-12-07 (×2): qty 1

## 2017-12-07 MED ORDER — ASPIRIN EC 81 MG PO TBEC
81.0000 mg | DELAYED_RELEASE_TABLET | Freq: Every day | ORAL | Status: DC
  Administered 2017-12-07 – 2017-12-15 (×9): 81 mg via ORAL
  Filled 2017-12-07 (×10): qty 1

## 2017-12-07 MED ORDER — CHLORHEXIDINE GLUCONATE 0.12 % MT SOLN
OROMUCOSAL | Status: AC
  Administered 2017-12-07: 15 mL
  Filled 2017-12-07: qty 15

## 2017-12-07 MED ORDER — CALCIUM CARBONATE ANTACID 500 MG PO CHEW
1.0000 | CHEWABLE_TABLET | Freq: Once | ORAL | Status: AC
  Administered 2017-12-07: 200 mg via ORAL
  Filled 2017-12-07: qty 1

## 2017-12-07 MED ORDER — ORAL CARE MOUTH RINSE
15.0000 mL | Freq: Two times a day (BID) | OROMUCOSAL | Status: DC
  Administered 2017-12-07 – 2017-12-14 (×12): 15 mL via OROMUCOSAL

## 2017-12-07 NOTE — Progress Notes (Signed)
Upon assessment patient had pulled left IJ central line out. No respiratory distress noted. Appropriate dressing applied. CCM aware. No new orders at this time. Will continue to monitor. Lianne Bushy RN BSN.

## 2017-12-07 NOTE — Progress Notes (Signed)
SLP Cancellation Note  Patient Details Name: Ana Gardner MRN: [id number] DOB: 1941/01/30   Cancelled treatment:       Reason Eval/Treat Not Completed: Patient at procedure or test/unavailable   [name], [name] 12/07/2017, 3:46 PM

## 2017-12-07 NOTE — Progress Notes (Signed)
STROKE TEAM PROGRESS NOTE   SUBJECTIVE (INTERVAL HISTORY) Her RN and daughter are at the bedside.  Pt awake alert and no respiratory distress. Sitting in bed for breakfast. Speech improved but still has perseveration. As per daughter, pt lives alone PTA.  Repeat CT stable small hematoma, will start ASA today. INR still 1.51.    OBJECTIVE Temp:  [97.5 F (36.4 C)-98.4 F (36.9 C)] 97.5 F (36.4 C) (05/08 0800) Pulse Rate:  [52-95] 80 (05/08 1000) Cardiac Rhythm: Normal sinus rhythm (05/08 1000) Resp:  [15-27] 22 (05/08 0700) BP: (67-132)/(49-88) 112/64 (05/08 1000) SpO2:  [54 %-100 %] 95 % (05/08 1000) Arterial Line BP: (85-124)/(47-60) 102/52 (05/07 1700)  Recent Labs  Lab 12/03/17 1010 12/03/17 1327 12/06/17 1733 12/06/17 2127 12/07/17 0815  GLUCAP 113* 120* 223* 171* 159*   Recent Labs  Lab 12/04/17 0417 12/04/17 2046 12/05/17 0450 12/05/17 1854 12/06/17 0431 12/07/17 0334  NA 133* 133* 134*  --  136 139  K 4.0 3.4* 3.6  --  4.2 4.5  CL 99* 101 101  --  103 106  CO2 23 24 24   --  24 26  GLUCOSE 177* 174* 163*  --  225* 169*  BUN 39* 29* 26*  --  24* 25*  CREATININE 1.98* 1.55* 1.43*  --  1.32* 1.36*  CALCIUM 7.9* 8.1* 8.2*  --  8.4* 8.7*  MG  --   --  1.8 1.8 2.0 1.9  PHOS  --   --   --  3.5 3.7 3.6   Recent Labs  Lab 12/03/17 0909  AST 19  ALT 10*  ALKPHOS 48  BILITOT 1.0  PROT 5.8*  ALBUMIN 2.8*   Recent Labs  Lab 12/03/17 0909 12/03/17 1139 12/04/17 0417 12/06/17 0431 12/07/17 0334  WBC 7.1  --  19.0* 16.1* 10.1  NEUTROABS 4.5  --  15.9*  --   --   HGB 10.1* 11.2* 9.1* 8.3* 8.0*  HCT 28.6* 33.0* 28.1* 26.1* 25.5*  MCV 68.1*  --  69.7* 69.6* 69.7*  PLT 432*  --  408* 313 306   Recent Labs  Lab 12/03/17 0909  TROPONINI <0.03   Recent Labs    12/04/17 2046 12/05/17 0450 12/06/17 0431 12/07/17 0334  LABPROT 21.2* 20.3* 18.3* 18.1*  INR 1.85 1.76 1.53 1.51   No results for input(s): COLORURINE, LABSPEC, PHURINE, GLUCOSEU, HGBUR,  BILIRUBINUR, KETONESUR, PROTEINUR, UROBILINOGEN, NITRITE, LEUKOCYTESUR in the last 72 hours.  Invalid input(s): APPERANCEUR     Component Value Date/Time   CHOL 70 12/04/2017 0417   TRIG 72 12/04/2017 0417   HDL 18 (L) 12/04/2017 0417   CHOLHDL 3.9 12/04/2017 0417   VLDL 14 12/04/2017 0417   LDLCALC 38 12/04/2017 0417   Lab Results  Component Value Date   HGBA1C 8.6 (H) 12/04/2017      Component Value Date/Time   LABOPIA NONE DETECTED 12/03/2017 1726   COCAINSCRNUR NONE DETECTED 12/03/2017 1726   LABBENZ POSITIVE (A) 12/03/2017 1726   AMPHETMU NONE DETECTED 12/03/2017 1726   THCU NONE DETECTED 12/03/2017 1726   LABBARB NONE DETECTED 12/03/2017 1726    Recent Labs  Lab 12/03/17 0909  ETH <10    I have personally reviewed the radiological images below and agree with the radiology interpretations.  Ct Angio Head W Or Wo Contrast  Result Date: 12/03/2017 CLINICAL DATA:  Code stroke.  Acute onset of right-sided weakness. EXAM: CT ANGIOGRAPHY HEAD AND NECK TECHNIQUE: Multidetector CT imaging of the head and neck was performed  using the standard protocol during bolus administration of intravenous contrast. Multiplanar CT image reconstructions and MIPs were obtained to evaluate the vascular anatomy. Carotid stenosis measurements (when applicable) are obtained utilizing NASCET criteria, using the distal internal carotid diameter as the denominator. CONTRAST:  56mL ISOVUE-370 IOPAMIDOL (ISOVUE-370) INJECTION 76% COMPARISON:  CT head without contrast from the same day at 8:46 a.m. at [facility], [facility]. FINDINGS: CT HEAD FINDINGS Brain: Repeat noncontrast CT of the head again demonstrates remote infarcts of the left frontal operculum, left caudate head, and high right parietal lobe. Remote posterior left cerebellar infarcts are again seen. Anterior left insular hypoattenuation may be slightly worse than on the prior exam. The precentral gyrus is intact. Vascular:  Atherosclerotic calcifications are again noted within the cavernous internal carotid arteries bilaterally. There is no hyperdense vessel. Skull: Calvarium is intact. No focal lytic or blastic lesions are present. Sinuses: Paranasal sinuses and mastoid air cells are clear. Orbits: Remote blowout fracture is again noted in the right orbit. Right lens replacement is present. Globes and orbits are otherwise within normal limits. Review of the MIP images confirms the above findings CTA NECK FINDINGS Aortic arch: A 3 vessel arch configuration is present. Atherosclerotic calcifications are present at the origins of the great vessels without a significant stenosis. There is no aneurysm. Right carotid system: The right common carotid artery is within normal limits. Atherosclerotic changes are noted in the proximal right ICA. More distal right internal carotid artery is within normal limits. Left carotid system: The left common carotid artery is within normal limits. Atherosclerotic calcifications are present at the carotid bifurcation proximal left ICA without significant stenosis. There is mild tortuosity of the more distal left ICA without significant stenosis. Vertebral arteries: Atherosclerotic calcifications are present at the origin of the right vertebral artery without significant stenosis. The left vertebral artery is occluded at its origin. There is very faint reconstitution at the C2-3 level without intraluminal contrast at the foramen magnum. Skeleton: Rightward curvature is present in the lower cervical spine, compensating for leftward curvature in the upper thoracic spine. Other neck: Heterogeneous thyroid is present without a dominant lesion. Patient is intubated. No significant cervical adenopathy is present. Salivary glands are within normal limits. Upper chest: A large right pleural effusion is again seen. The moderate left-sided pleural effusion is present. There is partial collapse of the right upper lobe.  Possible soft tissue fullness is present at the right hila. Recommend dedicated CT of the chest with contrast as tolerated. Review of the MIP images confirms the above findings CTA HEAD FINDINGS Anterior circulation: Atherosclerotic calcifications are present within the cavernous internal carotid arteries bilaterally. There is no significant luminal stenosis relative to the more distal vessels through the ICA terminus. The M1 segments are intact bilaterally. No significant proximal occlusion is present on the right. There is moderate attenuation of superior and posterior M2 divisions. The left M1 segment is intact. Proximal superior left M2 and M3 segment is occluded or highly stenotic. The posterior left M2 division is not visualized. Posterior circulation: The right vertebral artery feeds the basilar. PICA origin is visualized. The basilar artery is small centrally terminating at the superior cerebellar arteries. Fetal type posterior cerebral arteries are present bilaterally with moderate distal segmental stenoses but no significant proximal stenosis or occlusion. Venous sinuses: Dural sinuses are patent. Straight sinus and deep cerebral veins are intact. Cortical veins are unremarkable. Anatomic variants: Fetal type posterior cerebral arteries bilaterally. Delayed phase: Postcontrast images are not  performed in the acute setting. Review of the MIP images confirms the above findings IMPRESSION: 1. Proximal posterior division left M2 occlusion. 2. More distal anterior division left M2/M3 occlusion. 3. Extensive intracranial small vessel disease. 4. Occluded left vertebral artery with minimal reconstitution in the neck. 5. Fetal type posterior cerebral arteries bilaterally. 6. Atherosclerotic changes at the carotid bifurcations and cavernous internal carotid arteries bilaterally without other significant stenosis. 7. Aortic Atherosclerosis (ICD10-I70.0). Calcifications involve the great vessel origins without  significant stenoses. 8. Large bilateral pleural effusions, right greater than left. 9. Parenchymal volume loss in the right upper lobe likely related to the effusions. Central mass lesion not excluded. 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 Recommend dedicated CT the chest with contrast as the patient's condition allows. 10. These results were called by telephone at the time of interpretation on 12/03/2017 at 11:02 am to Dr. Roland Rack , who verbally acknowledged these results. Electronically Signed   By: San Morelle M.D.   On: 12/03/2017 11:05   Ct Head Wo Contrast  Result Date: 12/07/2017 CLINICAL DATA:  Followup intracranial hemorrhage EXAM: CT HEAD WITHOUT CONTRAST TECHNIQUE: Contiguous axial images were obtained from the base of the skull through the vertex without intravenous contrast. COMPARISON:  12/04/2017 and previous. FINDINGS: Brain: Small area of post treatment hemorrhage in the left insular region continues to diminish, measuring 8 by 14 mm today. No progressive or new hemorrhage. Subacute infarction in the left insular and posterior frontal region appears the same. Evidence of increased swelling. No shift. No other acute infarction. Atrophy and chronic small-vessel ischemic changes elsewhere appear the same. Vascular: There is atherosclerotic calcification of the major vessels at the base of the brain. Skull: Negative Sinuses/Orbits: Clear sinuses. Old orbital blowout fracture on the right. Other: None IMPRESSION: No worsening or progressive findings. Diminishing conspicuity of an 8 by 14 mm region of post treatment hemorrhage in the left insula. No evidence of infarct extension. Electronically Signed   By: Nelson Chimes M.D.   On: 12/07/2017 06:38   Ct Head Wo Contrast  Result Date: 12/04/2017 CLINICAL DATA:  Follow-up exam for acute stroke, status post catheter directed revascularization. EXAM: CT HEAD WITHOUT CONTRAST TECHNIQUE: Contiguous axial images were  obtained from the base of the skull through the vertex without intravenous contrast. COMPARISON:  Prior CT from earlier the same day. FINDINGS: Brain: Previously noted hyperdensity at the level of the left insula again seen, measuring 10 x 12 x 14 mm (transverse by AP by craniocaudad). Overall, this is similar in size with slightly decreased attenuation as compared to previous. Given persistence, hemorrhage is favored, although a degree of contrast staining may be contributory. Evolving acute left MCA territory infarcts within the adjacent all left frontal operculum and posterior frontal region again seen, slightly more apparent on current exam. No mass effect. No other acute intracranial hemorrhage. No other acute large vessel territory infarct. No mass lesion or midline shift. No hydrocephalus. No extra-axial fluid collection. Underlying atrophy with chronic small vessel ischemic changes again noted. Vascular: No new hyperdense vessel. Scattered vascular calcifications noted within the carotid siphons. Skull: No acute scalp soft tissue abnormality.  Calvarium intact. Sinuses/Orbits: Globes and orbital soft tissues within normal limits. Small air-fluid levels noted within the sphenoid sinuses. Scattered mucosal thickening throughout the ethmoidal air cells and maxillary sinuses. Trace right mastoid effusion. Other: None. IMPRESSION: 1. Persistent hyperdensity at the left insula, similar in size measuring up to 14 mm with slightly decreased attenuation. Hemorrhage  is favored. 2. Additional multifocal areas of evolving left MCA territory infarction, stable in distribution as compared to previous. 3. No other new acute intracranial abnormality. Electronically Signed   By: Jeannine Boga M.D.   On: 12/04/2017 14:29   Ct Head Wo Contrast  Result Date: 12/04/2017 CLINICAL DATA:  77 y/o F; stroke post catheter thrombectomy for follow-up. EXAM: CT HEAD WITHOUT CONTRAST TECHNIQUE: Contiguous axial images were  obtained from the base of the skull through the vertex without intravenous contrast. COMPARISON:  11/04/2017 CT head, CTA head, angiogram head. FINDINGS: Brain: Amorphous density within the left anterior insula measuring 10 x 9 x 16 mm (AP x ML x CC series 6, image 17 and series 5, image 46). Hypoattenuation within the left lateral frontal lobe, frontal operculum, and insula. Small chronic infarcts in the posterior left cerebellum, left caudate head, right parietal lobe, and right frontal operculum. No hydrocephalus, extra-axial collection, or effacement of basilar cisterns. Vascular: Calcific atherosclerosis of carotid siphons. Skull: Normal. Negative for fracture or focal lesion. Sinuses/Orbits: Chronic right lamina papyracea fracture. Partial opacification of paranasal sinuses likely due to recent intubation. Other: None. IMPRESSION: 1. Amorphous density in left anterior insula measuring up to 16 mm, probably enhancing infarction, although hemorrhage is possible. Follow-up is recommended to ensure stability. 2. Additional area of hypoattenuation within left lateral frontal lobe, frontal operculum, insula compatible with evolving infarction. 3. Multiple additional stable chronic infarcts as above. These results were called by telephone at the time of interpretation on 12/04/2017 at 5:31 am to Dr. Rory Percy, who verbally acknowledged these results. Electronically Signed   By: Kristine Garbe M.D.   On: 12/04/2017 05:33   Ct Angio Neck W Or Wo Contrast  Result Date: 12/03/2017 CLINICAL DATA:  Code stroke.  Acute onset of right-sided weakness. EXAM: CT ANGIOGRAPHY HEAD AND NECK TECHNIQUE: Multidetector CT imaging of the head and neck was performed using the standard protocol during bolus administration of intravenous contrast. Multiplanar CT image reconstructions and MIPs were obtained to evaluate the vascular anatomy. Carotid stenosis measurements (when applicable) are obtained utilizing NASCET criteria, using  the distal internal carotid diameter as the denominator. CONTRAST:  22mL ISOVUE-370 IOPAMIDOL (ISOVUE-370) INJECTION 76% COMPARISON:  CT head without contrast from the same day at 8:46 a.m. at [facility]. FINDINGS: CT HEAD FINDINGS Brain: Repeat noncontrast CT of the head again demonstrates remote infarcts of the left frontal operculum, left caudate head, and high right parietal lobe. Remote posterior left cerebellar infarcts are again seen. Anterior left insular hypoattenuation may be slightly worse than on the prior exam. The precentral gyrus is intact. Vascular: Atherosclerotic calcifications are again noted within the cavernous internal carotid arteries bilaterally. There is no hyperdense vessel. Skull: Calvarium is intact. No focal lytic or blastic lesions are present. Sinuses: Paranasal sinuses and mastoid air cells are clear. Orbits: Remote blowout fracture is again noted in the right orbit. Right lens replacement is present. Globes and orbits are otherwise within normal limits. Review of the MIP images confirms the above findings CTA NECK FINDINGS Aortic arch: A 3 vessel arch configuration is present. Atherosclerotic calcifications are present at the origins of the great vessels without a significant stenosis. There is no aneurysm. Right carotid system: The right common carotid artery is within normal limits. Atherosclerotic changes are noted in the proximal right ICA. More distal right internal carotid artery is within normal limits. Left carotid system: The left common carotid artery is within normal limits. Atherosclerotic calcifications are present  at the carotid bifurcation proximal left ICA without significant stenosis. There is mild tortuosity of the more distal left ICA without significant stenosis. Vertebral arteries: Atherosclerotic calcifications are present at the origin of the right vertebral artery without significant stenosis. The left vertebral artery is occluded at  its origin. There is very faint reconstitution at the C2-3 level without intraluminal contrast at the foramen magnum. Skeleton: Rightward curvature is present in the lower cervical spine, compensating for leftward curvature in the upper thoracic spine. Other neck: Heterogeneous thyroid is present without a dominant lesion. Patient is intubated. No significant cervical adenopathy is present. Salivary glands are within normal limits. Upper chest: A large right pleural effusion is again seen. The moderate left-sided pleural effusion is present. There is partial collapse of the right upper lobe. Possible soft tissue fullness is present at the right hila. Recommend dedicated CT of the chest with contrast as tolerated. Review of the MIP images confirms the above findings CTA HEAD FINDINGS Anterior circulation: Atherosclerotic calcifications are present within the cavernous internal carotid arteries bilaterally. There is no significant luminal stenosis relative to the more distal vessels through the ICA terminus. The M1 segments are intact bilaterally. No significant proximal occlusion is present on the right. There is moderate attenuation of superior and posterior M2 divisions. The left M1 segment is intact. Proximal superior left M2 and M3 segment is occluded or highly stenotic. The posterior left M2 division is not visualized. Posterior circulation: The right vertebral artery feeds the basilar. PICA origin is visualized. The basilar artery is small centrally terminating at the superior cerebellar arteries. Fetal type posterior cerebral arteries are present bilaterally with moderate distal segmental stenoses but no significant proximal stenosis or occlusion. Venous sinuses: Dural sinuses are patent. Straight sinus and deep cerebral veins are intact. Cortical veins are unremarkable. Anatomic variants: Fetal type posterior cerebral arteries bilaterally. Delayed phase: Postcontrast images are not performed in the acute  setting. Review of the MIP images confirms the above findings IMPRESSION: 1. Proximal posterior division left M2 occlusion. 2. More distal anterior division left M2/M3 occlusion. 3. Extensive intracranial small vessel disease. 4. Occluded left vertebral artery with minimal reconstitution in the neck. 5. Fetal type posterior cerebral arteries bilaterally. 6. Atherosclerotic changes at the carotid bifurcations and cavernous internal carotid arteries bilaterally without other significant stenosis. 7. Aortic Atherosclerosis (ICD10-I70.0). Calcifications involve the great vessel origins without significant stenoses. 8. Large bilateral pleural effusions, right greater than left. 9. Parenchymal volume loss in the right upper lobe likely related to the effusions. Central mass lesion not excluded. 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 Recommend dedicated CT the chest with contrast as the patient's condition allows. 10. These results were called by telephone at the time of interpretation on 12/03/2017 at 11:02 am to Dr. Roland Rack , who verbally acknowledged these results. Electronically Signed   By: San Morelle M.D.   On: 12/03/2017 11:05   Ct Chest Wo Contrast  Addendum Date: 12/04/2017   ADDENDUM REPORT: 12/04/2017 17:04 ADDENDUM: The upper abdominal section of the original report was inadvertently not included and is dictated below. UPPER ABDOMEN: Liver margin appears irregular. Decreased attenuation in the dome is likely due to dilated hepatic veins. Stones are seen in the gallbladder. Probable adrenal thickening. Visualized portions of the kidneys, spleen, pancreas and stomach are grossly unremarkable. Upper abdominal lymph nodes do not appear enlarged by CT size criteria. Ascites. IMPRESSION: 8. Marginal irregularity of the liver is indicative of cirrhosis. 9.  Cholelithiasis. 10.     Ascites. Electronically Signed   By: Lorin Picket M.D.   On: 12/04/2017 17:04   Result  Date: 12/04/2017 CLINICAL DATA:  Pleural effusions. EXAM: CT CHEST WITHOUT CONTRAST TECHNIQUE: Multidetector CT imaging of the chest was performed following the standard protocol without IV contrast. COMPARISON:  Chest radiograph 12/03/2017 and CT chest 01/01/2017. FINDINGS: Cardiovascular: Right IJ central line terminates in the SVC. Atherosclerotic calcification of the arterial vasculature, including coronary arteries. Pulmonary arteries are enlarged. Heart is moderately enlarged with a moderate pericardial effusion. Mediastinum/Nodes: Low internal jugular and mediastinal adenopathy measures up to 1.1 cm in the right paratracheal station. There are calcified mediastinal and bihilar lymph nodes. Distal periesophageal lymph node measures 11 mm, as before. No axillary adenopathy. Lungs/Pleura: Image quality is degraded by respiratory motion. Large right pleural effusion and adjacent collapse/consolidation obscure the majority of the right hemithorax. Nodular areas in the apex of the left upper lobe are grossly stable. Scattered areas of peribronchovascular ground-glass in the left upper lobe, possibly infectious or inflammatory in etiology. Moderate left pleural effusion with collapse/consolidation in the left lower lobe. Endotracheal tube terminates at the orifice of the left mainstem bronchus. Musculoskeletal: Degenerative changes in the spine. No worrisome lytic or sclerotic lesions. IMPRESSION: 1. Endotracheal tube is low lying, at the orifice of the left mainstem bronchus. Pulling back 3-4 cm would likely better position the tip above the carina. 2. Large right pleural effusion with collapse/consolidation in the adjacent right lung. 3. Moderate left pleural effusion with collapse/consolidation in the left lower lobe. 4. Scattered areas of nodularity and ground-glass in the left upper lobe, poorly evaluated due to respiratory motion. 5. Moderate pericardial effusion. 6. Aortic atherosclerosis (ICD10-170.0).  Coronary artery calcification. 7. Enlarged pulmonary arteries, indicative of pulmonary arterial hypertension. Electronically Signed: By: Lorin Picket M.D. On: 12/04/2017 16:47    Ct Head Code Stroke Wo Contrast  Result Date: 12/03/2017 CLINICAL DATA:  Code stroke. Acute onset of unresponsiveness while in the emergency department. Patient is known nonverbal. Acute onset of right-sided weakness. EXAM: CT HEAD WITHOUT CONTRAST TECHNIQUE: Contiguous axial images were obtained from the base of the skull through the vertex without intravenous contrast. COMPARISON:  None. FINDINGS: Brain: No acute infarct, hemorrhage, or mass lesion is present. 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 Hypodense infarct is present in the high right parietal lobe, likely also remote. A remote infarct of the left frontal operculum is present. Associated volume loss is noted. No acute left-sided infarct is present. No acute hemorrhage or mass lesion is present. Remote infarcts are present posteriorly within the left cerebellum. Brainstem is unremarkable. No significant extra-axial fluid collection is present. No significant extra-axial fluid collection is present. Vascular: Atherosclerotic calcifications are present within the internal carotid arteries bilaterally. There is no hyperdense vessel. Skull: Calvarium is intact. No focal lytic or blastic lesions are present. Sinuses/Orbits: The paranasal sinuses are clear. A remote right orbital blowout fracture is present. A right lens replacement is present. Globes and orbits are otherwise within normal limits. ASPECTS [facility] Stroke Program Early CT Score) - Ganglionic level infarction (caudate, lentiform nuclei, internal capsule, insula, M1-M3 cortex): 7/7 - Supraganglionic infarction (M4-M6 cortex): 3/3. Total score (0-10 with 10 being normal): 10/10. IMPRESSION: 1. Infarcts involving the high right parietal  lobe and left frontal operculum appear remote. 2. Remote lacunar infarct of the left caudate head. 3. Remote infarcts of the posterior left cerebellum.  4. No acute infarct. 5. 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 6. ASPECTS is 10/10 These results were called by telephone at the time of interpretation on 12/03/2017 at 9:03 am to Dr. Blanchie Dessert , who verbally acknowledged these results. Electronically Signed   By: San Morelle M.D.   On: 12/03/2017 09:08    TTE - Left ventricle: The cavity size was below normal. Wall thickness   was increased in a pattern of mild LVH. Systolic function was   vigorous. The estimated ejection fraction was in the range of 65%   to 70%. Wall motion was normal; there were no regional wall   motion abnormalities. Doppler parameters are consistent with   abnormal left ventricular relaxation (grade 1 diastolic   dysfunction). Doppler parameters are consistent with high   ventricular filling pressure. - Ventricular septum: The contour showed diastolic flattening and   systolic flattening. These changes are consistent with RV volume   and pressure overload. - Aortic valve: Mildly calcified annulus. Trileaflet. - Mitral valve: Severely calcified annulus. Severely calcified   leaflets . The findings are consistent with mild stenosis. Mean   gradient (D): 4 mm Hg. Valve area by pressure half-time: 2.12   cm^2. Valve area by continuity equation (using LVOT flow): 0.81   cm^2. - Right ventricle: The cavity size was severely dilated. Pacer wire   or catheter noted in right ventricle. Systolic function was   reduced. - Right atrium: The atrium was severely dilated. Pacer wire or   catheter noted in right atrium. - Tricuspid valve: There was severe regurgitation. - Pulmonic valve: There was mild regurgitation. - Pulmonary arteries: Systolic pressure was severely increased. PA   peak pressure: 71 mm Hg  (S). - Inferior vena cava: The CVP could not accurately be assessed as   the patient was ventilated (via IVC assessment). - Pericardium, extracardiac: A moderate to large size pericardial   effusion was noted. Features were not consistent with tamponade   physiology.   PHYSICAL EXAM  Temp:  [97.5 F (36.4 C)-98.4 F (36.9 C)] 97.5 F (36.4 C) (05/08 0800) Pulse Rate:  [52-95] 80 (05/08 1000) Resp:  [15-27] 22 (05/08 0700) BP: (67-132)/(49-88) 112/64 (05/08 1000) SpO2:  [54 %-100 %] 95 % (05/08 1000) Arterial Line BP: (85-124)/(47-60) 102/52 (05/07 1700)  General - Well nourished, well developed, in no acute distress  Ophthalmologic - fundi not visualized due to noncooperation.  Cardiovascular - Regular rate and rhythm, not in afib.  Neuro - awake, alert, orientated to self and place, but not to time or age, perseveration to names and objects, able to repeat, able to follow simple commands, limited spontaneous speech, no dysarthria. PERRL, EOMI but incomplete gaze bilaterally, blinking to visual threat bilaterally. Facial symmetric and tongue mid position. Moving all extremities equally and at least 4+/5. DTR 1+ and no babinski. Sensation symmetrical, coordination not cooperative and gait not tested.   ASSESSMENT/PLAN Ana Gardner is a 77 y.o. 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 No tPA given due to recent GI bleeding.  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 Contrast  Repeat CT x 2 - left inferior insular small ICH  2D Echo EF 65-70%, severely dilated RA, moderate to large pericardial effusion  LDL 38  HgbA1c 8.6  SCDs for VTE prophylaxis  On diet   No antithrombotic prior to admission, now on on ASA 81mg  after repeat CT.  stable hematoma  Ongoing aggressive stroke risk factor management  Therapy recommendations:  CIR  Disposition:  Pending   Hemorrhagic conversion post procedure - left inferior insular  CT repeat x 2 no hematoma enlargement  INR 1.85->1.76->1.53->1.51 due to cirrhosis   No need to reverse INR this time, as neuro stable and ICH is small and high risk of thromboembolic event  Repeat CT stable hematoma and ASA 81mg  started  Respiratory distress due to b/l large pleural effusion, pericardial effusion and sarcoidosis  Bilateral large pleural effusion confirmed on CT  Long hx of sarcoidosis with lung disease  self extubated, tolerating well  CCM on board  AFib not on AC but INR 1.7-1.8 baseline  Was on eliquis 5mg  bid with high INR  Stopped in 10/2017 due to concerns of black stool   EGD showed severe portal hypertensive gastropathy  eliquis discontinued  Planned for more GI work up as outpt  Currently not on [facility] or antiplatelet due to anemia and ICH as well as elevated INR  Rate controlled   Given severe PHG, GIB, elevated INR, and anemia, not good candidate for additional anticoagulation at this time.   Right heart CHF  TTE EF 65-70%, severely dilated RA, moderate to large pericardial effusion  CCM on board  CT showed large pleural effusion  Self extubated and tolerating well so far  Consider cardiology consult if felt  appropriate  Alcoholic liver cirrhosis  AST/ALT WNL  INR high in 10/2017  INR 1.85->1.76->1.53->1.51  EGD showed severe PHG   Recent GIB in 10/2017 due to severe PHG - GI following as outpt  No antithrombotics PTA   Anemia   Hb 10.1->9.1->8.3->8.0  Close monitoring  Will check stool occult blood  Likely due to recent GIB, IR procedure, and PHG  Consider PRBC if needed  CKD, improving  Cre 2.04->1.98->1.43->1.32->1.36  Gentle hydration  I/O monitoring  Diabetes  HgbA1c 8.6 goal < 7.0  Uncontrolled  CBG monitoring - glucose stable  SSI  DM education and close PCP follow up  Hypotension, stable on levophed  BP stable  off levophed now  BP goal 110-140   Other Stroke Risk Factors  Advanced age  ETOH use  Other Active Problems  Pacemaker present  Left breast cancer in situ on tamoxifen   Hospital day # 4  This patient is critically ill due to left MCA infarct, severe right-sided CHF s/p pacer, bilateral pleural effusion, atrial fibrillation off Eliquis due to recent GI bleeding and portal hypertensive gastropathy, 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 This patient's care requires constant monitoring of vital signs, hemodynamics, respiratory and cardiac monitoring, review of multiple databases, neurological assessment, discussion  with family, other specialists and medical decision making of high complexity. I spent 35 minutes of neurocritical care time in the care of this patient. I discussed daughter at bedside.  Ana Hawking, MD PhD Stroke Neurology 12/07/2017 11:08 AM    To contact Stroke Continuity provider, please refer to [email or web address] After hours, contact General Neurology

## 2017-12-07 NOTE — Progress Notes (Signed)
Occupational Therapy Treatment Patient Details Name: Ana Gardner MRN: [id number] DOB: 1941-05-03 Today's Date: 12/07/2017    History of present illness 77 y.o. 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 No tPA given due to recent GI bleeding.  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   OT comments  Pt progressing towards established OT goals. Pt's functional performance continues to be impacted by decreased cognition. Pt brushing her hair at EOB with Min Guard A and Min VCs to sustain attention to task. Pt washing her hands at sink and requiring Min Guard A for safety and Max cues to sustain attention to task and complete fully. Continue to recommend post-acute rehab and will continue to follow acutely as admitted.    Follow Up Recommendations  CIR;LTACH(depending on respiratory status )    Equipment Recommendations  None recommended by OT    Recommendations for Other Services Rehab consult(vs LTACH)    Precautions / Restrictions Precautions Precautions: Fall       Mobility Bed Mobility Overal bed mobility: Needs Assistance Bed Mobility: Supine to Sit Rolling: Min assist         General bed mobility comments: cues coming up and forward to EOB.  pt needed extra cuing to push with hands to scoot.  Transfers Overall transfer level: Needs assistance   Transfers: Sit to/from Stand;Stand Pivot Transfers Sit to Stand: Min assist;+2 safety/equipment Stand pivot transfers: Min assist;+2 safety/equipment       General transfer comment: cues for hand placement and stability assist.  Assist for guidance and redirection.    Balance  Overall balance assessment: Needs assistance   Sitting balance-Leahy Scale: Fair     Standing balance support: During functional activity Standing balance-Leahy Scale: Poor Standing balance comment: min assist for stability in standing due to extensive posterior lean                           ADL either performed or assessed with clinical judgement   ADL Overall ADL's : Needs assistance/impaired     Grooming: Wash/dry hands;Standing;Min guard;Brushing hair;Cueing for sequencing;Cueing for safety;Sitting Grooming Details (indicate cue type and reason): Pt combing her hair at EOB using LUE and demonstrating WFL grasp. Requiring cues for ocmpleting task and brushing right side. Pt with poor attention and requiring cues to complete task. Pt requiring Min Guard A for safety while standing at sink.                              Functional mobility during ADLs: Minimal assistance;+2 for safety/equipment;Cueing for sequencing General ADL Comments: Pt with decreased attention, problem solving, and awareness. Pt requiring increased cues and encouragement to complete task fully.      Vision       Perception     Praxis      Cognition Arousal/Alertness: Awake/alert Behavior During Therapy: WFL for tasks assessed/performed Overall Cognitive Status: Difficult to assess(not tested formally)                                 General Comments: cuing for encouragement, redirection,  attension        Exercises     Shoulder Instructions       General Comments Daughter present throughout session    Pertinent Vitals/ Pain       Pain Assessment: Faces Faces Pain Scale: Hurts a little bit Pain Location: stomach Pain Descriptors / Indicators: Aching;Grimacing Pain Intervention(s): Monitored during session;Limited activity within patient's tolerance;Repositioned  Home Living                                          Prior  Functioning/Environment              Frequency  Min 2X/week        Progress Toward Goals  OT Goals(current goals can now be found in the care plan section)  Progress towards OT goals: Progressing toward goals  Acute Rehab OT Goals Patient Stated Goal: to be independent via daughter Time For Goal Achievement: 12/19/17 Potential to Achieve Goals: Good ADL Goals Pt Will Perform Grooming: with mod assist;sitting Pt Will Perform Upper Body Bathing: with mod assist;sitting Pt Will Perform Lower Body Bathing: with mod assist;sit to/from stand Pt Will Transfer to Toilet: with min assist;ambulating;regular height toilet;bedside commode;grab bars Pt Will Perform Toileting - Clothing Manipulation and hygiene: with mod assist;sit to/from stand Additional ADL Goal #1: Pt will sustain attention to familiar ADL tasks x 4 mins with min cues Additional ADL Goal #2: Pt will locate items on her Rt with min cues during ADLs  Plan Discharge plan remains appropriate    Co-evaluation    PT/OT/SLP Co-Evaluation/Treatment: Yes Reason for Co-Treatment: To address functional/ADL transfers PT goals addressed during session: Mobility/safety with mobility OT goals addressed during session: ADL's and self-care      AM-PAC PT "6 Clicks" Daily Activity     Outcome Measure   Help from another person eating meals?: A Lot Help from another person taking care of personal grooming?: A Lot Help from another person toileting, which includes using toliet, bedpan, or urinal?: A Lot Help from another person bathing (including washing, rinsing, drying)?: A Lot Help from another person to put on and taking off regular upper body clothing?: A Lot Help from another person to put on and taking off regular lower body clothing?: A Lot 6 Click Score: 12    End of Session Equipment Utilized During Treatment: Gait belt;Oxygen(3L)  OT Visit Diagnosis: Unsteadiness on feet (R26.81);Cognitive communication deficit  (R41.841);Hemiplegia and hemiparesis Symptoms and signs involving cognitive functions: Cerebral infarction Hemiplegia - Right/Left: Right Hemiplegia - dominant/non-dominant: Dominant Hemiplegia - caused by: Cerebral infarction   Activity Tolerance Patient limited by fatigue   Patient Left in chair;with call bell/phone within reach;with chair alarm set;with family/visitor present   Nurse Communication Mobility status        Time: 1610-9604 OT Time Calculation (min): 26 min  Charges: OT General Charges $OT Visit: 1 Visit OT Treatments $Self Care/Home Management : 8-22 mins  [place], OTR/L Acute Rehab Pager: [phone] Office: [place] 12/07/2017, 4:38 PM

## 2017-12-07 NOTE — Progress Notes (Signed)
Physical Therapy Treatment Patient Details Name: Ana Gardner MRN: [id number] DOB: [birth year] Today's Date: 12/07/2017    History of Present Illness 77 y.o. 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 No tPA given due to recent GI bleeding.  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    PT Comments    Noticeable improvement from last treatment note.  Pt self-limiting due to stomach discomfort, but mobilized at a min assist level for basic activities.   Follow Up Recommendations  CIR     Equipment Recommendations  Other (comment)(TBA)    Recommendations for Other Services Rehab consult     Precautions / Restrictions Precautions Precautions: Fall    Mobility  Bed Mobility Overal bed mobility: Needs Assistance Bed Mobility: Supine to Sit Rolling: Min assist         General bed mobility comments: cues coming up and forward to EOB.  pt needed extra cuing to push with hands to scoot.  Transfers Overall transfer level: Needs assistance   Transfers: Sit to/from Stand;Stand Pivot Transfers Sit to Stand: Min assist;+2 safety/equipment Stand pivot transfers: Min assist;+2 safety/equipment       General transfer comment: cues for hand placement and stability assist.  Assist for guidance and redirection.  Ambulation/Gait Ambulation/Gait assistance: Min assist;+2 safety/equipment Ambulation Distance (Feet): 10 Feet Assistive device: 1 person hand held assist       General Gait Details: pt was guarded, but generally steady with short distance ambulation to sink.  pt should be able to do much more, but her stomach was  hurting.   Stairs             Wheelchair Mobility    Modified Rankin (Stroke Patients Only) Modified Rankin (Stroke Patients Only) Pre-Morbid Rankin Score: No symptoms Modified Rankin: Moderately severe disability     Balance Overall balance assessment: Needs assistance   Sitting balance-Leahy Scale: Fair     Standing balance support: During functional activity Standing balance-Leahy Scale: Poor Standing balance comment: min assist for stability in standing due to extensive posterior lean                            Cognition Arousal/Alertness: Awake/alert Behavior During Therapy: WFL for tasks assessed/performed Overall Cognitive Status: Difficult to assess(not tested formally)                                 General Comments: cuing for encouragement, redirection, attension      Exercises      General Comments General comments (skin integrity, edema, etc.): sats on RA held around 90% throughout session.  Oxygen reapplied at 3L [name]      Pertinent Vitals/Pain Pain Assessment: Faces Faces Pain Scale: Hurts a little bit Pain Location: stomach Pain Descriptors / Indicators: Aching;Grimacing Pain Intervention(s): Monitored during session;Limited activity within patient's tolerance    Home Living                      Prior Function            PT Goals (current goals can now be found in the care  plan section) Acute Rehab PT Goals Patient Stated Goal: to be independent via daughter PT Goal Formulation: With family Time For Goal Achievement: 12/18/17 Potential to Achieve Goals: Good Progress towards PT goals: Progressing toward goals    Frequency    Min 4X/week      PT Plan Current plan remains appropriate    Co-evaluation   Reason for Co-Treatment: To address functional/ADL transfers PT goals addressed during session: Mobility/safety with mobility        AM-PAC PT "6 Clicks" Daily Activity  Outcome  Measure  Difficulty turning over in bed (including adjusting bedclothes, sheets and blankets)?: Unable Difficulty moving from lying on back to sitting on the side of the bed? : A Little Difficulty sitting down on and standing up from a chair with arms (e.g., wheelchair, bedside commode, etc,.)?: Unable Help needed moving to and from a bed to chair (including a wheelchair)?: A Little Help needed walking in hospital room?: A Little Help needed climbing 3-5 steps with a railing? : A Lot 6 Click Score: 13    End of Session   Activity Tolerance: Patient tolerated treatment well Patient left: in chair;with call bell/phone within reach;with chair alarm set;with family/visitor present Nurse Communication: Mobility status PT Visit Diagnosis: Other symptoms and signs involving the nervous system (P49.826)     Time: 4158-3094 PT Time Calculation (min) (ACUTE ONLY): 26 min  Charges:  $Therapeutic Activity: 8-22 mins                    G Codes:       12-31-2017  Donnella Sham, PT [phone]  (pager)   Tessie Fass Denson Niccoli 2017-12-31, 2:29 PM

## 2017-12-07 NOTE — Progress Notes (Addendum)
PULMONARY / CRITICAL CARE MEDICINE   Name: Ana Gardner MRN: [id number] DOB: 1941-03-03    ADMISSION DATE:  12/03/2017 CONSULTATION DATE:  12/03/2017  REFERRING MD:  Estanislado Pandy   CHIEF COMPLAINT:  Right sided weakness   HISTORY OF PRESENT ILLNESS:   77 y.o. 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  Patient walked into to the [facility] ED around 8am today with complaints of shortness of breath and edema in her bilateral lower extremities over the last month.   patient was discharged 11/29/17, with complaints of shortness of breath secondary to bilateral pleural effusion -right more than left, likely related to hypervolemic state from congestive heart failure and cirrhosis of liver. Ultrasound guided thoracentesis done 11/28/2017 with 1 L of pleural fluid drained right and another thoracentesis done on 11/29/2017 650 mL of fluid with drained from the left pleural space. She was started on torsemide and metolazone.  Patient presented to the ER, x-ray of the chest showed large recurrent right pleural effusion as well as underlying consolidation/atelectasis of the right lung. Today around 8:30 AM in the ED patient became acutely aphasic with dense right hemiplegia as well as forced left gaze deviation.  Emergent telemetry stroke was consulted at that time patient had NIHSS of 26.  Immediate CT Noncon was done that shows infarct involving the high right parietal lobe and  left frontal operculum.  Patient could not undergo CT angiogram at that time due to respiratory status.  , she was unable to follow commands or protect her airway and was intubated immediately. Patient was transferred immediately to [facility] for further stroke management. On arrival to [facility] patient immediately went to CT.  CT head/neck Angio  Showed proximal posterior division left M2 occlusion, More distal anterior division left M2/M3 occlusion as well aso occluded left vertebral artery with minimal reconstitution in the neck.  Neuro interventionalist was consulted immediately for endovascular revascularization Patient underwent and underwent revascularization of the left MCA.  Patient brought back to the ICU intubated.  SUBJECTIVE:   Self extubated 5/7. Tol well.  Pulled out R IJ CVL this am.  Eating breakfast, looks good, no c/o.    VITAL SIGNS: BP 128/84 (BP Location: Right Arm)   Pulse 88   Temp 98 F (36.7 C) (Oral)   Resp (!) 22   Ht '5\' 6"'$  (1.676 m)   Wt 82.3 kg (181 lb 7 oz)   SpO2 99%   BMI 29.28 kg/m      INTAKE / OUTPUT: I/O last 3 completed shifts: In: 1360.1 [P.O.:175; I.V.:528.9; NG/GT:656.3] Out: 2175 [Urine:2175]  PHYSICAL EXAMINATION: General: chronically ill appearing female, NAD eating breakfast in bed  Neuro: awake, follows commands, answers appropriately, pleasantly confused  HEENT:  [place] / AT.  MMM.  Cardiovascular:  RRR, 3/6 SEM Lungs:  resps even non labored on [place] Abdomen:  Mildly distended, soft, non tender  Musculoskeletal:  1+ BLE edema  Skin:  No rash   LABS:  BMET Recent Labs  Lab 12/05/17 0450 12/06/17 0431 12/07/17 0334  NA 134* 136  139  K 3.6 4.2 4.5  CL 101 103 106  CO2 '24 24 26  '$ BUN 26* 24* 25*  CREATININE 1.43* 1.32* 1.36*  GLUCOSE 163* 225* 169*    Electrolytes Recent Labs  Lab 12/05/17 0450 12/05/17 1854 12/06/17 0431 12/07/17 0334  CALCIUM 8.2*  --  8.4* 8.7*  MG 1.8 1.8 2.0 1.9  PHOS  --  3.5 3.7 3.6     CBC Recent Labs  Lab 12/04/17 0417 12/06/17 0431 12/07/17 0334  WBC 19.0* 16.1* 10.1  HGB 9.1* 8.3* 8.0*  HCT 28.1* 26.1* 25.5*  PLT 408* 313 306    Coag's Recent Labs  Lab 12/05/17 0450 12/06/17 0431 12/07/17 0334  INR 1.76 1.53 1.51    Sepsis Markers Recent Labs  Lab 12/05/17 0450  PROCALCITON 0.50    ABG Recent Labs  Lab 12/03/17 0942 12/03/17 1139 12/03/17 2341  PHART 7.546* 7.516* 7.431  PCO2ART 32.0 34.8 34.0  PO2ART 140.0* 250.0* 88.2    Liver Enzymes Recent Labs  Lab 12/03/17 0909  AST 19  ALT 10*  ALKPHOS 48  BILITOT 1.0  ALBUMIN 2.8*    Cardiac Enzymes Recent Labs  Lab 12/03/17 0909  TROPONINI <0.03    Glucose Recent Labs  Lab 12/03/17 0916 12/03/17 1010 12/03/17 1327 12/06/17 1733 12/06/17 2127 12/07/17 0815  GLUCAP 118* 113* 120* 223* 171* 159*        Current Facility-Administered Medications:  .  0.9 %  sodium chloride infusion, 250 mL, Intravenous, PRN, Aldean Jewett, MD, Stopped at 12/07/17 0600 .  Place/Maintain arterial line, , , Until Discontinued **AND** 0.9 %  sodium chloride infusion, , Intra-arterial, PRN, Deterding, Guadelupe Sabin, MD .  acetaminophen (TYLENOL) tablet 650 mg, 650 mg, Oral, Q4H PRN **OR** acetaminophen (TYLENOL) solution 650 mg, 650 mg, Per Tube, Q4H PRN **OR** acetaminophen (TYLENOL) suppository 650 mg, 650 mg, Rectal, Q4H PRN, Deveshwar, Sanjeev, MD .  aspirin EC tablet 81 mg, 81 mg, Oral, Daily, Rosalin Hawking, MD .  chlorhexidine gluconate (MEDLINE KIT) (PERIDEX) 0.12 % solution 15 mL, 15 mL, Mouth Rinse, BID, Aljishi, Wael Z, MD, 15 mL at 12/07/17 0800 .  docusate (COLACE) 50 MG/5ML liquid 100 mg, 100 mg, Oral, BID PRN, Desai, Rahul P, PA-C .  furosemide (LASIX) injection 40 mg, 40 mg, Intravenous, Q12H, Desai, Rahul P, PA-C, 40 mg at 12/06/17 2218 .  insulin aspart (novoLOG) injection 0-15 Units, 0-15 Units, Subcutaneous, TID WC, Aljishi, Virgina Norfolk, MD, 3 Units at 12/07/17 0900 .  MEDLINE mouth  rinse, 15 mL, Mouth Rinse, BID, Aljishi, Virgina Norfolk, MD .  norepinephrine (LEVOPHED) 16 mg in dextrose 5 % 250 mL (0.064 mg/mL) infusion, 0-40 mcg/min, Intravenous, Titrated, Skeet Simmer, [facility], Stopped at 12/06/17 1535 .  pantoprazole (PROTONIX) EC tablet 40 mg, 40 mg, Oral, Daily, Sivakumar, Siva P, MD, 40 mg at 12/07/17 0948 .  potassium chloride 20 MEQ/15ML (10%) solution 20 mEq, 20 mEq, Oral, BID, Desai, Rahul P, PA-C, 20 mEq at 12/06/17 2218 .  sodium chloride flush (NS) 0.9 % injection 10-40 mL, 10-40 mL, Intracatheter, Q12H, Ollis, Brandi L, NP, 30 mL at 12/06/17 2219 .  sodium chloride flush (NS) 0.9 % injection 10-40 mL, 10-40 mL, Intracatheter, PRN, Ollis, Brandi L, NP .  spironolactone (ALDACTONE) tablet 100 mg, 100 mg, Oral, Daily, Desai, Rahul P, PA-C, 100 mg at 12/07/17 8887   Imaging Ct Head Wo Contrast  Result Date: 12/07/2017 CLINICAL DATA:  Followup intracranial hemorrhage EXAM: CT HEAD WITHOUT CONTRAST TECHNIQUE: Contiguous axial  images were obtained from the base of the skull through the vertex without intravenous contrast. COMPARISON:  12/04/2017 and previous. FINDINGS: Brain: Small area of post treatment hemorrhage in the left insular region continues to diminish, measuring 8 by 14 mm today. No progressive or new hemorrhage. Subacute infarction in the left insular and posterior frontal region appears the same. Evidence of increased swelling. No shift. No other acute infarction. Atrophy and chronic small-vessel ischemic changes elsewhere appear the same. Vascular: There is atherosclerotic calcification of the major vessels at the base of the brain. Skull: Negative Sinuses/Orbits: Clear sinuses. Old orbital blowout fracture on the right. Other: None IMPRESSION: No worsening or progressive findings. Diminishing conspicuity of an 8 by 14 mm region of post treatment hemorrhage in the left insula. No evidence of infarct extension. Electronically Signed   By: Nelson Chimes M.D.   On:  12/07/2017 06:38    ekg- 12-03-17 paced rhythm  Echo  12-04-17 - Left ventricle: The cavity size was below normal. Wall thickness   was increased in a pattern of mild LVH. Systolic function was   vigorous. The estimated ejection fraction was in the range of 65%   to 70%. Wall motion was normal; there were no regional wall   motion abnormalities. Doppler parameters are consistent with   abnormal left ventricular relaxation (grade 1 diastolic   dysfunction). Doppler parameters are consistent with high   ventricular filling pressure. - Ventricular septum: The contour showed diastolic flattening and   systolic flattening. These changes are consistent with RV volume   and pressure overload. - Aortic valve: Mildly calcified annulus. Trileaflet. - Mitral valve: Severely calcified annulus. Severely calcified   leaflets . The findings are consistent with mild stenosis. Mean   gradient (D): 4 mm Hg. Valve area by pressure half-time: 2.12   cm^2. Valve area by continuity equation (using LVOT flow): 0.81   cm^2. - Right ventricle: The cavity size was severely dilated. Pacer wire   or catheter noted in right ventricle. Systolic function was   reduced. - Right atrium: The atrium was severely dilated. Pacer wire or   catheter noted in right atrium. - Tricuspid valve: There was severe regurgitation. - Pulmonic valve: There was mild regurgitation. - Pulmonary arteries: Systolic pressure was severely increased. PA   peak pressure: 71 mm Hg (S). - Inferior vena cava: The CVP could not accurately be assessed as   the patient was ventilated (via IVC assessment). - Pericardium, extracardiac: A moderate to large size pericardial   effusion was noted. Features were not consistent with tamponade   physiology.   STUDIES:  CT Head 5/5 > persistent hyperdensity at left insula, hemorrhage is favored.  No new acute process. CT chest 5/5 > bilateral pleural effusions R > L, mod pericardial effusion, cirrhosis,  ascites. Echo 5/5 > EF 65-70%, G1DD, RV overload, mod to large pericardial effusion without tamponade physiology CT head 5/8>>> No worsening or progressive findings. Diminishing conspicuity of an 8 by 14 mm region of post treatment hemorrhage in the left insula. No evidence of infarct extension.  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  S/p thoracentesis.  Self extubated 5/7.    PULMONARY A: Acute  hypoxemic respiratory failure -- secondary to bilateral large pleural effusions secondary to acute on chronic right-sided heart failure and cirrhosis.  Self extubated 5/7, tolerating well. Bilateral pleural effusions - s/p R and L thoracentesis 4/29 and 4/30 Acute pulm edema - due to above. Improved.  Hx of pulm sarcoid   P:   Mobilize  pulm hygiene  Continue diuresis - lasix BID and spironolactone  Avoid further thora for now given almost certain recurrence  Follow CXR intermittently.  CARDIOVASCULAR A:  Acute diastolic CHF preserved EF Acute pulm edema Paroxysmal afib off anticoagulation  Moderate pericardial effusion - no tamponade  P:  Continue diuresis as above  No anticoagulation at this time due to recent GI bleed ASA on hold, continue to hold until cleared by neurology (can likely resume given stable f/u head CT 5/8)  Off pressors  Monitor pericardial effusion   RENAL A:   AKI secondary to cardiorenal - stable  Hyponatremia secondary to volume overload - resolved. P:   Continue with IV diuresis.  Monitor urine output. 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  Neurology following. Antiplatelet therapy as per neurology.   Can tx tele.  Will ask TRH to assume care 5/9   No family at bedside 5/8   Nickolas Madrid, NP 12/07/2017  9:57 AM Pager: [phone] or [phone]  ^^^^^^^^^^ P CCM Attending Note I have seen and examined the patient. Agree with APP's Assessment and plan. Please see my comments as follows.  76 y/o female  with  Cardiomyopathy, PHT, A fib, PPM, h/o Sarcoidosis, Cor Pulmonale, recent GI bleed, off AC, recent M2M3 occlusion, cvca. 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 Diminishing conspicuity of an 8 by 14 mm region of post treatment hemorrhage in the left insula.No evidence of infarct extension. Neuro f/u reg CVA management; not candidate for [facility] at present; Continue  Po Diuresis.  Monitor K.  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 GI input reg cirrhosis Rx.  Monitor renal fxn. Cardiology  input reg. Pericardial effusion. Supportive care. Prognosis guarded.  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   5/6 POCUS- pericardial effusion (poor views), bilateral R>L  pleural effusions.   Spoke to daughterLannette Donath Difatta-5/8 at bedside, explained conditions, prognosis, encouraged to consider discussing advance directives with patient to have a plan for the future, due to multiple comorbidities.  Madaline Brilliant to transfer to tele bed>hospitalist service; PCCM signing off once pt out of ICU. Pls reconsult if needed; thanks.  Thank you for letting me participate in the care of your patient. ^^^^^^^^^^ I  Have personally spent   45 Minutes  In the care of this Patient providing Critical care Services; Time includes review of chart, labs, imaging, coordinating care with other physicians and healthcare team members. Also includes time for frequent reevaluation and additional treatment implementation due to change in clinical condiiton of patient. Excludes time spent for Procedure and Teaching.  ^^^^^^^^^^ Note subject to typographical and grammatical errors;   Any formal questions or concerns about the content, text, or information contained within the body of this dictation should be directly addressed to the physician  for  clarification.  Evans Lance, MD Pulmonary and [facility] Pager: [phone]

## 2017-12-07 NOTE — Progress Notes (Signed)
Pt arrived to unit

## 2017-12-07 NOTE — Progress Notes (Signed)
Nutrition Follow-up  DOCUMENTATION CODES:   Not applicable  INTERVENTION:  Ensure Enlive po BID, each supplement provides 350 kcal and 20 grams of protein  NUTRITION DIAGNOSIS:   Inadequate oral intake related to lethargy/confusion as evidenced by meal completion < 25%.  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   Pt self-extubated 12/06/17. According to chart pt only eating 15% of meals. Upon dietetic intern visit pt seemed confused. Pt family at bedside.   Family recalls that pt will typically eat " normal portions and food when she is not feeling sick" and that she has had some fluctuations in weight due to fluid changes. Family is not sure if her lack of consumption is due to appetite/ pt status or dislike of the food. Pt and family open to supplementation, pt requests chocolate.   Medications reviewed: lasix, novolog 0-15 units, protonix, potassium chloride, aldactone.   Labs reviewed: BG 169 (H), BUN 25 (H), creatinine 1.36 (H), GFR 37 (L), RBC 3.66 (L), hemoglobin 8 (L), HCT 25.5 (L).   Diet Order:   Diet Order           Diet Carb Modified Fluid consistency: Thin; Room service appropriate? Yes  Diet effective now         EDUCATION NEEDS:   No education needs have been identified at this time  Skin:  Skin Assessment: Reviewed RN Assessment  Last BM:  unknown  Height:   Ht Readings from Last 1  Encounters:  12/03/17 5\' 6"  (1.676 m)    Weight:   Wt Readings from Last 1 Encounters:  12/06/17 181 lb 7 oz (82.3 kg)    Ideal Body Weight:  59.09 kg  BMI:  Body mass index is 29.28 kg/m.  Estimated Nutritional Needs:   Kcal:  1650-1850 kcal  Protein:  100-120 grams  Fluid:  >2 L/day   Hope Budds, Dietetic Intern

## 2017-12-08 DIAGNOSIS — D649 Anemia, unspecified: Secondary | ICD-10-CM

## 2017-12-08 DIAGNOSIS — I6602 Occlusion and stenosis of left middle cerebral artery: Secondary | ICD-10-CM

## 2017-12-08 DIAGNOSIS — I63312 Cerebral infarction due to thrombosis of left middle cerebral artery: Secondary | ICD-10-CM

## 2017-12-08 DIAGNOSIS — I272 Pulmonary hypertension, unspecified: Secondary | ICD-10-CM

## 2017-12-08 DIAGNOSIS — D869 Sarcoidosis, unspecified: Secondary | ICD-10-CM

## 2017-12-08 DIAGNOSIS — K7031 Alcoholic cirrhosis of liver with ascites: Secondary | ICD-10-CM

## 2017-12-08 DIAGNOSIS — R791 Abnormal coagulation profile: Secondary | ICD-10-CM

## 2017-12-08 LAB — GLUCOSE, CAPILLARY
GLUCOSE-CAPILLARY: 203 mg/dL — AB (ref 65–99)
GLUCOSE-CAPILLARY: 156 mg/dL — AB (ref 65–99)
Glucose-Capillary: 112 mg/dL — ABNORMAL HIGH (ref 65–99)
Glucose-Capillary: 144 mg/dL — ABNORMAL HIGH (ref 65–99)

## 2017-12-08 LAB — CBC
HCT: 28.5 % — ABNORMAL LOW (ref 36.0–46.0)
HEMOGLOBIN: 9.2 g/dL — AB (ref 12.0–15.0)
MCH: 22.5 pg — AB (ref 26.0–34.0)
MCHC: 32.3 g/dL (ref 30.0–36.0)
MCV: 69.9 fL — AB (ref 78.0–100.0)
PLATELETS: 315 10*3/uL (ref 150–400)
RBC: 4.08 MIL/uL (ref 3.87–5.11)
RDW: 19.6 % — ABNORMAL HIGH (ref 11.5–15.5)
WBC: 8.3 10*3/uL (ref 4.0–10.5)

## 2017-12-08 LAB — BASIC METABOLIC PANEL
Anion gap: 13 (ref 5–15)
BUN: 30 mg/dL — AB (ref 6–20)
CHLORIDE: 103 mmol/L (ref 101–111)
CO2: 22 mmol/L (ref 22–32)
Calcium: 9.1 mg/dL (ref 8.9–10.3)
Creatinine, Ser: 1.53 mg/dL — ABNORMAL HIGH (ref 0.44–1.00)
GFR calc Af Amer: 37 mL/min — ABNORMAL LOW (ref >60)
GFR calc non Af Amer: 32 mL/min — ABNORMAL LOW (ref >60)
GLUCOSE: 160 mg/dL — AB (ref 65–99)
Potassium: 4.7 mmol/L (ref 3.5–5.1)
Sodium: 138 mmol/L (ref 135–145)

## 2017-12-08 MED ORDER — FUROSEMIDE 40 MG PO TABS
40.0000 mg | ORAL_TABLET | Freq: Every day | ORAL | Status: DC
  Administered 2017-12-09 – 2017-12-13 (×5): 40 mg via ORAL
  Filled 2017-12-08 (×6): qty 1

## 2017-12-08 MED ORDER — ENSURE ENLIVE PO LIQD
237.0000 mL | Freq: Two times a day (BID) | ORAL | Status: DC
  Administered 2017-12-09 – 2017-12-13 (×8): 237 mL via ORAL

## 2017-12-08 MED ORDER — BISACODYL 10 MG RE SUPP
10.0000 mg | Freq: Once | RECTAL | Status: AC
  Administered 2017-12-08: 10 mg via RECTAL
  Filled 2017-12-08: qty 1

## 2017-12-08 NOTE — Progress Notes (Signed)
Physical Therapy Treatment Patient Details Name: Ana Gardner MRN: [id number] DOB: 1940/12/30 Today's Date: 12/08/2017    History of Present Illness 77 y.o. 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 No tPA given due to recent GI bleeding.  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    PT Comments    Patient emotional during session and perseverating about where her daughter is and at end of session wanting to call her however could not operate the phone-NT notified to assist with phone call. Patient agreeable to ambulation in room this session. Pt may be more agreeable to increased mobility if daughter is present? Continue to progress as tolerated.   Follow Up Recommendations  CIR     Equipment Recommendations  Other (comment)(TBA)    Recommendations for Other Services Rehab consult     Precautions / Restrictions Precautions Precautions: Fall    Mobility  Bed Mobility Overal bed mobility: Needs Assistance Bed Mobility: Supine to Sit Rolling: Min assist         General bed mobility comments: assist to elevate trunk into sitting; cues for sequencing and use of rail  Transfers Overall transfer level: Needs assistance Equipment used: Rolling walker (2 wheeled) Transfers: Sit to/from Stand Sit to Stand: Min assist;+2 safety/equipment;From elevated surface         General transfer comment: cues for safe hand placement and assist to power up into standing  Ambulation/Gait Ambulation/Gait assistance: Min assist Ambulation Distance (Feet): (~30ft around bed) Assistive device: Rolling walker (2 wheeled) Gait Pattern/deviations:  Step-through pattern;Trunk flexed     General Gait Details: cues for safe use of AD and posture; pt required assistance to safely manuever RW around bed and sat in chair very abruptly   Stairs             Wheelchair Mobility    Modified Rankin (Stroke Patients Only) Modified Rankin (Stroke Patients Only) Pre-Morbid Rankin Score: No symptoms Modified Rankin: Moderately severe disability     Balance Overall balance assessment: Needs assistance Sitting-balance support: Feet supported Sitting balance-Leahy Scale: Fair     Standing balance support: During functional activity;Bilateral upper extremity supported Standing balance-Leahy Scale: Poor                              Cognition Arousal/Alertness: Awake/alert Behavior During Therapy: (frequent changes in emotion) Overall Cognitive Status: Impaired/Different from baseline Area of Impairment: Orientation;Attention;Memory;Following commands;Safety/judgement;Awareness;Problem solving                 Orientation Level: Disoriented to;Time;Situation Current Attention Level: Focused Memory: Decreased short-term memory Following Commands: Follows one step commands inconsistently;Follows one step commands with increased time Safety/Judgement: Decreased awareness of safety;Decreased awareness of deficits   Problem Solving: Slow processing;Decreased initiation;Difficulty sequencing;Requires verbal cues General Comments: perseverating about where daughter is and easily agitated when asked to mobilize      Exercises      General Comments        Pertinent Vitals/Pain Pain Assessment: No/denies pain    Home Living                      Prior Function  PT Goals (current goals can now be found in the care plan section) Acute Rehab PT Goals PT Goal Formulation: With family Time For Goal Achievement: 12/18/17 Potential to Achieve Goals: Good Progress towards PT goals: Progressing  toward goals    Frequency    Min 4X/week      PT Plan Current plan remains appropriate    Co-evaluation              AM-PAC PT "6 Clicks" Daily Activity  Outcome Measure  Difficulty turning over in bed (including adjusting bedclothes, sheets and blankets)?: Unable Difficulty moving from lying on back to sitting on the side of the bed? : Unable Difficulty sitting down on and standing up from a chair with arms (e.g., wheelchair, bedside commode, etc,.)?: Unable Help needed moving to and from a bed to chair (including a wheelchair)?: A Little Help needed walking in hospital room?: A Little Help needed climbing 3-5 steps with a railing? : A Lot 6 Click Score: 11    End of Session Equipment Utilized During Treatment: Oxygen;Gait belt Activity Tolerance: Patient tolerated treatment well Patient left: in chair;with call bell/phone within reach;with chair alarm set Nurse Communication: Mobility status PT Visit Diagnosis: Other symptoms and signs involving the nervous system (R29.898)     Time: 1009-1030 PT Time Calculation (min) (ACUTE ONLY): 21 min  Charges:  $Gait Training: 8-22 mins                    G Codes:      Earney Navy, PTA Pager: [phone]     Darliss Cheney 12/08/2017, 3:16 PM

## 2017-12-08 NOTE — Progress Notes (Signed)
PROGRESS NOTE    Ana Gardner  ZOX:[id number] DOB: 10-12-1940 DOA: 12/03/2017 PCP: Karleen Hampshire., MD    Brief Narrative:  77 year old female who presented with right-sided weakness.  She does have a significant past medical history of sarcoidosis, right-sided heart failure, Mobitz 2 AV block, chronic atrial fibrillation, type 2 diabetes mellitus, dyslipidemia, chronic kidney disease stage III, hypertension and nonalcoholic liver cirrhosis.  Patient developed acute aphasia, with right-sided hemiplegia and left gaze preference, while being and [facility] emergency department.  She initially presented to the emergency room due to worsening dyspnea and edema, apparently she had a recent hospitalization for decompensated heart and liver failure, that required thoracentesis and aggressive diuresis.   Patient was unable to protect her airway, and she was placed on invasive mechanical ventilation.  CT angiography showed proximal posterior division left M2/M3 and left vertebral artery occlusion.  Patient underwent revascularization of the left MCA.   On her initial physical examination blood pressure was 128/84, heart rate 88, temperature 98, respiratory 22, oxygen saturation 99% on invasive mechanical ventilation.  Moist mucous membranes, lungs clear to auscultation bilaterally, heart S1-S2 present and rhythmic, 3-6 systolic ejection murmur, abdomen was soft nontender, 1+ bilateral lower extremity edema.  Sodium 122, potassium 4.2, chloride 94, bicarb 31, glucose 137, BUN 51, creatinine 2.0, BNP 381, white count 7.1, hemoglobin 10.1, hematocrit 28.6, platelets 432.  Urinalysis negative for infection.  Urine drug screen positive for benzodiazepines.  Her chest x-ray showed a large right pleural effusion.  EKG showed atrial pacing, V sensing, right bundle branch block. Positive delayed between a paced and ventricular response, rate 82 bmp.   Patient was admitted to the ICU due to acute ischemic CVA complicated by  acute hypoxemic respiratory failure.   Assessment & Plan:   Active Problems:   Stroke [facility])   Acute CVA (cerebrovascular accident) ([place])   Stroke (cerebrum) (HCC)   Pleural effusion, bilateral   Middle cerebral artery embolism, left   Paroxysmal atrial fibrillation (HCC)   Anemia   Elevated INR   Alcoholic cirrhosis of liver with ascites ([place])   1. Acute ischemic CVA/ left MCA infarct/ embolic, complicated with hemorrhagic conversion at the left insular. Patient tolerating well aspirin, neurologically stable. Will continue neuro checks and physical therapy evaluation. Patient may need snf at discharge. CT angiography with left MCA occlusions, left vertebral artery occulusion. Echocardiogram with LV EF 65 to 70%.   2. Acute hypoxic respiratory failure due to right sided heart failure acute on chronic, severe pulmonary hypertension,complicated by right side pleural effusion. (self extubation 5/7). Patient is off mechanical ventilation, self extubated, still has a large right pleural effusion despite diuresis. Fluid balance negative - 863 ml since admission. Echocardiogram with severe RV dilatation, and significant reduction in systolic function, interventricular septum flattening in systole and diastole, peak PA pressure is 71 mmHG. Patient is pre load dependent. Will order US guided thoracentesis. Poor prognosis.   3. Atrial fibrillation chronic. Pacer rhythm, controlled rate. Not on anticoagulation due to gastrointestinal bleed in the past. Continue aspirin for now.   4. Alcoholic liver cirrhosis. More than likely also right heart failure as the culprit of liver failure. No clinical signs of encephalopathy, will continue close monitoring. INR at 1,5. No elevation in liver enzymes or bilirubins.   5. AKI with hyponatremia. Worsening renal function, patient is pre-load dependent. Will decrease dose of furosemide, discontinue aldactone due to risk for hyperkalemia, K today at 4,7. Serum  bicarbonate at 22.  NA improved  at 138,  6. T2DM. Will continue glucose cover and monitoring with insulin sliding scale, capillary glucose 159, 156, 132, 112, 144.     DVT prophylaxis: enoxaparin   Code Status:  full Family Communication: I spoke with patient's daughter at the bedside and all questions were addressed.  Disposition Plan: may need snf when stable.    Consultants:   Neurology   Procedures:     Antimicrobials:       Subjective: Patient continue to have dyspnea, moderate in intensity, no chest pain, worse with movement. No nausea or vomiting.   Objective: Vitals:   12/07/17 2328 12/08/17 0400 12/08/17 0744 12/08/17 1125  BP: 115/76 118/75 134/61 (!) 144/69  Pulse: 79 82 79 79  Resp: _0 Temp: (!) 97.5 F (36.4 C) 97.7 F (36.5 C)    TempSrc: Oral Oral    SpO2: 100% 100% 96% 97%  Weight:  84.1 kg (185 lb 6.5 oz)    Height:        Intake/Output Summary (Last 24 hours) at 12/08/2017 1212 Last data filed at 12/08/2017 0000 Gross per 24 hour  Intake 270 ml  Output 400 ml  Net -130 ml   Filed Weights   12/04/17 1742 12/06/17 0500 12/08/17 0400  Weight: 88.3 kg (194 lb 10.7 oz) 82.3 kg (181 lb 7 oz) 84.1 kg (185 lb 6.5 oz)    Examination:   General: deconditioned and ill looking appearing Neurology: Awake and alert, non focal  E ENT: no pallor, no icterus, oral mucosa moist Cardiovascular: No JVD. S1-S2 present, rhythmic, no gallops, rubs, or murmurs. No lower extremity edema. Pulmonary: decreased breath sounds bilaterally at bases, more right than left, poor air movement, positive expiratory wheezing, no rhonchi or rales. Gastrointestinal. Abdomen protuberant with no organomegaly, non tender, no rebound or guarding Skin. No rashes Musculoskeletal: no joint deformities     Data Reviewed: I have personally reviewed following labs and imaging studies  CBC: Recent Labs  Lab 12/03/17 0909 12/03/17 1139 12/04/17 0417 12/06/17 0431  12/07/17 0334  WBC 7.1  --  19.0* 16.1* 10.1  NEUTROABS 4.5  --  15.9*  --   --   HGB 10.1* 11.2* 9.1* 8.3* 8.0*  HCT 28.6* 33.0* 28.1* 26.1* 25.5*  MCV 68.1*  --  69.7* 69.6* 69.7*  PLT 432*  --  408* 313 015   Basic Metabolic Panel: Recent Labs  Lab 12/04/17 0417 12/04/17 2046 12/05/17 0450 12/05/17 1854 12/06/17 0431 12/07/17 0334  NA 133* 133* 134*  --  136 139  K 4.0 3.4* 3.6  --  4.2 4.5  CL 99* 101 101  --  103 106  CO2 _1 --  24 26  GLUCOSE 177* 174* 163*  --  225* 169*  BUN 39* 29* 26*  --  24* 25*  CREATININE 1.98* 1.55* 1.43*  --  1.32* 1.36*  CALCIUM 7.9* 8.1* 8.2*  --  8.4* 8.7*  MG  --   --  1.8 1.8 2.0 1.9  PHOS  --   --   --  3.5 3.7 3.6   GFR: Estimated Creatinine Clearance: 39 mL/min (A) (by C-G formula based on SCr of 1.36 mg/dL (H)). Liver Function Tests: Recent Labs  Lab 12/03/17 0909  AST 19  ALT 10*  ALKPHOS 48  BILITOT 1.0  PROT 5.8*  ALBUMIN 2.8*   No results for input(s): LIPASE, AMYLASE in the last 168 hours. No results for input(s): AMMONIA in the  last 168 hours. Coagulation Profile: Recent Labs  Lab 12/04/17 2046 12/05/17 0450 12/06/17 0431 12/07/17 0334  INR 1.85 1.76 1.53 1.51   Cardiac Enzymes: Recent Labs  Lab 12/03/17 0909  TROPONINI <0.03   BNP (last 3 results) No results for input(s): PROBNP in the last 8760 hours. HbA1C: No results for input(s): HGBA1C in the last 72 hours. CBG: Recent Labs  Lab 12/07/17 0815 12/07/17 1135 12/07/17 1703 12/08/17 0625 12/08/17 1138  GLUCAP 159* 156* 132* 112* 144*   Lipid Profile: No results for input(s): CHOL, HDL, LDLCALC, TRIG, CHOLHDL, LDLDIRECT in the last 72 hours. Thyroid Function Tests: No results for input(s): TSH, T4TOTAL, FREET4, T3FREE, THYROIDAB in the last 72 hours. Anemia Panel: No results for input(s): VITAMINB12, FOLATE, FERRITIN, TIBC, IRON, RETICCTPCT in the last 72 hours.    Radiology Studies: I have reviewed all of the imaging during this  hospital visit personally     Scheduled Meds: . aspirin EC  81 mg Oral Daily  . bisacodyl  10 mg Rectal Once  . chlorhexidine gluconate (MEDLINE KIT)  15 mL Mouth Rinse BID  . furosemide  40 mg Oral BID  . insulin aspart  0-15 Units Subcutaneous TID WC  . mouth rinse  15 mL Mouth Rinse BID  . pantoprazole  40 mg Oral Daily  . sodium chloride flush  10-40 mL Intracatheter Q12H  . spironolactone  100 mg Oral Daily   Continuous Infusions: . sodium chloride Stopped (12/07/17 0600)  . sodium chloride       LOS: 5 days        Tawni Millers, MD [facility] Pager [phone]

## 2017-12-08 NOTE — Care Management Important Message (Signed)
Important Message  Patient Details  Name: Ana Gardner MRN: [id number] Date of Birth: 01-Oct-1940   Medicare Important Message Given:  Yes    Orbie Pyo 12/08/2017, 2:09 PM

## 2017-12-08 NOTE — Consult Note (Signed)
Physical Medicine and Rehabilitation Consult Reason for Consult: Aphasia with right-sided weakness Referring Physician: [facility]   HPI: Ana Gardner is a 77 y.o. 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  Per report patient lives alone independent prior to admission.  One level apartment.  Patient with recent admission 11/25/2017 to 11/29/2017 for bilateral pleural effusions as well as congestive heart failure and cirrhosis of liver.  Underwent a thoracentesis 11/28/2017 with 1 L of pleural fluid removed and repeated 11/29/2017 with additional 650 mL obtained.  Patient was started on torsemide as well as metolazone.  Presented 12/03/2017 with aphasia and dense right sided weakness.  CT of the head showed infarcts involving the high right parietal lobe and left frontal operculum appeared to be remote as well as remote lacunar infarcts of the left caudate head and left cerebellum.  CT angiogram of head and neck showed proximal posterior division left M2 occlusion.  Occluded left vertebral artery with minimal reconstitution in the neck.  Patient did not receive TPA.  Patient did require intubation.  Echocardiogram with ejection fraction of 41% grade 1 diastolic dysfunction with no regional wall motion abnormalities.  Interventional radiology consulted patient underwent revascularization of occluded superior division left MCA.  Follow-up cranial CT scan hemorrhagic conversion post procedure with close monitoring of follow-up CT and aspirin later initiated 12/07/2017 for CVA prophylaxis.  Patient had been extubated.  Acute on chronic anemia 8.0 and monitored.  Diet has been advanced to a regular consistency.  Physical and occupational therapy  evaluations completed with recommendations of physical medicine rehab consult.   Review of Systems  Constitutional: Negative for chills and fever.  HENT: Negative for hearing loss.   Eyes: Negative for blurred vision and double vision.  Respiratory: Positive for cough and shortness of breath.   Cardiovascular: Positive for leg swelling.  Gastrointestinal: Positive for constipation. Negative for nausea and vomiting.  Genitourinary: Negative for flank pain and hematuria.  Musculoskeletal: Positive for myalgias.  Skin: Negative for rash.  Neurological: Positive for speech change and focal weakness.  All other systems reviewed and are negative.  Past Medical History:  Diagnosis Date  . Cancer (HCC)    B/L  . CHF (congestive heart failure) ([place])   . Diabetes mellitus without complication ([place])   . Sarcoidosis    Past Surgical History:  Procedure Laterality Date  . IR CT HEAD LTD  12/03/2017  . IR PERCUTANEOUS ART THROMBECTOMY/INFUSION INTRACRANIAL INC DIAG ANGIO  12/03/2017  . MASTECTOMY Left 2004  . PACEMAKER IMPLANT    . RADIOLOGY WITH ANESTHESIA N/A 12/03/2017   Procedure: CODE STROKE;  Surgeon: Luanne Bras, MD;  Location: [facility];  Service: Radiology;  Laterality: N/A;   History reviewed. No pertinent family history. Social History:  reports that she has never smoked. She has never used smokeless tobacco. She reports that she does not drink alcohol or use drugs. Allergies: Not on File Medications Prior to Admission  Medication Sig Dispense Refill  . amiodarone (PACERONE) 200 MG tablet Take 200 mg by mouth daily.   2  . ELIQUIS 5 MG TABS tablet Take 5 mg by mouth 2 (two) times daily.   6  . metolazone (ZAROXOLYN) 2.5 MG tablet Take 2.5 mg by mouth. Twice weekly  2  .  metoprolol succinate (TOPROL-XL) 25 MG 24 hr tablet Take 25 mg by mouth daily.    . pantoprazole (PROTONIX) 40 MG tablet Take 40 mg by mouth daily.   0  . torsemide (DEMADEX) 20 MG tablet Take 20 mg by mouth  daily.   0    Home: Home Living Family/patient expects to be discharged to:: Private residence Living Arrangements: Alone Available Help at Discharge: Available PRN/intermittently Type of Home: Apartment Home Access: Level entry [place]: One level Bathroom Shower/Tub: Chiropodist: [place]: None Additional Comments: history taken from previous history  Functional History: Prior Function Level of Independence: Independent Functional Status:  Mobility: Bed Mobility Overal bed mobility: Needs Assistance Bed Mobility: Supine to Sit Rolling: Min assist Sidelying to sit: Mod assist, +2 for physical assistance, +2 for safety/equipment Sit to supine: Mod assist, +2 for safety/equipment General bed mobility comments: cues coming up and forward to EOB.  pt needed extra cuing to push with hands to scoot. Transfers Overall transfer level: Needs assistance Equipment used: 2 person hand held assist Transfers: Sit to/from Stand, Stand Pivot Transfers Sit to Stand: Min assist, +2 safety/equipment Stand pivot transfers: Min assist, +2 safety/equipment General transfer comment: cues for hand placement and stability assist.  Assist for guidance and redirection. Ambulation/Gait Ambulation/Gait assistance: Min assist, +2 safety/equipment Ambulation Distance (Feet): 10 Feet Assistive device: 1 person hand held assist General Gait Details: pt was guarded, but generally steady with short distance ambulation to sink.  pt should be able to do much more, but her stomach was hurting.    ADL: ADL Overall ADL's : Needs assistance/impaired Eating/Feeding: NPO Grooming: Wash/dry hands, Standing, Min guard, Brushing hair, Cueing for sequencing, Cueing for safety, Sitting Grooming Details (indicate cue type and reason): Pt combing her hair at EOB using LUE and demonstrating WFL grasp. Requiring cues for ocmpleting task and brushing right side. Pt with poor attention  and requiring cues to complete task. Pt requiring Min Guard A for safety while standing at sink.  Upper Body Bathing: Total assistance, Bed level Lower Body Bathing: Total assistance, Sit to/from stand Upper Body Dressing : Total assistance, Sitting Lower Body Dressing: Total assistance, Sit to/from stand Toilet Transfer: Moderate assistance, +2 for physical assistance, +2 for safety/equipment, Stand-pivot, BSC Toileting- Clothing Manipulation and Hygiene: Total assistance, Sit to/from stand Functional mobility during ADLs: Minimal assistance, +2 for safety/equipment, Cueing for sequencing General ADL Comments: Pt with decreased attention, problem solving, and awareness. Pt requiring increased cues and encouragement to complete task fully.   Cognition: Cognition Overall Cognitive Status: Difficult to assess(not tested formally) Orientation Level: Oriented to person, Oriented to place Cognition Arousal/Alertness: Awake/alert Behavior During Therapy: WFL for tasks assessed/performed Overall Cognitive Status: Difficult to assess(not tested formally) Area of Impairment: Orientation, Attention, Memory, Following commands, Safety/judgement, Awareness, Problem solving Orientation Level: Person, Place(Nods head appropriately to location and name) Current Attention Level: Focused, Sustained Following Commands: Follows one step commands inconsistently, Follows one step commands with increased time Safety/Judgement: Decreased awareness of safety, Decreased awareness of deficits Problem Solving: Slow processing, Decreased initiation, Difficulty sequencing, Requires verbal cues, Requires tactile cues General Comments: cuing for encouragement, redirection, attension Difficult to assess due to: Impaired communication, Intubated  Blood pressure 118/75, pulse 82, temperature 97.7 F (36.5 C), temperature source Oral, resp. rate 18, height 5\' 6"  (1.676 m), weight 84.1 kg (185 lb 6.5 oz), SpO2 100  %. Physical Exam  Constitutional: She appears well-developed. No distress.  HENT:  Head: Normocephalic.  Eyes: Pupils  are equal, round, and reactive to light.  Neck: No thyromegaly present.  Cardiovascular: Normal rate.  Respiratory: Effort normal.  GI: Soft.  Neurological:  Pt alert. Oriented to month/year and self. Follows simple commands with some delay. +apraxia. Language fairly fluent. Mild right central 7. RUE 4/5 prox to distal. RLE 4/5 prox to distal. LUE and LLE 4+ to 5/5. No sensory abnl seen  Psychiatric:  flat   Neurological.  Limited spontaneous speech.  She can provide her name and place.  Has some difficulty naming objects    Results for orders placed or performed during the hospital encounter of 12/03/17 (from the past 24 hour(s))  Glucose, capillary     Status: Abnormal   Collection Time: 12/07/17  8:15 AM  Result Value Ref Range   Glucose-Capillary 159 (H) 65 - 99 mg/dL  Glucose, capillary     Status: Abnormal   Collection Time: 12/07/17 11:35 AM  Result Value Ref Range   Glucose-Capillary 156 (H) 65 - 99 mg/dL  Glucose, capillary     Status: Abnormal   Collection Time: 12/07/17  5:03 PM  Result Value Ref Range   Glucose-Capillary 132 (H) 65 - 99 mg/dL   Comment 1 Notify RN    Comment 2 Document in Chart   Glucose, capillary     Status: Abnormal   Collection Time: 12/08/17  6:25 AM  Result Value Ref Range   Glucose-Capillary 112 (H) 65 - 99 mg/dL   Comment 1 Notify RN    Comment 2 Document in Chart    Ct Head Wo Contrast  Result Date: 12/07/2017 CLINICAL DATA:  Followup intracranial hemorrhage EXAM: CT HEAD WITHOUT CONTRAST TECHNIQUE: Contiguous axial images were obtained from the base of the skull through the vertex without intravenous contrast. COMPARISON:  12/04/2017 and previous. FINDINGS: Brain: Small area of post treatment hemorrhage in the left insular region continues to diminish, measuring 8 by 14 mm today. No progressive or new hemorrhage.  Subacute infarction in the left insular and posterior frontal region appears the same. Evidence of increased swelling. No shift. No other acute infarction. Atrophy and chronic small-vessel ischemic changes elsewhere appear the same. Vascular: There is atherosclerotic calcification of the major vessels at the base of the brain. Skull: Negative Sinuses/Orbits: Clear sinuses. Old orbital blowout fracture on the right. Other: None IMPRESSION: No worsening or progressive findings. Diminishing conspicuity of an 8 by 14 mm region of post treatment hemorrhage in the left insula. No evidence of infarct extension. Electronically Signed   By: Nelson Chimes M.D.   On: 12/07/2017 06:38     Assessment/Plan: Diagnosis: left MCA infarct 1. Does the need for close, 24 hr/day medical supervision in concert with the patient's rehab needs make it unreasonable for this patient to be served in a less intensive setting? Yes 2. Co-Morbidities requiring supervision/potential complications: PAF, chf, post-stroke sequelae 3. Due to bladder management, bowel management, safety, skin/wound care, disease management, medication administration, pain management and patient education, does the patient require 24 hr/day rehab nursing? Yes 4. Does the patient require coordinated care of a physician, rehab nurse, PT (1-2 hrs/day, 5 days/week), OT (1-2 hrs/day, 5 days/week) and SLP (1-2 hrs/day, 5 days/week) to address physical and functional deficits in the context of the above medical diagnosis(es)? Yes Addressing deficits in the following areas: balance, endurance, locomotion, strength, transferring, bowel/bladder control, bathing, dressing, feeding, grooming, toileting, cognition, language and psychosocial support 5. Can the patient actively participate in an intensive therapy program of at least 3  hrs of therapy per day at least 5 days per week? Yes 6. The potential for patient to make measurable gains while on inpatient rehab is  excellent 7. Anticipated functional outcomes upon discharge from inpatient rehab are supervision  with PT, supervision with OT, supervision with SLP. 8. Estimated rehab length of stay to reach the above functional goals is: 15-21 days 9. Anticipated D/C setting: Home 10. Anticipated post D/C treatments: HH therapy and Outpatient therapy 11. Overall Rehab/Functional Prognosis: excellent  RECOMMENDATIONS: This patient's condition is appropriate for continued rehabilitative care in the following setting: CIR Patient has agreed to participate in recommended program. Yes Note that insurance prior authorization may be required for reimbursement for recommended care.  Comment: Rehab Admissions Coordinator to follow up.  Thanks,  Meredith Staggers, MD, Mellody Drown  I have personally performed a face to face diagnostic evaluation of this patient. Additionally, I have reviewed and concur with the physician assistant's documentation above.     Lavon Paganini Angiulli, PA-C 12/08/2017

## 2017-12-08 NOTE — Progress Notes (Signed)
STROKE TEAM PROGRESS NOTE   SUBJECTIVE (INTERVAL HISTORY) Her CIR coordinator is the bedside.  Pt awake alert and mild respiratory distress on nasal cannular. Sitting in bed for lunch. Speech clear and no significant perseverations. Did not have bowel movement for 3 days, will give dulcolax and then get stool occult blood test.     OBJECTIVE Temp:  [97.5 F (36.4 C)-98 F (36.7 C)] 97.7 F (36.5 C) (05/09 0400) Pulse Rate:  [79-91] 79 (05/09 1125) Cardiac Rhythm: Atrial paced (05/09 0906) Resp:  [16-18] 16 (05/09 1125) BP: (111-144)/(61-77) 144/69 (05/09 1125) SpO2:  [78 %-100 %] 97 % (05/09 1125) Weight:  [185 lb 6.5 oz (84.1 kg)] 185 lb 6.5 oz (84.1 kg) (05/09 0400)  Recent Labs  Lab 12/07/17 0815 12/07/17 1135 12/07/17 1703 12/08/17 0625 12/08/17 1138  GLUCAP 159* 156* 132* 112* 144*   Recent Labs  Lab 12/04/17 0417 12/04/17 2046 12/05/17 0450 12/05/17 1854 12/06/17 0431 12/07/17 0334  NA 133* 133* 134*  --  136 139  K 4.0 3.4* 3.6  --  4.2 4.5  CL 99* 101 101  --  103 106  CO2 23 24 24   --  24 26  GLUCOSE 177* 174* 163*  --  225* 169*  BUN 39* 29* 26*  --  24* 25*  CREATININE 1.98* 1.55* 1.43*  --  1.32* 1.36*  CALCIUM 7.9* 8.1* 8.2*  --  8.4* 8.7*  MG  --   --  1.8 1.8 2.0 1.9  PHOS  --   --   --  3.5 3.7 3.6   Recent Labs  Lab 12/03/17 0909  AST 19  ALT 10*  ALKPHOS 48  BILITOT 1.0  PROT 5.8*  ALBUMIN 2.8*   Recent Labs  Lab 12/03/17 0909 12/03/17 1139 12/04/17 0417 12/06/17 0431 12/07/17 0334  WBC 7.1  --  19.0* 16.1* 10.1  NEUTROABS 4.5  --  15.9*  --   --   HGB 10.1* 11.2* 9.1* 8.3* 8.0*  HCT 28.6* 33.0* 28.1* 26.1* 25.5*  MCV 68.1*  --  69.7* 69.6* 69.7*  PLT 432*  --  408* 313 306   Recent Labs  Lab 12/03/17 0909  TROPONINI <0.03   Recent Labs    12/06/17 0431 12/07/17 0334  LABPROT 18.3* 18.1*  INR 1.53 1.51   No results for input(s): COLORURINE, LABSPEC, PHURINE, GLUCOSEU, HGBUR, BILIRUBINUR, KETONESUR, PROTEINUR,  UROBILINOGEN, NITRITE, LEUKOCYTESUR in the last 72 hours.  Invalid input(s): APPERANCEUR     Component Value Date/Time   CHOL 70 12/04/2017 0417   TRIG 72 12/04/2017 0417   HDL 18 (L) 12/04/2017 0417   CHOLHDL 3.9 12/04/2017 0417   VLDL 14 12/04/2017 0417   LDLCALC 38 12/04/2017 0417   Lab Results  Component Value Date   HGBA1C 8.6 (H) 12/04/2017      Component Value Date/Time   LABOPIA NONE DETECTED 12/03/2017 1726   COCAINSCRNUR NONE DETECTED 12/03/2017 1726   LABBENZ POSITIVE (A) 12/03/2017 1726   AMPHETMU NONE DETECTED 12/03/2017 1726   THCU NONE DETECTED 12/03/2017 1726   LABBARB NONE DETECTED 12/03/2017 1726    Recent Labs  Lab 12/03/17 0909  ETH <10    I have personally reviewed the radiological images below and agree with the radiology interpretations.  Ct Angio Head W Or Wo Contrast  Result Date: 12/03/2017 CLINICAL DATA:  Code stroke.  Acute onset of right-sided weakness. EXAM: CT ANGIOGRAPHY HEAD AND NECK TECHNIQUE: Multidetector CT imaging of the head and neck was performed  using the standard protocol during bolus administration of intravenous contrast. Multiplanar CT image reconstructions and MIPs were obtained to evaluate the vascular anatomy. Carotid stenosis measurements (when applicable) are obtained utilizing NASCET criteria, using the distal internal carotid diameter as the denominator. CONTRAST:  13mL ISOVUE-370 IOPAMIDOL (ISOVUE-370) INJECTION 76% COMPARISON:  CT head without contrast from the same day at 8:46 a.m. at [facility], [place]. FINDINGS: CT HEAD FINDINGS Brain: Repeat noncontrast CT of the head again demonstrates remote infarcts of the left frontal operculum, left caudate head, and high right parietal lobe. Remote posterior left cerebellar infarcts are again seen. Anterior left insular hypoattenuation may be slightly worse than on the prior exam. The precentral gyrus is intact. Vascular: Atherosclerotic calcifications are again noted  within the cavernous internal carotid arteries bilaterally. There is no hyperdense vessel. Skull: Calvarium is intact. No focal lytic or blastic lesions are present. Sinuses: Paranasal sinuses and mastoid air cells are clear. Orbits: Remote blowout fracture is again noted in the right orbit. Right lens replacement is present. Globes and orbits are otherwise within normal limits. Review of the MIP images confirms the above findings CTA NECK FINDINGS Aortic arch: A 3 vessel arch configuration is present. Atherosclerotic calcifications are present at the origins of the great vessels without a significant stenosis. There is no aneurysm. Right carotid system: The right common carotid artery is within normal limits. Atherosclerotic changes are noted in the proximal right ICA. More distal right internal carotid artery is within normal limits. Left carotid system: The left common carotid artery is within normal limits. Atherosclerotic calcifications are present at the carotid bifurcation proximal left ICA without significant stenosis. There is mild tortuosity of the more distal left ICA without significant stenosis. Vertebral arteries: Atherosclerotic calcifications are present at the origin of the right vertebral artery without significant stenosis. The left vertebral artery is occluded at its origin. There is very faint reconstitution at the C2-3 level without intraluminal contrast at the foramen magnum. Skeleton: Rightward curvature is present in the lower cervical spine, compensating for leftward curvature in the upper thoracic spine. Other neck: Heterogeneous thyroid is present without a dominant lesion. Patient is intubated. No significant cervical adenopathy is present. Salivary glands are within normal limits. Upper chest: A large right pleural effusion is again seen. The moderate left-sided pleural effusion is present. There is partial collapse of the right upper lobe. Possible soft tissue fullness is present at  the right hila. Recommend dedicated CT of the chest with contrast as tolerated. Review of the MIP images confirms the above findings CTA HEAD FINDINGS Anterior circulation: Atherosclerotic calcifications are present within the cavernous internal carotid arteries bilaterally. There is no significant luminal stenosis relative to the more distal vessels through the ICA terminus. The M1 segments are intact bilaterally. No significant proximal occlusion is present on the right. There is moderate attenuation of superior and posterior M2 divisions. The left M1 segment is intact. Proximal superior left M2 and M3 segment is occluded or highly stenotic. The posterior left M2 division is not visualized. Posterior circulation: The right vertebral artery feeds the basilar. PICA origin is visualized. The basilar artery is small centrally terminating at the superior cerebellar arteries. Fetal type posterior cerebral arteries are present bilaterally with moderate distal segmental stenoses but no significant proximal stenosis or occlusion. Venous sinuses: Dural sinuses are patent. Straight sinus and deep cerebral veins are intact. Cortical veins are unremarkable. Anatomic variants: Fetal type posterior cerebral arteries bilaterally. Delayed phase: Postcontrast images are not  performed in the acute setting. Review of the MIP images confirms the above findings IMPRESSION: 1. Proximal posterior division left M2 occlusion. 2. More distal anterior division left M2/M3 occlusion. 3. Extensive intracranial small vessel disease. 4. Occluded left vertebral artery with minimal reconstitution in the neck. 5. Fetal type posterior cerebral arteries bilaterally. 6. Atherosclerotic changes at the carotid bifurcations and cavernous internal carotid arteries bilaterally without other significant stenosis. 7. Aortic Atherosclerosis (ICD10-I70.0). Calcifications involve the great vessel origins without significant stenoses. 8. Large bilateral pleural  effusions, right greater than left. 9. Parenchymal volume loss in the right upper lobe likely related to the effusions. Central mass lesion not excluded. 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 Recommend dedicated CT the chest with contrast as the patient's condition allows. 10. These results were called by telephone at the time of interpretation on 12/03/2017 at 11:02 am to Dr. Roland Rack , who verbally acknowledged these results. Electronically Signed   By: San Morelle M.D.   On: 12/03/2017 11:05   Ct Head Wo Contrast  Result Date: 12/07/2017 CLINICAL DATA:  Followup intracranial hemorrhage EXAM: CT HEAD WITHOUT CONTRAST TECHNIQUE: Contiguous axial images were obtained from the base of the skull through the vertex without intravenous contrast. COMPARISON:  12/04/2017 and previous. FINDINGS: Brain: Small area of post treatment hemorrhage in the left insular region continues to diminish, measuring 8 by 14 mm today. No progressive or new hemorrhage. Subacute infarction in the left insular and posterior frontal region appears the same. Evidence of increased swelling. No shift. No other acute infarction. Atrophy and chronic small-vessel ischemic changes elsewhere appear the same. Vascular: There is atherosclerotic calcification of the major vessels at the base of the brain. Skull: Negative Sinuses/Orbits: Clear sinuses. Old orbital blowout fracture on the right. Other: None IMPRESSION: No worsening or progressive findings. Diminishing conspicuity of an 8 by 14 mm region of post treatment hemorrhage in the left insula. No evidence of infarct extension. Electronically Signed   By: Nelson Chimes M.D.   On: 12/07/2017 06:38   Ct Head Wo Contrast  Result Date: 12/04/2017 CLINICAL DATA:  Follow-up exam for acute stroke, status post catheter directed revascularization. EXAM: CT HEAD WITHOUT CONTRAST TECHNIQUE: Contiguous axial images were obtained from the base of the skull through the  vertex without intravenous contrast. COMPARISON:  Prior CT from earlier the same day. FINDINGS: Brain: Previously noted hyperdensity at the level of the left insula again seen, measuring 10 x 12 x 14 mm (transverse by AP by craniocaudad). Overall, this is similar in size with slightly decreased attenuation as compared to previous. Given persistence, hemorrhage is favored, although a degree of contrast staining may be contributory. Evolving acute left MCA territory infarcts within the adjacent all left frontal operculum and posterior frontal region again seen, slightly more apparent on current exam. No mass effect. No other acute intracranial hemorrhage. No other acute large vessel territory infarct. No mass lesion or midline shift. No hydrocephalus. No extra-axial fluid collection. Underlying atrophy with chronic small vessel ischemic changes again noted. Vascular: No new hyperdense vessel. Scattered vascular calcifications noted within the carotid siphons. Skull: No acute scalp soft tissue abnormality.  Calvarium intact. Sinuses/Orbits: Globes and orbital soft tissues within normal limits. Small air-fluid levels noted within the sphenoid sinuses. Scattered mucosal thickening throughout the ethmoidal air cells and maxillary sinuses. Trace right mastoid effusion. Other: None. IMPRESSION: 1. Persistent hyperdensity at the left insula, similar in size measuring up to 14 mm with slightly decreased attenuation. Hemorrhage  is favored. 2. Additional multifocal areas of evolving left MCA territory infarction, stable in distribution as compared to previous. 3. No other new acute intracranial abnormality. Electronically Signed   By: Jeannine Boga M.D.   On: 12/04/2017 14:29   Ct Head Wo Contrast  Result Date: 12/04/2017 CLINICAL DATA:  77 y/o F; stroke post catheter thrombectomy for follow-up. EXAM: CT HEAD WITHOUT CONTRAST TECHNIQUE: Contiguous axial images were obtained from the base of the skull through the  vertex without intravenous contrast. COMPARISON:  11/04/2017 CT head, CTA head, angiogram head. FINDINGS: Brain: Amorphous density within the left anterior insula measuring 10 x 9 x 16 mm (AP x ML x CC series 6, image 17 and series 5, image 46). Hypoattenuation within the left lateral frontal lobe, frontal operculum, and insula. Small chronic infarcts in the posterior left cerebellum, left caudate head, right parietal lobe, and right frontal operculum. No hydrocephalus, extra-axial collection, or effacement of basilar cisterns. Vascular: Calcific atherosclerosis of carotid siphons. Skull: Normal. Negative for fracture or focal lesion. Sinuses/Orbits: Chronic right lamina papyracea fracture. Partial opacification of paranasal sinuses likely due to recent intubation. Other: None. IMPRESSION: 1. Amorphous density in left anterior insula measuring up to 16 mm, probably enhancing infarction, although hemorrhage is possible. Follow-up is recommended to ensure stability. 2. Additional area of hypoattenuation within left lateral frontal lobe, frontal operculum, insula compatible with evolving infarction. 3. Multiple additional stable chronic infarcts as above. These results were called by telephone at the time of interpretation on 12/04/2017 at 5:31 am to Dr. Rory Percy, who verbally acknowledged these results. Electronically Signed   By: Kristine Garbe M.D.   On: 12/04/2017 05:33   Ct Angio Neck W Or Wo Contrast  Result Date: 12/03/2017 CLINICAL DATA:  Code stroke.  Acute onset of right-sided weakness. EXAM: CT ANGIOGRAPHY HEAD AND NECK TECHNIQUE: Multidetector CT imaging of the head and neck was performed using the standard protocol during bolus administration of intravenous contrast. Multiplanar CT image reconstructions and MIPs were obtained to evaluate the vascular anatomy. Carotid stenosis measurements (when applicable) are obtained utilizing NASCET criteria, using the distal internal carotid diameter as the  denominator. CONTRAST:  55mL ISOVUE-370 IOPAMIDOL (ISOVUE-370) INJECTION 76% COMPARISON:  CT head without contrast from the same day at 8:46 a.m. at [facility]. FINDINGS: CT HEAD FINDINGS Brain: Repeat noncontrast CT of the head again demonstrates remote infarcts of the left frontal operculum, left caudate head, and high right parietal lobe. Remote posterior left cerebellar infarcts are again seen. Anterior left insular hypoattenuation may be slightly worse than on the prior exam. The precentral gyrus is intact. Vascular: Atherosclerotic calcifications are again noted within the cavernous internal carotid arteries bilaterally. There is no hyperdense vessel. Skull: Calvarium is intact. No focal lytic or blastic lesions are present. Sinuses: Paranasal sinuses and mastoid air cells are clear. Orbits: Remote blowout fracture is again noted in the right orbit. Right lens replacement is present. Globes and orbits are otherwise within normal limits. Review of the MIP images confirms the above findings CTA NECK FINDINGS Aortic arch: A 3 vessel arch configuration is present. Atherosclerotic calcifications are present at the origins of the great vessels without a significant stenosis. There is no aneurysm. Right carotid system: The right common carotid artery is within normal limits. Atherosclerotic changes are noted in the proximal right ICA. More distal right internal carotid artery is within normal limits. Left carotid system: The left common carotid artery is within normal limits. Atherosclerotic calcifications are present  at the carotid bifurcation proximal left ICA without significant stenosis. There is mild tortuosity of the more distal left ICA without significant stenosis. Vertebral arteries: Atherosclerotic calcifications are present at the origin of the right vertebral artery without significant stenosis. The left vertebral artery is occluded at its origin. There is very faint reconstitution  at the C2-3 level without intraluminal contrast at the foramen magnum. Skeleton: Rightward curvature is present in the lower cervical spine, compensating for leftward curvature in the upper thoracic spine. Other neck: Heterogeneous thyroid is present without a dominant lesion. Patient is intubated. No significant cervical adenopathy is present. Salivary glands are within normal limits. Upper chest: A large right pleural effusion is again seen. The moderate left-sided pleural effusion is present. There is partial collapse of the right upper lobe. Possible soft tissue fullness is present at the right hila. Recommend dedicated CT of the chest with contrast as tolerated. Review of the MIP images confirms the above findings CTA HEAD FINDINGS Anterior circulation: Atherosclerotic calcifications are present within the cavernous internal carotid arteries bilaterally. There is no significant luminal stenosis relative to the more distal vessels through the ICA terminus. The M1 segments are intact bilaterally. No significant proximal occlusion is present on the right. There is moderate attenuation of superior and posterior M2 divisions. The left M1 segment is intact. Proximal superior left M2 and M3 segment is occluded or highly stenotic. The posterior left M2 division is not visualized. Posterior circulation: The right vertebral artery feeds the basilar. PICA origin is visualized. The basilar artery is small centrally terminating at the superior cerebellar arteries. Fetal type posterior cerebral arteries are present bilaterally with moderate distal segmental stenoses but no significant proximal stenosis or occlusion. Venous sinuses: Dural sinuses are patent. Straight sinus and deep cerebral veins are intact. Cortical veins are unremarkable. Anatomic variants: Fetal type posterior cerebral arteries bilaterally. Delayed phase: Postcontrast images are not performed in the acute setting. Review of the MIP images confirms the  above findings IMPRESSION: 1. Proximal posterior division left M2 occlusion. 2. More distal anterior division left M2/M3 occlusion. 3. Extensive intracranial small vessel disease. 4. Occluded left vertebral artery with minimal reconstitution in the neck. 5. Fetal type posterior cerebral arteries bilaterally. 6. Atherosclerotic changes at the carotid bifurcations and cavernous internal carotid arteries bilaterally without other significant stenosis. 7. Aortic Atherosclerosis (ICD10-I70.0). Calcifications involve the great vessel origins without significant stenoses. 8. Large bilateral pleural effusions, right greater than left. 9. Parenchymal volume loss in the right upper lobe likely related to the effusions. Central mass lesion not excluded. 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 Recommend dedicated CT the chest with contrast as the patient's condition allows. 10. These results were called by telephone at the time of interpretation on 12/03/2017 at 11:02 am to Dr. Roland Rack , who verbally acknowledged these results. Electronically Signed   By: San Morelle M.D.   On: 12/03/2017 11:05   Ct Chest Wo Contrast  Addendum Date: 12/04/2017   ADDENDUM REPORT: 12/04/2017 17:04 ADDENDUM: The upper abdominal section of the original report was inadvertently not included and is dictated below. UPPER ABDOMEN: Liver margin appears irregular. Decreased attenuation in the dome is likely due to dilated hepatic veins. Stones are seen in the gallbladder. Probable adrenal thickening. Visualized portions of the kidneys, spleen, pancreas and stomach are grossly unremarkable. Upper abdominal lymph nodes do not appear enlarged by CT size criteria. Ascites. IMPRESSION: 8. Marginal irregularity of the liver is indicative of cirrhosis. 9.  Cholelithiasis. 10.     Ascites. Electronically Signed   By: Lorin Picket M.D.   On: 12/04/2017 17:04   Result Date: 12/04/2017 CLINICAL DATA:  Pleural  effusions. EXAM: CT CHEST WITHOUT CONTRAST TECHNIQUE: Multidetector CT imaging of the chest was performed following the standard protocol without IV contrast. COMPARISON:  Chest radiograph 12/03/2017 and CT chest 01/01/2017. FINDINGS: Cardiovascular: Right IJ central line terminates in the SVC. Atherosclerotic calcification of the arterial vasculature, including coronary arteries. Pulmonary arteries are enlarged. Heart is moderately enlarged with a moderate pericardial effusion. Mediastinum/Nodes: Low internal jugular and mediastinal adenopathy measures up to 1.1 cm in the right paratracheal station. There are calcified mediastinal and bihilar lymph nodes. Distal periesophageal lymph node measures 11 mm, as before. No axillary adenopathy. Lungs/Pleura: Image quality is degraded by respiratory motion. Large right pleural effusion and adjacent collapse/consolidation obscure the majority of the right hemithorax. Nodular areas in the apex of the left upper lobe are grossly stable. Scattered areas of peribronchovascular ground-glass in the left upper lobe, possibly infectious or inflammatory in etiology. Moderate left pleural effusion with collapse/consolidation in the left lower lobe. Endotracheal tube terminates at the orifice of the left mainstem bronchus. Musculoskeletal: Degenerative changes in the spine. No worrisome lytic or sclerotic lesions. IMPRESSION: 1. Endotracheal tube is low lying, at the orifice of the left mainstem bronchus. Pulling back 3-4 cm would likely better position the tip above the carina. 2. Large right pleural effusion with collapse/consolidation in the adjacent right lung. 3. Moderate left pleural effusion with collapse/consolidation in the left lower lobe. 4. Scattered areas of nodularity and ground-glass in the left upper lobe, poorly evaluated due to respiratory motion. 5. Moderate pericardial effusion. 6. Aortic atherosclerosis (ICD10-170.0). Coronary artery calcification. 7. Enlarged  pulmonary arteries, indicative of pulmonary arterial hypertension. Electronically Signed: By: Lorin Picket M.D. On: 12/04/2017 16:47    Ct Head Code Stroke Wo Contrast  Result Date: 12/03/2017 CLINICAL DATA:  Code stroke. Acute onset of unresponsiveness while in the emergency department. Patient is known nonverbal. Acute onset of right-sided weakness. EXAM: CT HEAD WITHOUT CONTRAST TECHNIQUE: Contiguous axial images were obtained from the base of the skull through the vertex without intravenous contrast. COMPARISON:  None. FINDINGS: Brain: No acute infarct, hemorrhage, or mass lesion is present. 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 Hypodense infarct is present in the high right parietal lobe, likely also remote. A remote infarct of the left frontal operculum is present. Associated volume loss is noted. No acute left-sided infarct is present. No acute hemorrhage or mass lesion is present. Remote infarcts are present posteriorly within the left cerebellum. Brainstem is unremarkable. No significant extra-axial fluid collection is present. No significant extra-axial fluid collection is present. Vascular: Atherosclerotic calcifications are present within the internal carotid arteries bilaterally. There is no hyperdense vessel. Skull: Calvarium is intact. No focal lytic or blastic lesions are present. Sinuses/Orbits: The paranasal sinuses are clear. A remote right orbital blowout fracture is present. A right lens replacement is present. Globes and orbits are otherwise within normal limits. ASPECTS [facility] Stroke Program Early CT Score) - Ganglionic level infarction (caudate, lentiform nuclei, internal capsule, insula, M1-M3 cortex): 7/7 - Supraganglionic infarction (M4-M6 cortex): 3/3. Total score (0-10 with 10 being normal): 10/10. IMPRESSION: 1. Infarcts involving the high right parietal lobe and left frontal operculum appear  remote. 2. Remote lacunar infarct of the left caudate head. 3. Remote infarcts of the posterior left cerebellum.  4. No acute infarct. 5. 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 6. ASPECTS is 10/10 These results were called by telephone at the time of interpretation on 12/03/2017 at 9:03 am to Dr. Blanchie Dessert , who verbally acknowledged these results. Electronically Signed   By: San Morelle M.D.   On: 12/03/2017 09:08    TTE - Left ventricle: The cavity size was below normal. Wall thickness   was increased in a pattern of mild LVH. Systolic function was   vigorous. The estimated ejection fraction was in the range of 65%   to 70%. Wall motion was normal; there were no regional wall   motion abnormalities. Doppler parameters are consistent with   abnormal left ventricular relaxation (grade 1 diastolic   dysfunction). Doppler parameters are consistent with high   ventricular filling pressure. - Ventricular septum: The contour showed diastolic flattening and   systolic flattening. These changes are consistent with RV volume   and pressure overload. - Aortic valve: Mildly calcified annulus. Trileaflet. - Mitral valve: Severely calcified annulus. Severely calcified   leaflets . The findings are consistent with mild stenosis. Mean   gradient (D): 4 mm Hg. Valve area by pressure half-time: 2.12   cm^2. Valve area by continuity equation (using LVOT flow): 0.81   cm^2. - Right ventricle: The cavity size was severely dilated. Pacer wire   or catheter noted in right ventricle. Systolic function was   reduced. - Right atrium: The atrium was severely dilated. Pacer wire or   catheter noted in right atrium. - Tricuspid valve: There was severe regurgitation. - Pulmonic valve: There was mild regurgitation. - Pulmonary arteries: Systolic pressure was severely increased. PA   peak pressure: 71 mm Hg (S). - Inferior vena cava: The CVP could  not accurately be assessed as   the patient was ventilated (via IVC assessment). - Pericardium, extracardiac: A moderate to large size pericardial   effusion was noted. Features were not consistent with tamponade   physiology.   PHYSICAL EXAM  Temp:  [97.5 F (36.4 C)-98 F (36.7 C)] 97.7 F (36.5 C) (05/09 0400) Pulse Rate:  [79-91] 79 (05/09 1125) Resp:  [16-18] 16 (05/09 1125) BP: (111-144)/(61-77) 144/69 (05/09 1125) SpO2:  [78 %-100 %] 97 % (05/09 1125) Weight:  [185 lb 6.5 oz (84.1 kg)] 185 lb 6.5 oz (84.1 kg) (05/09 0400)  General - Well nourished, well developed, in no acute distress  Ophthalmologic - fundi not visualized due to noncooperation.  Cardiovascular - Regular rate and rhythm, not in afib.  Neuro - awake, alert, orientated to self, age and place, but not to time, language much improved, able to repeat, and name 2/3, able to follow simple commands, no dysarthria. PERRL, EOMI but incomplete gaze bilaterally, blinking to visual threat bilaterally. Facial symmetric and tongue mid position. Moving all extremities equally and at least 4+/5. DTR 1+ and no babinski. Sensation symmetrical, coordination not cooperative and gait not tested.   ASSESSMENT/PLAN Ms. AYLINN RYDBERG is a 77 y.o. 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 No tPA given due to recent GI bleeding.  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 Contrast  Repeat CT x 2 - left inferior insular small ICH  2D Echo EF 65-70%, severely dilated RA, moderate to large pericardial effusion  LDL 38  HgbA1c 8.6  SCDs for VTE prophylaxis  On diet   No antithrombotic prior to admission, now on on ASA 81mg  after repeat CT with stable hematoma  Ongoing aggressive stroke risk factor management  Therapy recommendations:  CIR  Disposition:  Pending   Hemorrhagic conversion post procedure - left inferior insular  CT repeat x 2 no hematoma enlargement  INR 1.85->1.76->1.53->1.51 due to cirrhosis   No need to reverse INR this time, as neuro stable and ICH is small and high risk of thromboembolic event  Repeat CT stable hematoma and now on ASA 81mg   Respiratory distress due to b/l large pleural effusion, pericardial effusion and sarcoidosis  Bilateral large pleural effusion confirmed on CT  Long hx of sarcoidosis with lung disease  self extubated, tolerating well  On nasal cannular with mild respiratory distress  AFib not on AC but INR 1.7-1.8 baseline  Was on eliquis 5mg  bid with high INR  Stopped in 10/2017 due to concerns of black stool   EGD showed severe portal hypertensive gastropathy  eliquis discontinued  Planned for more GI work up as outpt  Currently not on AC due to anemia and ICH as well as elevated INR  Rate controlled   Given severe PHG, GIB, elevated INR, and anemia, not good candidate for anticoagulation at this time.   Right heart CHF  TTE EF 65-70%, severely dilated RA, moderate to large pericardial effusion  CCM on board  CT showed large pleural effusion  Self extubated and tolerating well so far  Consider cardiology consult if felt appropriate  Alcoholic liver cirrhosis  AST/ALT WNL  INR high in  10/2017  INR 1.85->1.76->1.53->1.51->pending  EGD showed severe PHG   Recent GIB in 10/2017 due to severe PHG - GI following as outpt  No antithrombotics PTA   Anemia   Hb 10.1->9.1->8.3->8.0->pending  Close monitoring  Pending stool occult blood  Likely due to recent GIB, IR procedure, and PHG  Consider PRBC if needed  CKD, improving  Cre 2.04->1.98->1.43->1.32->1.36->pending  Off IVF  I/O monitoring  Diabetes  HgbA1c 8.6 goal < 7.0  Uncontrolled  CBG monitoring - glucose stable  SSI  DM education and close PCP follow up  Hypotension, stable on levophed  BP stable  off levophed  BP goal 110-140   Other Stroke Risk Factors  Advanced age  ETOH use  Other Active Problems  Pacemaker present  Left breast cancer in situ on tamoxifen   Hospital day # 5   Rosalin Hawking, MD PhD Stroke Neurology 12/08/2017 11:49 AM    To contact Stroke Continuity provider, please refer to [email or web address] After hours, contact General Neurology

## 2017-12-08 NOTE — Progress Notes (Signed)
Pt refused AM labs education given. pt still refused MD made aware

## 2017-12-08 NOTE — Plan of Care (Signed)
  Problem: Education: Goal: Knowledge of General Education information will improve Outcome: Progressing   Problem: Health Behavior/Discharge Planning: Goal: Ability to manage health-related needs will improve Outcome: Progressing   Problem: Clinical Measurements: Goal: Ability to maintain clinical measurements within normal limits will improve Outcome: Progressing Goal: Will remain free from infection Outcome: Progressing Goal: Diagnostic test results will improve Outcome: Progressing Goal: Respiratory complications will improve Outcome: Progressing Goal: Cardiovascular complication will be avoided Outcome: Progressing   Problem: Activity: Goal: Risk for activity intolerance will decrease Outcome: Progressing   Problem: Nutrition: Goal: Adequate nutrition will be maintained Outcome: Progressing   Problem: Coping: Goal: Level of anxiety will decrease Outcome: Progressing   Problem: Elimination: Goal: Will not experience complications related to bowel motility Outcome: Progressing Goal: Will not experience complications related to urinary retention Outcome: Progressing   Problem: Pain Managment: Goal: General experience of comfort will improve Outcome: Progressing   Problem: Safety: Goal: Ability to remain free from injury will improve Outcome: Progressing   Problem: Skin Integrity: Goal: Risk for impaired skin integrity will decrease Outcome: Progressing   Problem: Education: Goal: Knowledge of disease or condition will improve Outcome: Progressing Goal: Knowledge of secondary prevention will improve Outcome: Progressing Goal: Knowledge of patient specific risk factors addressed and post discharge goals established will improve Outcome: Progressing   Problem: Coping: Goal: Will verbalize positive feelings about self Outcome: Progressing Goal: Will identify appropriate support needs Outcome: Progressing   Problem: Health Behavior/Discharge  Planning: Goal: Ability to manage health-related needs will improve Outcome: Progressing   Problem: Self-Care: Goal: Ability to participate in self-care as condition permits will improve Outcome: Progressing Goal: Verbalization of feelings and concerns over difficulty with self-care will improve Outcome: Progressing Goal: Ability to communicate needs accurately will improve Outcome: Progressing   Problem: Nutrition: Goal: Risk of aspiration will decrease Outcome: Progressing Goal: Dietary intake will improve Outcome: Progressing   Problem: Ischemic Stroke/TIA Tissue Perfusion: Goal: Complications of ischemic stroke/TIA will be minimized Outcome: Progressing   

## 2017-12-08 NOTE — Evaluation (Signed)
Speech Language Pathology Evaluation Patient Details Name: Ana Gardner MRN: [id number] DOB: 1941/01/28 Today's Date: 12/08/2017 Time: 7829-5621 SLP Time Calculation (min) (ACUTE ONLY): 31 min  Problem List:  Patient Active Problem List   Diagnosis Date Noted  . Paroxysmal atrial fibrillation (HCC)   . Stroke ([place]) 12/03/2017  . Acute CVA (cerebrovascular accident) ([place]) 12/03/2017  . Stroke (cerebrum) ([place]) 12/03/2017  . Middle cerebral artery embolism, left 12/03/2017  . Acute on chronic congestive heart failure ([place])   . Acute respiratory failure with hypoxia ([place])   . Recurrent right pleural effusion    Past Medical History:  Past Medical History:  Diagnosis Date  . Cancer (HCC)    B/L  . CHF (congestive heart failure) ([place])   . Diabetes mellitus without complication ([place])   . Sarcoidosis    Past Surgical History:  Past Surgical History:  Procedure Laterality Date  . IR CT HEAD LTD  12/03/2017  . IR PERCUTANEOUS ART THROMBECTOMY/INFUSION INTRACRANIAL INC DIAG ANGIO  12/03/2017  . MASTECTOMY Left 2004  . PACEMAKER IMPLANT    . RADIOLOGY WITH ANESTHESIA N/A 12/03/2017   Procedure: CODE STROKE;  Surgeon: Luanne Bras, MD;  Location: [facility];  Service: Radiology;  Laterality: N/A;   HPI:  Ana Gardner is a 77 y.o. 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 No tPA given due to recent GI bleeding.  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 No MRI due to pacer. Intubated from 5/4-5/7 when she self extubated.    Assessment / Plan / Recommendation Clinical Impression   Pt presents with moderately severe cognitive-linguistic deficits as  supported by a score of 15/30 on the Mini Mental State Exam.  Pt is able to follow written and verbal commands and answer yes/no questions appropriately; however, her verbal expression is limited by word finding impairment and perseveration.  Pt also has difficulty repeating at the phrase level which SLP suspects is correlated to cognitive deficits characterized by decreased selective attention to tasks and decreased retrieval of information.  Additionally, pt has decreased emergent awareness of her functional and verbal deficits as well as decreased functional problem solving.  As a result, pt needed mod-max assist to complete tasks on evaluation.  Pt reports that she lived alone and drove prior to admission.  No family present to endorse baseline cognitive-linguistic function, although pt does endorse that she sometimes forgot to take her medications as prescribed.  Given the abovementioned deficits, pt would benefit from skilled ST while inpatient in order to maximize functional independence and reduce burden of care prior to discharge.  Anticipate that pt will need f/u ST services at next level of care.      SLP Assessment  SLP Recommendation/Assessment: Patient needs continued Speech Lanaguage Pathology Services SLP Visit Diagnosis: Cognitive communication deficit (R41.841)    Follow Up Recommendations  Inpatient Rehab    Frequency and Duration min 2x/week         SLP Evaluation Cognition  Overall Cognitive Status: Impaired/Different from baseline Arousal/Alertness: Awake/alert Orientation Level: Oriented to person;Oriented to place;Oriented to situation;Disoriented to time Attention: Selective Selective Attention: Impaired Selective Attention Impairment: Functional basic;Verbal basic Memory: Impaired Memory Impairment: Retrieval deficit Awareness: Impaired Awareness Impairment: Emergent impairment Problem  Solving: Impaired Problem Solving Impairment: Functional basic Executive  Function: Self Monitoring;Self Correcting Self Monitoring: Impaired Self Monitoring Impairment: Verbal basic;Functional basic Self Correcting: Impaired Self Correcting Impairment: Verbal basic;Functional basic Safety/Judgment: Impaired       Comprehension  Auditory Comprehension Overall Auditory Comprehension: Appears within functional limits for tasks assessed Reading Comprehension Reading Status: Within funtional limits    Expression Expression Primary Mode of Expression: Verbal Verbal Expression Overall Verbal Expression: Impaired Initiation: No impairment Level of Generative/Spontaneous Verbalization: Sentence;Conversation Repetition: Impaired Level of Impairment: Phrase level Naming: No impairment Effective Techniques: Semantic cues Written Expression Written Expression: Exceptions to [facility] Self Formulation Ability: Word   Oral / Motor  Oral Motor/Sensory Function Overall Oral Motor/Sensory Function: Within functional limits Motor Speech Overall Motor Speech: Appears within functional limits for tasks assessed   GO                    Emilio Math 12/08/2017, 9:03 AM

## 2017-12-08 NOTE — Care Management Important Message (Signed)
Important Message  Patient Details  Name: Ana Gardner MRN: [id number] Date of Birth: Feb 01, 1941   Medicare Important Message Given:  Yes  Due to procedure patient not able to sign Patient aware of the unsigned copy left  Marily Konczal 12/08/2017, 3:14 PM

## 2017-12-08 NOTE — Progress Notes (Signed)
Rehab admissions - I met with patient this am.  I then met with daughter this afternoon.  Daughter is an Forensic psychologist in [place], [place] and works FT, travels and cannot provide supervision after a potential rehab stay.  Daughter in agreement to SNF placement.  I have let the social worker know of need for SNF.  Call me for questions.  #[phone]

## 2017-12-09 ENCOUNTER — Inpatient Hospital Stay (HOSPITAL_COMMUNITY): Payer: Medicare HMO

## 2017-12-09 ENCOUNTER — Encounter (HOSPITAL_COMMUNITY): Payer: Self-pay | Admitting: Student

## 2017-12-09 DIAGNOSIS — J9621 Acute and chronic respiratory failure with hypoxia: Secondary | ICD-10-CM

## 2017-12-09 HISTORY — PX: IR THORACENTESIS ASP PLEURAL SPACE W/IMG GUIDE: IMG5380

## 2017-12-09 LAB — BODY FLUID CELL COUNT WITH DIFFERENTIAL
Eos, Fluid: 0 %
Lymphs, Fluid: 32 %
Monocyte-Macrophage-Serous Fluid: 39 % — ABNORMAL LOW (ref 50–90)
Neutrophil Count, Fluid: 29 % — ABNORMAL HIGH (ref 0–25)
Total Nucleated Cell Count, Fluid: 119 uL (ref 0–1000)

## 2017-12-09 LAB — GLUCOSE, PLEURAL OR PERITONEAL FLUID: Glucose, Fluid: 205 mg/dL

## 2017-12-09 LAB — CBC
HCT: 25.6 % — ABNORMAL LOW (ref 36.0–46.0)
Hemoglobin: 8.4 g/dL — ABNORMAL LOW (ref 12.0–15.0)
MCH: 22.8 pg — ABNORMAL LOW (ref 26.0–34.0)
MCHC: 32.8 g/dL (ref 30.0–36.0)
MCV: 69.4 fL — ABNORMAL LOW (ref 78.0–100.0)
Platelets: 352 K/uL (ref 150–400)
RBC: 3.69 MIL/uL — ABNORMAL LOW (ref 3.87–5.11)
RDW: 19.5 % — ABNORMAL HIGH (ref 11.5–15.5)
WBC: 7.9 K/uL (ref 4.0–10.5)

## 2017-12-09 LAB — GLUCOSE, CAPILLARY
Glucose-Capillary: 150 mg/dL — ABNORMAL HIGH (ref 65–99)
Glucose-Capillary: 221 mg/dL — ABNORMAL HIGH (ref 65–99)
Glucose-Capillary: 241 mg/dL — ABNORMAL HIGH (ref 65–99)

## 2017-12-09 LAB — BASIC METABOLIC PANEL WITH GFR
Anion gap: 12 (ref 5–15)
BUN: 29 mg/dL — ABNORMAL HIGH (ref 6–20)
CO2: 22 mmol/L (ref 22–32)
Calcium: 8.9 mg/dL (ref 8.9–10.3)
Chloride: 100 mmol/L — ABNORMAL LOW (ref 101–111)
Creatinine, Ser: 1.51 mg/dL — ABNORMAL HIGH (ref 0.44–1.00)
GFR calc Af Amer: 38 mL/min — ABNORMAL LOW
GFR calc non Af Amer: 33 mL/min — ABNORMAL LOW
Glucose, Bld: 154 mg/dL — ABNORMAL HIGH (ref 65–99)
Potassium: 4.6 mmol/L (ref 3.5–5.1)
Sodium: 134 mmol/L — ABNORMAL LOW (ref 135–145)

## 2017-12-09 LAB — PROTEIN, PLEURAL OR PERITONEAL FLUID: Total protein, fluid: 3.2 g/dL

## 2017-12-09 LAB — PROTEIN, TOTAL: Total Protein: 6.9 g/dL (ref 6.5–8.1)

## 2017-12-09 LAB — PROTIME-INR
INR: 1.35
Prothrombin Time: 16.6 s — ABNORMAL HIGH (ref 11.4–15.2)

## 2017-12-09 LAB — LACTATE DEHYDROGENASE, PLEURAL OR PERITONEAL FLUID: LD, Fluid: 78 U/L — ABNORMAL HIGH (ref 3–23)

## 2017-12-09 LAB — LACTATE DEHYDROGENASE: LDH: 164 U/L (ref 98–192)

## 2017-12-09 MED ORDER — LIDOCAINE HCL 1 % IJ SOLN
INTRAMUSCULAR | Status: AC | PRN
  Administered 2017-12-09: 10 mL

## 2017-12-09 MED ORDER — LIDOCAINE HCL (PF) 2 % IJ SOLN
INTRAMUSCULAR | Status: AC
  Filled 2017-12-09: qty 20

## 2017-12-09 NOTE — Progress Notes (Signed)
STROKE TEAM PROGRESS NOTE   SUBJECTIVE (INTERVAL HISTORY) She was walking with PT/OT in the hallway. She still has SOB with exertion. Pending thoracentesis. INR decreased to 1.35. Neuro stable, no acute event.      OBJECTIVE Temp:  [97.7 F (36.5 C)-98.7 F (37.1 C)] 97.7 F (36.5 C) (05/10 0400) Pulse Rate:  [79-88] 79 (05/10 0400) Cardiac Rhythm: Atrial paced;Bundle branch block;Heart block (05/10 0700) Resp:  [15-20] 16 (05/10 0400) BP: (116-144)/(67-75) 127/69 (05/10 0400) SpO2:  [81 %-100 %] 98 % (05/10 0400) Weight:  [185 lb 3 oz (84 kg)] 185 lb 3 oz (84 kg) (05/10 0400)  Recent Labs  Lab 12/08/17 0625 12/08/17 1138 12/08/17 1629 12/08/17 2151 12/09/17 0601  GLUCAP 112* 144* 203* 156* 150*   Recent Labs  Lab 12/05/17 0450 12/05/17 1854 12/06/17 0431 12/07/17 0334 12/08/17 1258 12/09/17 0450  NA 134*  --  136 139 138 134*  K 3.6  --  4.2 4.5 4.7 4.6  CL 101  --  103 106 103 100*  CO2 24  --  24 26 22 22   GLUCOSE 163*  --  225* 169* 160* 154*  BUN 26*  --  24* 25* 30* 29*  CREATININE 1.43*  --  1.32* 1.36* 1.53* 1.51*  CALCIUM 8.2*  --  8.4* 8.7* 9.1 8.9  MG 1.8 1.8 2.0 1.9  --   --   PHOS  --  3.5 3.7 3.6  --   --    Recent Labs  Lab 12/03/17 0909 12/09/17 0450  AST 19  --   ALT 10*  --   ALKPHOS 48  --   BILITOT 1.0  --   PROT 5.8* 6.9  ALBUMIN 2.8*  --    Recent Labs  Lab 12/03/17 0909  12/04/17 0417 12/06/17 0431 12/07/17 0334 12/08/17 1258 12/09/17 0450  WBC 7.1  --  19.0* 16.1* 10.1 8.3 7.9  NEUTROABS 4.5  --  15.9*  --   --   --   --   HGB 10.1*   < > 9.1* 8.3* 8.0* 9.2* 8.4*  HCT 28.6*   < > 28.1* 26.1* 25.5* 28.5* 25.6*  MCV 68.1*  --  69.7* 69.6* 69.7* 69.9* 69.4*  PLT 432*  --  408* 313 306 315 352   < > = values in this interval not displayed.   Recent Labs  Lab 12/03/17 0909  TROPONINI <0.03   Recent Labs    12/07/17 0334 12/09/17 0450  LABPROT 18.1* 16.6*  INR 1.51 1.35   No results for input(s): COLORURINE,  LABSPEC, PHURINE, GLUCOSEU, HGBUR, BILIRUBINUR, KETONESUR, PROTEINUR, UROBILINOGEN, NITRITE, LEUKOCYTESUR in the last 72 hours.  Invalid input(s): APPERANCEUR     Component Value Date/Time   CHOL 70 12/04/2017 0417   TRIG 72 12/04/2017 0417   HDL 18 (L) 12/04/2017 0417   CHOLHDL 3.9 12/04/2017 0417   VLDL 14 12/04/2017 0417   LDLCALC 38 12/04/2017 0417   Lab Results  Component Value Date   HGBA1C 8.6 (H) 12/04/2017      Component Value Date/Time   LABOPIA NONE DETECTED 12/03/2017 1726   COCAINSCRNUR NONE DETECTED 12/03/2017 1726   LABBENZ POSITIVE (A) 12/03/2017 1726   AMPHETMU NONE DETECTED 12/03/2017 1726   THCU NONE DETECTED 12/03/2017 1726   LABBARB NONE DETECTED 12/03/2017 1726    Recent Labs  Lab 12/03/17 0909  ETH <10    I have personally reviewed the radiological images below and agree with the radiology interpretations.  Ct Angio  Head W Or Wo Contrast  Result Date: 12/03/2017 CLINICAL DATA:  Code stroke.  Acute onset of right-sided weakness. EXAM: CT ANGIOGRAPHY HEAD AND NECK TECHNIQUE: Multidetector CT imaging of the head and neck was performed using the standard protocol during bolus administration of intravenous contrast. Multiplanar CT image reconstructions and MIPs were obtained to evaluate the vascular anatomy. Carotid stenosis measurements (when applicable) are obtained utilizing NASCET criteria, using the distal internal carotid diameter as the denominator. CONTRAST:  58mL ISOVUE-370 IOPAMIDOL (ISOVUE-370) INJECTION 76% COMPARISON:  CT head without contrast from the same day at 8:46 a.m. at [facility], [facility]. FINDINGS: CT HEAD FINDINGS Brain: Repeat noncontrast CT of the head again demonstrates remote infarcts of the left frontal operculum, left caudate head, and high right parietal lobe. Remote posterior left cerebellar infarcts are again seen. Anterior left insular hypoattenuation may be slightly worse than on the prior exam. The precentral gyrus  is intact. Vascular: Atherosclerotic calcifications are again noted within the cavernous internal carotid arteries bilaterally. There is no hyperdense vessel. Skull: Calvarium is intact. No focal lytic or blastic lesions are present. Sinuses: Paranasal sinuses and mastoid air cells are clear. Orbits: Remote blowout fracture is again noted in the right orbit. Right lens replacement is present. Globes and orbits are otherwise within normal limits. Review of the MIP images confirms the above findings CTA NECK FINDINGS Aortic arch: A 3 vessel arch configuration is present. Atherosclerotic calcifications are present at the origins of the great vessels without a significant stenosis. There is no aneurysm. Right carotid system: The right common carotid artery is within normal limits. Atherosclerotic changes are noted in the proximal right ICA. More distal right internal carotid artery is within normal limits. Left carotid system: The left common carotid artery is within normal limits. Atherosclerotic calcifications are present at the carotid bifurcation proximal left ICA without significant stenosis. There is mild tortuosity of the more distal left ICA without significant stenosis. Vertebral arteries: Atherosclerotic calcifications are present at the origin of the right vertebral artery without significant stenosis. The left vertebral artery is occluded at its origin. There is very faint reconstitution at the C2-3 level without intraluminal contrast at the foramen magnum. Skeleton: Rightward curvature is present in the lower cervical spine, compensating for leftward curvature in the upper thoracic spine. Other neck: Heterogeneous thyroid is present without a dominant lesion. Patient is intubated. No significant cervical adenopathy is present. Salivary glands are within normal limits. Upper chest: A large right pleural effusion is again seen. The moderate left-sided pleural effusion is present. There is partial collapse of  the right upper lobe. Possible soft tissue fullness is present at the right hila. Recommend dedicated CT of the chest with contrast as tolerated. Review of the MIP images confirms the above findings CTA HEAD FINDINGS Anterior circulation: Atherosclerotic calcifications are present within the cavernous internal carotid arteries bilaterally. There is no significant luminal stenosis relative to the more distal vessels through the ICA terminus. The M1 segments are intact bilaterally. No significant proximal occlusion is present on the right. There is moderate attenuation of superior and posterior M2 divisions. The left M1 segment is intact. Proximal superior left M2 and M3 segment is occluded or highly stenotic. The posterior left M2 division is not visualized. Posterior circulation: The right vertebral artery feeds the basilar. PICA origin is visualized. The basilar artery is small centrally terminating at the superior cerebellar arteries. Fetal type posterior cerebral arteries are present bilaterally with moderate distal segmental stenoses but no  significant proximal stenosis or occlusion. Venous sinuses: Dural sinuses are patent. Straight sinus and deep cerebral veins are intact. Cortical veins are unremarkable. Anatomic variants: Fetal type posterior cerebral arteries bilaterally. Delayed phase: Postcontrast images are not performed in the acute setting. Review of the MIP images confirms the above findings IMPRESSION: 1. Proximal posterior division left M2 occlusion. 2. More distal anterior division left M2/M3 occlusion. 3. Extensive intracranial small vessel disease. 4. Occluded left vertebral artery with minimal reconstitution in the neck. 5. Fetal type posterior cerebral arteries bilaterally. 6. Atherosclerotic changes at the carotid bifurcations and cavernous internal carotid arteries bilaterally without other significant stenosis. 7. Aortic Atherosclerosis (ICD10-I70.0). Calcifications involve the great vessel  origins without significant stenoses. 8. Large bilateral pleural effusions, right greater than left. 9. Parenchymal volume loss in the right upper lobe likely related to the effusions. Central mass lesion not excluded. 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 Recommend dedicated CT the chest with contrast as the patient's condition allows. 10. These results were called by telephone at the time of interpretation on 12/03/2017 at 11:02 am to Dr. Roland Rack , who verbally acknowledged these results. Electronically Signed   By: San Morelle M.D.   On: 12/03/2017 11:05   Ct Head Wo Contrast  Result Date: 12/07/2017 CLINICAL DATA:  Followup intracranial hemorrhage EXAM: CT HEAD WITHOUT CONTRAST TECHNIQUE: Contiguous axial images were obtained from the base of the skull through the vertex without intravenous contrast. COMPARISON:  12/04/2017 and previous. FINDINGS: Brain: Small area of post treatment hemorrhage in the left insular region continues to diminish, measuring 8 by 14 mm today. No progressive or new hemorrhage. Subacute infarction in the left insular and posterior frontal region appears the same. Evidence of increased swelling. No shift. No other acute infarction. Atrophy and chronic small-vessel ischemic changes elsewhere appear the same. Vascular: There is atherosclerotic calcification of the major vessels at the base of the brain. Skull: Negative Sinuses/Orbits: Clear sinuses. Old orbital blowout fracture on the right. Other: None IMPRESSION: No worsening or progressive findings. Diminishing conspicuity of an 8 by 14 mm region of post treatment hemorrhage in the left insula. No evidence of infarct extension. Electronically Signed   By: Nelson Chimes M.D.   On: 12/07/2017 06:38   Ct Head Wo Contrast  Result Date: 12/04/2017 CLINICAL DATA:  Follow-up exam for acute stroke, status post catheter directed revascularization. EXAM: CT HEAD WITHOUT CONTRAST TECHNIQUE: Contiguous  axial images were obtained from the base of the skull through the vertex without intravenous contrast. COMPARISON:  Prior CT from earlier the same day. FINDINGS: Brain: Previously noted hyperdensity at the level of the left insula again seen, measuring 10 x 12 x 14 mm (transverse by AP by craniocaudad). Overall, this is similar in size with slightly decreased attenuation as compared to previous. Given persistence, hemorrhage is favored, although a degree of contrast staining may be contributory. Evolving acute left MCA territory infarcts within the adjacent all left frontal operculum and posterior frontal region again seen, slightly more apparent on current exam. No mass effect. No other acute intracranial hemorrhage. No other acute large vessel territory infarct. No mass lesion or midline shift. No hydrocephalus. No extra-axial fluid collection. Underlying atrophy with chronic small vessel ischemic changes again noted. Vascular: No new hyperdense vessel. Scattered vascular calcifications noted within the carotid siphons. Skull: No acute scalp soft tissue abnormality.  Calvarium intact. Sinuses/Orbits: Globes and orbital soft tissues within normal limits. Small air-fluid levels noted within the sphenoid sinuses. Scattered  mucosal thickening throughout the ethmoidal air cells and maxillary sinuses. Trace right mastoid effusion. Other: None. IMPRESSION: 1. Persistent hyperdensity at the left insula, similar in size measuring up to 14 mm with slightly decreased attenuation. Hemorrhage is favored. 2. Additional multifocal areas of evolving left MCA territory infarction, stable in distribution as compared to previous. 3. No other new acute intracranial abnormality. Electronically Signed   By: Jeannine Boga M.D.   On: 12/04/2017 14:29   Ct Head Wo Contrast  Result Date: 12/04/2017 CLINICAL DATA:  77 y/o F; stroke post catheter thrombectomy for follow-up. EXAM: CT HEAD WITHOUT CONTRAST TECHNIQUE: Contiguous axial  images were obtained from the base of the skull through the vertex without intravenous contrast. COMPARISON:  11/04/2017 CT head, CTA head, angiogram head. FINDINGS: Brain: Amorphous density within the left anterior insula measuring 10 x 9 x 16 mm (AP x ML x CC series 6, image 17 and series 5, image 46). Hypoattenuation within the left lateral frontal lobe, frontal operculum, and insula. Small chronic infarcts in the posterior left cerebellum, left caudate head, right parietal lobe, and right frontal operculum. No hydrocephalus, extra-axial collection, or effacement of basilar cisterns. Vascular: Calcific atherosclerosis of carotid siphons. Skull: Normal. Negative for fracture or focal lesion. Sinuses/Orbits: Chronic right lamina papyracea fracture. Partial opacification of paranasal sinuses likely due to recent intubation. Other: None. IMPRESSION: 1. Amorphous density in left anterior insula measuring up to 16 mm, probably enhancing infarction, although hemorrhage is possible. Follow-up is recommended to ensure stability. 2. Additional area of hypoattenuation within left lateral frontal lobe, frontal operculum, insula compatible with evolving infarction. 3. Multiple additional stable chronic infarcts as above. These results were called by telephone at the time of interpretation on 12/04/2017 at 5:31 am to Dr. Rory Percy, who verbally acknowledged these results. Electronically Signed   By: Kristine Garbe M.D.   On: 12/04/2017 05:33   Ct Angio Neck W Or Wo Contrast  Result Date: 12/03/2017 CLINICAL DATA:  Code stroke.  Acute onset of right-sided weakness. EXAM: CT ANGIOGRAPHY HEAD AND NECK TECHNIQUE: Multidetector CT imaging of the head and neck was performed using the standard protocol during bolus administration of intravenous contrast. Multiplanar CT image reconstructions and MIPs were obtained to evaluate the vascular anatomy. Carotid stenosis measurements (when applicable) are obtained utilizing NASCET  criteria, using the distal internal carotid diameter as the denominator. CONTRAST:  14mL ISOVUE-370 IOPAMIDOL (ISOVUE-370) INJECTION 76% COMPARISON:  CT head without contrast from the same day at 8:46 a.m. at [facility]. FINDINGS: CT HEAD FINDINGS Brain: Repeat noncontrast CT of the head again demonstrates remote infarcts of the left frontal operculum, left caudate head, and high right parietal lobe. Remote posterior left cerebellar infarcts are again seen. Anterior left insular hypoattenuation may be slightly worse than on the prior exam. The precentral gyrus is intact. Vascular: Atherosclerotic calcifications are again noted within the cavernous internal carotid arteries bilaterally. There is no hyperdense vessel. Skull: Calvarium is intact. No focal lytic or blastic lesions are present. Sinuses: Paranasal sinuses and mastoid air cells are clear. Orbits: Remote blowout fracture is again noted in the right orbit. Right lens replacement is present. Globes and orbits are otherwise within normal limits. Review of the MIP images confirms the above findings CTA NECK FINDINGS Aortic arch: A 3 vessel arch configuration is present. Atherosclerotic calcifications are present at the origins of the great vessels without a significant stenosis. There is no aneurysm. Right carotid system: The right common carotid artery is within  normal limits. Atherosclerotic changes are noted in the proximal right ICA. More distal right internal carotid artery is within normal limits. Left carotid system: The left common carotid artery is within normal limits. Atherosclerotic calcifications are present at the carotid bifurcation proximal left ICA without significant stenosis. There is mild tortuosity of the more distal left ICA without significant stenosis. Vertebral arteries: Atherosclerotic calcifications are present at the origin of the right vertebral artery without significant stenosis. The left vertebral artery  is occluded at its origin. There is very faint reconstitution at the C2-3 level without intraluminal contrast at the foramen magnum. Skeleton: Rightward curvature is present in the lower cervical spine, compensating for leftward curvature in the upper thoracic spine. Other neck: Heterogeneous thyroid is present without a dominant lesion. Patient is intubated. No significant cervical adenopathy is present. Salivary glands are within normal limits. Upper chest: A large right pleural effusion is again seen. The moderate left-sided pleural effusion is present. There is partial collapse of the right upper lobe. Possible soft tissue fullness is present at the right hila. Recommend dedicated CT of the chest with contrast as tolerated. Review of the MIP images confirms the above findings CTA HEAD FINDINGS Anterior circulation: Atherosclerotic calcifications are present within the cavernous internal carotid arteries bilaterally. There is no significant luminal stenosis relative to the more distal vessels through the ICA terminus. The M1 segments are intact bilaterally. No significant proximal occlusion is present on the right. There is moderate attenuation of superior and posterior M2 divisions. The left M1 segment is intact. Proximal superior left M2 and M3 segment is occluded or highly stenotic. The posterior left M2 division is not visualized. Posterior circulation: The right vertebral artery feeds the basilar. PICA origin is visualized. The basilar artery is small centrally terminating at the superior cerebellar arteries. Fetal type posterior cerebral arteries are present bilaterally with moderate distal segmental stenoses but no significant proximal stenosis or occlusion. Venous sinuses: Dural sinuses are patent. Straight sinus and deep cerebral veins are intact. Cortical veins are unremarkable. Anatomic variants: Fetal type posterior cerebral arteries bilaterally. Delayed phase: Postcontrast images are not performed in  the acute setting. Review of the MIP images confirms the above findings IMPRESSION: 1. Proximal posterior division left M2 occlusion. 2. More distal anterior division left M2/M3 occlusion. 3. Extensive intracranial small vessel disease. 4. Occluded left vertebral artery with minimal reconstitution in the neck. 5. Fetal type posterior cerebral arteries bilaterally. 6. Atherosclerotic changes at the carotid bifurcations and cavernous internal carotid arteries bilaterally without other significant stenosis. 7. Aortic Atherosclerosis (ICD10-I70.0). Calcifications involve the great vessel origins without significant stenoses. 8. Large bilateral pleural effusions, right greater than left. 9. Parenchymal volume loss in the right upper lobe likely related to the effusions. Central mass lesion not excluded. 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 Recommend dedicated CT the chest with contrast as the patient's condition allows. 10. These results were called by telephone at the time of interpretation on 12/03/2017 at 11:02 am to Dr. Roland Rack , who verbally acknowledged these results. Electronically Signed   By: San Morelle M.D.   On: 12/03/2017 11:05   Ct Chest Wo Contrast  Addendum Date: 12/04/2017   ADDENDUM REPORT: 12/04/2017 17:04 ADDENDUM: The upper abdominal section of the original report was inadvertently not included and is dictated below. UPPER ABDOMEN: Liver margin appears irregular. Decreased attenuation in the dome is likely due to dilated hepatic veins. Stones are seen in the gallbladder. Probable adrenal thickening. Visualized portions of  the kidneys, spleen, pancreas and stomach are grossly unremarkable. Upper abdominal lymph nodes do not appear enlarged by CT size criteria. Ascites. IMPRESSION: 8. Marginal irregularity of the liver is indicative of cirrhosis. 9.       Cholelithiasis. 10.     Ascites. Electronically Signed   By: Lorin Picket M.D.   On: 12/04/2017 17:04    Result Date: 12/04/2017 CLINICAL DATA:  Pleural effusions. EXAM: CT CHEST WITHOUT CONTRAST TECHNIQUE: Multidetector CT imaging of the chest was performed following the standard protocol without IV contrast. COMPARISON:  Chest radiograph 12/03/2017 and CT chest 01/01/2017. FINDINGS: Cardiovascular: Right IJ central line terminates in the SVC. Atherosclerotic calcification of the arterial vasculature, including coronary arteries. Pulmonary arteries are enlarged. Heart is moderately enlarged with a moderate pericardial effusion. Mediastinum/Nodes: Low internal jugular and mediastinal adenopathy measures up to 1.1 cm in the right paratracheal station. There are calcified mediastinal and bihilar lymph nodes. Distal periesophageal lymph node measures 11 mm, as before. No axillary adenopathy. Lungs/Pleura: Image quality is degraded by respiratory motion. Large right pleural effusion and adjacent collapse/consolidation obscure the majority of the right hemithorax. Nodular areas in the apex of the left upper lobe are grossly stable. Scattered areas of peribronchovascular ground-glass in the left upper lobe, possibly infectious or inflammatory in etiology. Moderate left pleural effusion with collapse/consolidation in the left lower lobe. Endotracheal tube terminates at the orifice of the left mainstem bronchus. Musculoskeletal: Degenerative changes in the spine. No worrisome lytic or sclerotic lesions. IMPRESSION: 1. Endotracheal tube is low lying, at the orifice of the left mainstem bronchus. Pulling back 3-4 cm would likely better position the tip above the carina. 2. Large right pleural effusion with collapse/consolidation in the adjacent right lung. 3. Moderate left pleural effusion with collapse/consolidation in the left lower lobe. 4. Scattered areas of nodularity and ground-glass in the left upper lobe, poorly evaluated due to respiratory motion. 5. Moderate pericardial effusion. 6. Aortic atherosclerosis  (ICD10-170.0). Coronary artery calcification. 7. Enlarged pulmonary arteries, indicative of pulmonary arterial hypertension. Electronically Signed: By: Lorin Picket M.D. On: 12/04/2017 16:47    Ct Head Code Stroke Wo Contrast  Result Date: 12/03/2017 CLINICAL DATA:  Code stroke. Acute onset of unresponsiveness while in the emergency department. Patient is known nonverbal. Acute onset of right-sided weakness. EXAM: CT HEAD WITHOUT CONTRAST TECHNIQUE: Contiguous axial images were obtained from the base of the skull through the vertex without intravenous contrast. COMPARISON:  None. FINDINGS: Brain: No acute infarct, hemorrhage, or mass lesion is present. 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 Hypodense infarct is present in the high right parietal lobe, likely also remote. A remote infarct of the left frontal operculum is present. Associated volume loss is noted. No acute left-sided infarct is present. No acute hemorrhage or mass lesion is present. Remote infarcts are present posteriorly within the left cerebellum. Brainstem is unremarkable. No significant extra-axial fluid collection is present. No significant extra-axial fluid collection is present. Vascular: Atherosclerotic calcifications are present within the internal carotid arteries bilaterally. There is no hyperdense vessel. Skull: Calvarium is intact. No focal lytic or blastic lesions are present. Sinuses/Orbits: The paranasal sinuses are clear. A remote right orbital blowout fracture is present. A right lens replacement is present. Globes and orbits are otherwise within normal limits. ASPECTS [facility] Stroke Program Early CT Score) - Ganglionic level infarction (caudate, lentiform nuclei, internal capsule, insula, M1-M3 cortex): 7/7 - Supraganglionic infarction (M4-M6 cortex): 3/3. Total  score (0-10 with 10 being normal): 10/10. IMPRESSION: 1. Infarcts involving the high  right parietal lobe and left frontal operculum appear remote. 2. Remote lacunar infarct of the left caudate head. 3. Remote infarcts of the posterior left cerebellum. 4. No acute infarct. 5. 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 6. ASPECTS is 10/10 These results were called by telephone at the time of interpretation on 12/03/2017 at 9:03 am to Dr. Blanchie Dessert , who verbally acknowledged these results. Electronically Signed   By: San Morelle M.D.   On: 12/03/2017 09:08    TTE - Left ventricle: The cavity size was below normal. Wall thickness   was increased in a pattern of mild LVH. Systolic function was   vigorous. The estimated ejection fraction was in the range of 65%   to 70%. Wall motion was normal; there were no regional wall   motion abnormalities. Doppler parameters are consistent with   abnormal left ventricular relaxation (grade 1 diastolic   dysfunction). Doppler parameters are consistent with high   ventricular filling pressure. - Ventricular septum: The contour showed diastolic flattening and   systolic flattening. These changes are consistent with RV volume   and pressure overload. - Aortic valve: Mildly calcified annulus. Trileaflet. - Mitral valve: Severely calcified annulus. Severely calcified   leaflets . The findings are consistent with mild stenosis. Mean   gradient (D): 4 mm Hg. Valve area by pressure half-time: 2.12   cm^2. Valve area by continuity equation (using LVOT flow): 0.81   cm^2. - Right ventricle: The cavity size was severely dilated. Pacer wire   or catheter noted in right ventricle. Systolic function was   reduced. - Right atrium: The atrium was severely dilated. Pacer wire or   catheter noted in right atrium. - Tricuspid valve: There was severe regurgitation. - Pulmonic valve: There was mild regurgitation. - Pulmonary arteries: Systolic pressure was severely increased. PA   peak pressure:  71 mm Hg (S). - Inferior vena cava: The CVP could not accurately be assessed as   the patient was ventilated (via IVC assessment). - Pericardium, extracardiac: A moderate to large size pericardial   effusion was noted. Features were not consistent with tamponade   physiology.   PHYSICAL EXAM  Temp:  [97.7 F (36.5 C)-98.7 F (37.1 C)] 97.7 F (36.5 C) (05/10 0400) Pulse Rate:  [79-88] 79 (05/10 0400) Resp:  [15-20] 16 (05/10 0400) BP: (116-144)/(67-75) 127/69 (05/10 0400) SpO2:  [81 %-100 %] 98 % (05/10 0400) Weight:  [185 lb 3 oz (84 kg)] 185 lb 3 oz (84 kg) (05/10 0400)  General - Well nourished, well developed, in no acute distress  Ophthalmologic - fundi not visualized due to noncooperation.  Cardiovascular - Regular rate and rhythm, not in afib.  Neuro - awake, alert, orientated to self, age and place, but not to time, language much improved, able to repeat, and name 2/3, able to follow simple commands, no dysarthria. PERRL, EOMI but incomplete gaze bilaterally, blinking to visual threat bilaterally. Facial symmetric and tongue mid position. Moving all extremities equally and at least 4+/5. DTR 1+ and no babinski. Sensation symmetrical, coordination not cooperative and gait not tested.   ASSESSMENT/PLAN Ms. ANJULI GEMMILL is a 77 y.o. 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 No tPA given due to recent GI bleeding.  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 Contrast  Repeat CT x 2 - left inferior insular small ICH  2D Echo EF 65-70%, severely dilated RA, moderate to large pericardial effusion  LDL 38  HgbA1c 8.6  SCDs for VTE prophylaxis  On diet   No antithrombotic prior to admission, now on on ASA 81mg  after repeat CT with stable hematoma  Ongoing aggressive stroke risk factor management  Therapy recommendations:  CIR  Disposition:  Pending   Hemorrhagic conversion post procedure - left inferior insular  CT repeat x 2 no hematoma enlargement  INR 1.85->1.76->1.53->1.51->1.35 due to cirrhosis   No need to reverse INR this time, as neuro stable and ICH is small and high risk of thromboembolic event  Repeat CT stable hematoma and now on ASA 81mg   Recommend to repeat CT head in 4 weeks at neuro follow up clinic  Respiratory distress due to b/l large pleural effusion, pericardial effusion and sarcoidosis  Bilateral large pleural effusion confirmed on CT  Long hx of sarcoidosis with lung disease  self extubated, tolerating well  On nasal cannular with mild respiratory distress  Plan for thoracentesis with now INR 1.35  Not anticoagulation candidate if need frequent thoracentesis   AFib not on AC but INR 1.7-1.8 baseline  Was on eliquis 5mg  bid with high INR  Stopped in 10/2017 due to concerns of black stool   EGD showed severe portal hypertensive gastropathy  eliquis discontinued  Planned for more GI work up as outpt  Currently not on AC due to anemia and ICH as well as elevated INR  Rate controlled   Given severe PHG, GIB, elevated INR, needs of frequent thoracentesis, current hemorrhagic infarct and anemia, not good candidate for anticoagulation at this time.   Need to repeat  CT head and follow up with stroke clinic in 4 weeks to decide on anticoagulation   Right heart CHF  TTE EF 65-70%, severely dilated RA, moderate to large pericardial effusion  CCM on board  CT showed large pleural effusion  Self extubated and tolerating well so far  Consider cardiology consult if needed  Alcoholic liver cirrhosis  AST/ALT WNL  INR high in 10/2017  INR 1.85->1.76->1.53->1.51->1.35  EGD showed severe PHG   Recent GIB in 10/2017 due to severe PHG - GI following as outpt  No antithrombotics PTA   Anemia   Hb 10.1->9.1->8.3->8.0->9.2->8.4  Close monitoring  Pending stool occult blood  Likely due to recent GIB, IR procedure, and PHG  Consider PRBC if needed  CKD, improving  Cre 2.04->1.98->1.43->1.32->1.36-> 1.53->1.51  Off IVF  I/O monitoring  Diabetes  HgbA1c 8.6 goal < 7.0  Uncontrolled  CBG monitoring - glucose stable  SSI  DM education and close PCP follow up  Hypotension, stable on levophed  BP stable  off levophed  BP goal 110-140   Other Stroke Risk Factors  Advanced age  ETOH  use  Other Active Problems  Pacemaker present  Left breast cancer in situ on tamoxifen   Hospital day # 6  Neurology will sign off. Please call with questions. Pt will follow up with Dr. Leonie Man at [facility] in about 4 weeks. Thanks for the consult.  Rosalin Hawking, MD PhD Stroke Neurology 12/10/2017 12:18 AM    To contact Stroke Continuity provider, please refer to [email or web address] After hours, contact General Neurology

## 2017-12-09 NOTE — Progress Notes (Signed)
Physical Therapy Treatment Patient Details Name: Ana Gardner MRN: [id number] DOB: [birth year] Today's Date: 12/09/2017    History of Present Illness 77 y.o. 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 No tPA given due to recent GI bleeding.  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    PT Comments    Patient agreeable to participate in therapy this session. Daughter present upon arrival and left during session. Pt demonstrated increased activity tolerance and without frequent mood changes this session. Pt does continue to demonstrate cognitive and mobility impairments increasing risk for falls. Current plan remains appropriate.    Follow Up Recommendations  CIR     Equipment Recommendations  Other (comment)(TBA)    Recommendations for Other Services Rehab consult     Precautions / Restrictions Precautions Precautions: Fall Restrictions Weight Bearing Restrictions: Yes    Mobility  Bed Mobility               General bed mobility comments: pt OOB in chair upon arrival   Transfers Overall transfer level: Needs assistance Equipment used: Rolling walker (2 wheeled) Transfers: Sit to/from Stand Sit to Stand: Min assist;+2 safety/equipment;From elevated surface         General transfer comment: +2 for safety; pt has tendency to sit abruptly at times; cues for safe hand placement; pt demonstrated carry over of hand placement throughout session   Ambulation/Gait Ambulation/Gait assistance: Min assist;+2 safety/equipment(chair follow ) Ambulation Distance (Feet): (~61ft bouts X3) Assistive device: Rolling walker (2 wheeled) Gait  Pattern/deviations: Step-through pattern;Trunk flexed;Decreased stride length     General Gait Details: cues for breathing technique and safe use of AD; pt with decreased cadence    Stairs             Wheelchair Mobility    Modified Rankin (Stroke Patients Only) Modified Rankin (Stroke Patients Only) Pre-Morbid Rankin Score: No symptoms Modified Rankin: Moderately severe disability     Balance Overall balance assessment: Needs assistance Sitting-balance support: Feet supported Sitting balance-Leahy Scale: Fair     Standing balance support: During functional activity;Bilateral upper extremity supported Standing balance-Leahy Scale: Poor                              Cognition Arousal/Alertness: Awake/alert Behavior During Therapy: WFL for tasks assessed/performed(impulsive to sit ) Overall Cognitive Status: Impaired/Different from baseline Area of Impairment: Orientation;Attention;Memory;Following commands;Safety/judgement;Awareness;Problem solving                 Orientation Level: Disoriented to;Situation;Place Current Attention Level: Focused Memory: Decreased short-term memory Following Commands: Follows one step commands inconsistently;Follows one step commands with increased time Safety/Judgement: Decreased awareness of safety;Decreased awareness of deficits Awareness: Intellectual Problem Solving: Slow processing;Decreased initiation;Difficulty sequencing;Requires verbal cues General Comments: perseveration on pursed lip breathing sequencing after being taught by therapist; pt stated today is Friday and that Friday is May 10th however reported that today is the 12th      Exercises      General Comments General comments (skin integrity, edema, etc.): hard to get accurate SpO2 reading however readings were upper 80s and low 90s; pt SOB with mobility and 2L O2 via [place] donned end of session  Pertinent Vitals/Pain Pain Assessment:  No/denies pain    Home Living                      Prior Function            PT Goals (current goals can now be found in the care plan section) Acute Rehab PT Goals PT Goal Formulation: With family Time For Goal Achievement: 12/18/17 Potential to Achieve Goals: Good Progress towards PT goals: Progressing toward goals    Frequency    Min 4X/week      PT Plan Current plan remains appropriate    Co-evaluation PT/OT/SLP Co-Evaluation/Treatment: Yes Reason for Co-Treatment: Necessary to address cognition/behavior during functional activity;For patient/therapist safety;To address functional/ADL transfers PT goals addressed during session: Mobility/safety with mobility;Proper use of DME;Strengthening/ROM        AM-PAC PT "6 Clicks" Daily Activity  Outcome Measure  Difficulty turning over in bed (including adjusting bedclothes, sheets and blankets)?: A Lot Difficulty moving from lying on back to sitting on the side of the bed? : Unable Difficulty sitting down on and standing up from a chair with arms (e.g., wheelchair, bedside commode, etc,.)?: Unable Help needed moving to and from a bed to chair (including a wheelchair)?: A Little Help needed walking in hospital room?: A Little Help needed climbing 3-5 steps with a railing? : A Lot 6 Click Score: 12    End of Session Equipment Utilized During Treatment: Oxygen;Gait belt Activity Tolerance: Patient tolerated treatment well Patient left: in chair;with call bell/phone within reach;with chair alarm set Nurse Communication: Mobility status PT Visit Diagnosis: Other symptoms and signs involving the nervous system (R29.898)     Time: 1610-9604 PT Time Calculation (min) (ACUTE ONLY): 41 min  Charges:  $Gait Training: 23-37 mins                    G Codes:       Earney Navy, PTA Pager: [phone]     Darliss Cheney 12/09/2017, 1:27 PM

## 2017-12-09 NOTE — Progress Notes (Signed)
Occupational Therapy Treatment Patient Details Name: Ana Gardner MRN: [id number] DOB: 10/25/1940 Today's Date: 12/09/2017    History of present illness 77 y.o. 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 No tPA given due to recent GI bleeding.  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   OT comments  Pt progressing towards OT goals, completing room and hallway functional mobility using RW with MinA, +2 for safety as pt easily fatigues and at times is impulsive to sit, requires seated rest breaks throughout session. Pt completing seated and standing grooming ADLs with MinGuard-MinA; continues to demonstrate cognitive impairments, perseverating on deep breathing techniques after education provided by therapist and requiring multimodal cues to follow certain commands. Feel POC remains appropriate candidate for CIR level services. Will continue to follow acutely to progress pt towards established OT goals.    Follow Up Recommendations  CIR    Equipment Recommendations  None recommended by OT         Precautions / Restrictions Precautions Precautions: Fall Restrictions Weight Bearing Restrictions: No       Mobility Bed Mobility               General bed mobility comments: pt OOB in chair upon arrival   Transfers Overall transfer level: Needs assistance Equipment used: Rolling walker (2 wheeled) Transfers: Sit to/from Stand Sit to Stand: Min assist;+2 safety/equipment;From elevated surface         General transfer comment: +2 for safety; pt has tendency to sit abruptly at times; cues for safe hand placement; pt demonstrated carry  over of hand placement throughout session     Balance Overall balance assessment: Needs assistance Sitting-balance support: Feet supported Sitting balance-Leahy Scale: Fair     Standing balance support: During functional activity;Bilateral upper extremity supported Standing balance-Leahy Scale: Poor Standing balance comment: min assist for stability in standing due to extensive posterior lean                           ADL either performed or assessed with clinical judgement   ADL Overall ADL's : Needs assistance/impaired Eating/Feeding: Supervision/ safety;Sitting   Grooming: Wash/dry hands;Wash/dry face;Set up;Sitting;Min guard;Minimal assistance;Standing Grooming Details (indicate cue type and reason): pt washing face with setup assist while sitting in chair at sink; pt stood at sink to wash hands with minguard, intermittent min steadying assist in standing                             Functional mobility during ADLs: Minimal assistance;+2 for safety/equipment;Cueing for sequencing General ADL Comments: pt completing room and hallway functional mobility, requires multiple rest breaks during session due to fatigue; educated pt in deep breathing techniques with pt having difficulty fully understanding and completing, difficulty obtaining clear SpO2 reading using both portable pulse ox and dinamap, though when able to obtain clear reading O2 sats remaining in high 80's-low 90's on RA, 2L O2 applied end of session                        Cognition Arousal/Alertness: Awake/alert Behavior During Therapy: [facility] for tasks assessed/performed(impulsive to sit ) Overall  Cognitive Status: Impaired/Different from baseline Area of Impairment: Orientation;Attention;Memory;Following commands;Safety/judgement;Awareness;Problem solving                 Orientation Level: Disoriented to;Situation;Place Current Attention Level: Focused Memory: Decreased short-term  memory Following Commands: Follows one step commands inconsistently;Follows one step commands with increased time Safety/Judgement: Decreased awareness of safety;Decreased awareness of deficits Awareness: Intellectual Problem Solving: Slow processing;Decreased initiation;Difficulty sequencing;Requires verbal cues General Comments: perseveration on pursed lip breathing sequencing after being taught by therapist; pt stated today is Friday and that Friday is May 10th however reported that today is the 12th                    General Comments hard to get accurate SpO2 reading however readings were upper 80s and low 90s; pt SOB with mobility and 2L O2 via [place] donned end of session    Pertinent Vitals/ Pain       Pain Assessment: No/denies pain                                                          Frequency  Min 2X/week        Progress Toward Goals  OT Goals(current goals can now be found in the care plan section)  Progress towards OT goals: Progressing toward goals  Acute Rehab OT Goals Patient Stated Goal: to be independent via daughter Time For Goal Achievement: 12/19/17 Potential to Achieve Goals: Good  Plan Discharge plan remains appropriate    Co-evaluation    PT/OT/SLP Co-Evaluation/Treatment: Yes Reason for Co-Treatment: Necessary to address cognition/behavior during functional activity;For patient/therapist safety;To address functional/ADL transfers PT goals addressed during session: Mobility/safety with mobility;Proper use of DME;Strengthening/ROM OT goals addressed during session: ADL's and self-care;Proper use of Adaptive equipment and DME      AM-PAC PT "6 Clicks" Daily Activity     Outcome Measure   Help from another person eating meals?: A Little Help from another person taking care of personal grooming?: A Lot Help from another person toileting, which includes using toliet, bedpan, or urinal?: A Lot Help from another person  bathing (including washing, rinsing, drying)?: A Lot Help from another person to put on and taking off regular upper body clothing?: A Lot Help from another person to put on and taking off regular lower body clothing?: A Lot 6 Click Score: 13    End of Session Equipment Utilized During Treatment: Gait belt;Oxygen  OT Visit Diagnosis: Unsteadiness on feet (R26.81);Cognitive communication deficit (R41.841);Hemiplegia and hemiparesis Symptoms and signs involving cognitive functions: Cerebral infarction Hemiplegia - Right/Left: Right Hemiplegia - dominant/non-dominant: Dominant Hemiplegia - caused by: Cerebral infarction   Activity Tolerance Patient tolerated treatment well   Patient Left in chair;with call bell/phone within reach;with chair alarm set   Nurse Communication Mobility status        Time: 2353-6144 OT Time Calculation (min): 41 min  Charges: OT Treatments $Self Care/Home Management : 8-22 mins  Lou Cal, OT Pager [phone] 12/09/2017    Raymondo Band 12/09/2017, 2:04 PM

## 2017-12-09 NOTE — Procedures (Signed)
PROCEDURE SUMMARY:  Successful US guided diagnostic and therapeutic right thoracentesis. Yielded 1.1 liters of amber fluid. Pt tolerated procedure fair.  Procedure was stopped prior to removal of all fluid due to patient discomfort and cough.  No immediate complications.  Specimen was sent for labs. CXR ordered.  Docia Barrier PA-C 12/09/2017 5:28 PM

## 2017-12-09 NOTE — NC FL2 (Signed)
[place] LEVEL OF CARE SCREENING TOOL     IDENTIFICATION  Patient Name: Ana Gardner Birthdate: 25-Mar-1941 Sex: female Admission Date (Current Location): 12/03/2017  [facility] and [place] Number:  Herbalist and Address:  [facility] [place], [place] [address], [place], [place] [address]      Provider Number: [id number]  Attending Physician Name and Address:  Tawni Millers  Relative Name and Phone Number:       Current Level of Care: Hospital Recommended Level of Care: [place] Prior Approval Number:    Date Approved/Denied:   PASRR Number: [id number]  Discharge Plan: SNF    Current Diagnoses: Patient Active Problem List   Diagnosis Date Noted  . Anemia   . Elevated INR   . Alcoholic cirrhosis of liver with ascites ([place])   . Paroxysmal atrial fibrillation (HCC)   . Stroke ([place]) 12/03/2017  . Acute CVA (cerebrovascular accident) ([place]) 12/03/2017  . Stroke (cerebrum) ([place]) 12/03/2017  . Middle cerebral artery embolism, left 12/03/2017  . Acute on chronic congestive heart failure ([place])   . Acute respiratory failure with hypoxia ([place])   . Pleural effusion, bilateral     Orientation RESPIRATION BLADDER Height & Weight     Self, Place  O2([place] 2L) Continent Weight: 185 lb 3 oz (84 kg) Height:  _0  (167.6 cm)  BEHAVIORAL SYMPTOMS/MOOD NEUROLOGICAL BOWEL NUTRITION STATUS      Continent Diet(carb modified)  AMBULATORY STATUS COMMUNICATION OF NEEDS Skin   Limited Assist Verbally Other (Comment)(venous stasis ulcer, pretibial left, foam dressing)                       Personal Care Assistance Level of Assistance  Bathing, Feeding, Dressing Bathing Assistance: Limited assistance Feeding assistance: Independent Dressing Assistance: Limited assistance     Functional Limitations Info  Sight, Hearing, Speech Sight Info: Adequate Hearing Info: Adequate Speech Info: Impaired(slurred/dysarthria)     SPECIAL CARE FACTORS FREQUENCY  PT (By licensed PT), OT (By licensed OT)     PT Frequency: 5x/wk OT Frequency: 5x/wk            Contractures Contractures Info: Not present    Additional Factors Info  Code Status, Allergies, Insulin Sliding Scale Code Status Info: Full Allergies Info: None on file   Insulin Sliding Scale Info: 0-15 units, 3x/day with meals       Current Medications (12/09/2017):  This is the current hospital active medication list Current Facility-Administered Medications  Medication Dose Route Frequency Provider Last Rate Last Dose  . 0.9 %  sodium chloride infusion  250 mL Intravenous PRN Aldean Jewett, MD   Stopped at 12/07/17 0600  . 0.9 %  sodium chloride infusion   Intra-arterial PRN Deterding, Guadelupe Sabin, MD      . acetaminophen (TYLENOL) tablet 650 mg  650 mg Oral Q4H PRN Luanne Bras, MD       Or  . acetaminophen (TYLENOL) solution 650 mg  650 mg Per Tube Q4H PRN Deveshwar, Sanjeev, MD       Or  . acetaminophen (TYLENOL) suppository 650 mg  650 mg Rectal Q4H PRN Deveshwar, Sanjeev, MD      . aspirin EC tablet 81 mg  81 mg Oral Daily Rosalin Hawking, MD   81 mg at 12/09/17 1005  . chlorhexidine gluconate (MEDLINE KIT) (PERIDEX) 0.12 % solution 15 mL  15 mL Mouth Rinse BID Aldean Jewett, MD   15  mL at 12/08/17 0928  . docusate (COLACE) 50 MG/5ML liquid 100 mg  100 mg Oral BID PRN Desai, Rahul P, PA-C      . feeding supplement (ENSURE ENLIVE) (ENSURE ENLIVE) liquid 237 mL  237 mL Oral BID BM Arrien, Jimmy Picket, MD   237 mL at 12/09/17 1008  . furosemide (LASIX) tablet 40 mg  40 mg Oral Daily Arrien, Jimmy Picket, MD   40 mg at 12/09/17 1005  . insulin aspart (novoLOG) injection 0-15 Units  0-15 Units Subcutaneous TID WC Aldean Jewett, MD   2 Units at 12/09/17 0756  . MEDLINE mouth rinse  15 mL Mouth Rinse BID Aldean Jewett, MD   15 mL at 12/09/17 1008  . pantoprazole (PROTONIX) EC tablet 40 mg  40 mg Oral Daily Sivakumar, Siva P, MD    40 mg at 12/09/17 1005  . sodium chloride flush (NS) 0.9 % injection 10-40 mL  10-40 mL Intracatheter Q12H Ollis, Brandi L, NP   10 mL at 12/09/17 1006  . sodium chloride flush (NS) 0.9 % injection 10-40 mL  10-40 mL Intracatheter PRN Donita Brooks, NP         Discharge Medications: Please see discharge summary for a list of discharge medications.  Relevant Imaging Results:  Relevant Lab Results:   Additional Information SS#: [id number]  Geralynn Ochs, LCSW

## 2017-12-09 NOTE — Progress Notes (Signed)
Patient brought to IR for possible thoracentesis.   Discussed procedure including risks with patient.  She is agreeable and oriented to person and place, however is not able to accept risks of the procedure and is unable to restate what we are doing for her despite prompting and multiple redirections-- "going to take my blood and send it to rehab."  Patient does have a daughter who has been involved in medical decisions, however upon review of chart, no number can be found.  Patient states her daughter is at the hospital, however RN on unit says no family has been by to see patient.  Question whether patient recently got medication which interfering with her consentability at this time as she appears to have had moments of clarity despite recent CVA and residual cognitive communication deficits.  She is currently comfortable on [place]. Not in respiratory distress.   Patient returned to unit at this time.  Will continue to work towards consent.   Brynda Greathouse, MS RD PA-C 1:58 PM

## 2017-12-09 NOTE — Progress Notes (Signed)
PROGRESS NOTE    Ana Gardner  SAY:[id number] DOB: [birth year] DOA: 12/03/2017 PCP: Karleen Hampshire., MD    Brief Narrative:  77 year old female who presented with right-sided weakness.  She does have a significant past medical history of sarcoidosis, right-sided heart failure, Mobitz 2 AV block, chronic atrial fibrillation, type 2 diabetes mellitus, dyslipidemia, chronic kidney disease stage III, hypertension and nonalcoholic liver cirrhosis.  Patient developed acute aphasia, with right-sided hemiplegia and left gaze preference, while being and [facility] emergency department.  She initially presented to the emergency room due to worsening dyspnea and edema, apparently she had a recent hospitalization for decompensated heart and liver failure, that required thoracentesis and aggressive diuresis.   Patient was unable to protect her airway, and she was placed on invasive mechanical ventilation.  CT angiography showed proximal posterior division left M2/M3 and left vertebral artery occlusion.  Patient underwent revascularization of the left MCA.   On her initial physical examination blood pressure was 128/84, heart rate 88, temperature 98, respiratory 22, oxygen saturation 99% on invasive mechanical ventilation.  Moist mucous membranes, lungs clear to auscultation bilaterally, heart S1-S2 present and rhythmic, 3-6 systolic ejection murmur, abdomen was soft nontender, 1+ bilateral lower extremity edema.  Sodium 122, potassium 4.2, chloride 94, bicarb 31, glucose 137, BUN 51, creatinine 2.0, BNP 381, white count 7.1, hemoglobin 10.1, hematocrit 28.6, platelets 432.  Urinalysis negative for infection.  Urine drug screen positive for benzodiazepines.  Her chest x-ray showed a large right pleural effusion.  EKG showed atrial pacing, V sensing, right bundle branch block. Positive delayed between a paced and ventricular response, rate 82 bmp.   Patient was admitted to the ICU due to acute ischemic CVA complicated  by acute hypoxemic respiratory failure.    Assessment & Plan:   Active Problems:   Stroke [facility])   Acute CVA (cerebrovascular accident) ([place])   Stroke (cerebrum) (HCC)   Pleural effusion, bilateral   Middle cerebral artery embolism, left   Paroxysmal atrial fibrillation (HCC)   Anemia   Elevated INR   Alcoholic cirrhosis of liver with ascites ([place])   1. Acute ischemic CVA/ left MCA infarct/ embolic, complicated with hemorrhagic conversion at the left insular. Continue with aspirin therapy for now, neurologically continue to be stable. CT angiography with left MCA occlusions, left vertebral artery occulusion. Follow up head CT May 8 with stable hemorhage at the insula. Echocardiogram with LV EF 65 to 70%.  Case discussed with Dr. Erlinda Hong will hold full anticoagulation for now until follow up as outpatient with the neurology clinic. Patient is high bleeding riks.   2. Acute hypoxic respiratory failure due to right sided heart failure acute on chronic, severe pulmonary hypertension,complicated by right side pleural effusion. (self extubation 5/7). Severe RV dilatation, and significant reduction in systolic function, interventricular septum flattening in systole and diastole, peak PA pressure is 71 mmHG. Patient with poor prognosis due to severe pulmonary hypertension, will continue gentle diuresis due to pre-load dependence, will need strict in and out. For now will continue with daily furosemide 40 mg daily. Plan for right thoracentesis. Continue oxymetry monitoring and supplemental 02 per [place].   3. Atrial fibrillation chronic. Pacer rhythm, with controlled rate. Continue with aspiring for now. Rate continue to be controlled.   4. Alcoholic liver cirrhosis' cardiac cirrhosis. No encephalopathy, INR at 1,35, will continue conservative medical therapy. Follow INR in am.   5. AKI with hyponatremia. Stable renal function with serum cr at 1,51, with K at 4,6 and  serum bicarbonate at 22, will continue  furosemide 40 mg daily. Follow urine output. Patient at high risk for hepatorenal syndrome. Poor prognosis.  6. T2DM.  Glucose cover and monitoring with insulin sliding scale, capillary glucose 203, 156, 150, 221. Patient is tolerating po well.    DVT prophylaxis: enoxaparin   Code Status:  full Family Communication: I spoke with patient's daughter at the bedside and all questions were addressed.  Disposition Plan: may need snf when stable.    Consultants:   Neurology   Procedures:     Antimicrobials:     Subjective: This am patient is feeling better, not yet back to her baseline, persistent orthopnea, no chest pain.   Objective: Vitals:   12/08/17 1617 12/08/17 2000 12/08/17 2348 12/09/17 0400  BP: 131/75 116/70 121/67 127/69  Pulse: 79 88 81 79  Resp: '15 20  16  '$ Temp:  98.7 F (37.1 C) 97.8 F (36.6 C) 97.7 F (36.5 C)  TempSrc:  Axillary Oral Oral  SpO2: (!) 81% 94% 100% 98%  Weight:    84 kg (185 lb 3 oz)  Height:       No intake or output data in the 24 hours ending 12/09/17 1118 Filed Weights   12/06/17 0500 12/08/17 0400 12/09/17 0400  Weight: 82.3 kg (181 lb 7 oz) 84.1 kg (185 lb 6.5 oz) 84 kg (185 lb 3 oz)    Examination:   General: deconditioned  Neurology: Awake and alert, non focal  E ENT: mild pallor, no icterus, oral mucosa moist Cardiovascular: No JVD. S1-S2 present, rhythmic, no gallops, rubs, or murmurs. Trace lower extremity edema. Pulmonary: decreased breath sounds at the right base, decreased air movement, no wheezing, rhonchi or rales. Gastrointestinal. Abdomen with no organomegaly, non tender, no rebound or guarding Skin. No rashes Musculoskeletal: no joint deformities     Data Reviewed: I have personally reviewed following labs and imaging studies  CBC: Recent Labs  Lab 12/03/17 0909  12/04/17 0417 12/06/17 0431 12/07/17 0334 12/08/17 1258 12/09/17 0450  WBC 7.1  --  19.0* 16.1* 10.1 8.3 7.9  NEUTROABS 4.5  --   15.9*  --   --   --   --   HGB 10.1*   < > 9.1* 8.3* 8.0* 9.2* 8.4*  HCT 28.6*   < > 28.1* 26.1* 25.5* 28.5* 25.6*  MCV 68.1*  --  69.7* 69.6* 69.7* 69.9* 69.4*  PLT 432*  --  408* 313 306 315 352   < > = values in this interval not displayed.   Basic Metabolic Panel: Recent Labs  Lab 12/05/17 0450 12/05/17 1854 12/06/17 0431 12/07/17 0334 12/08/17 1258 12/09/17 0450  NA 134*  --  136 139 138 134*  K 3.6  --  4.2 4.5 4.7 4.6  CL 101  --  103 106 103 100*  CO2 24  --  '24 26 22 22  '$ GLUCOSE 163*  --  225* 169* 160* 154*  BUN 26*  --  24* 25* 30* 29*  CREATININE 1.43*  --  1.32* 1.36* 1.53* 1.51*  CALCIUM 8.2*  --  8.4* 8.7* 9.1 8.9  MG 1.8 1.8 2.0 1.9  --   --   PHOS  --  3.5 3.7 3.6  --   --    GFR: Estimated Creatinine Clearance: 35.2 mL/min (A) (by C-G formula based on SCr of 1.51 mg/dL (H)). Liver Function Tests: Recent Labs  Lab 12/03/17 0909 12/09/17 0450  AST 19  --   ALT  10*  --   ALKPHOS 48  --   BILITOT 1.0  --   PROT 5.8* 6.9  ALBUMIN 2.8*  --    No results for input(s): LIPASE, AMYLASE in the last 168 hours. No results for input(s): AMMONIA in the last 168 hours. Coagulation Profile: Recent Labs  Lab 12/04/17 2046 12/05/17 0450 12/06/17 0431 12/07/17 0334 12/09/17 0450  INR 1.85 1.76 1.53 1.51 1.35   Cardiac Enzymes: Recent Labs  Lab 12/03/17 0909  TROPONINI <0.03   BNP (last 3 results) No results for input(s): PROBNP in the last 8760 hours. HbA1C: No results for input(s): HGBA1C in the last 72 hours. CBG: Recent Labs  Lab 12/08/17 0625 12/08/17 1138 12/08/17 1629 12/08/17 2151 12/09/17 0601  GLUCAP 112* 144* 203* 156* 150*   Lipid Profile: No results for input(s): CHOL, HDL, LDLCALC, TRIG, CHOLHDL, LDLDIRECT in the last 72 hours. Thyroid Function Tests: No results for input(s): TSH, T4TOTAL, FREET4, T3FREE, THYROIDAB in the last 72 hours. Anemia Panel: No results for input(s): VITAMINB12, FOLATE, FERRITIN, TIBC, IRON, RETICCTPCT  in the last 72 hours.    Radiology Studies: I have reviewed all of the imaging during this hospital visit personally     Scheduled Meds: . aspirin EC  81 mg Oral Daily  . chlorhexidine gluconate (MEDLINE KIT)  15 mL Mouth Rinse BID  . feeding supplement (ENSURE ENLIVE)  237 mL Oral BID BM  . furosemide  40 mg Oral Daily  . insulin aspart  0-15 Units Subcutaneous TID WC  . mouth rinse  15 mL Mouth Rinse BID  . pantoprazole  40 mg Oral Daily  . sodium chloride flush  10-40 mL Intracatheter Q12H   Continuous Infusions: . sodium chloride Stopped (12/07/17 0600)  . sodium chloride       LOS: 6 days        Tawni Millers, MD [facility] Pager [phone]

## 2017-12-10 DIAGNOSIS — I6389 Other cerebral infarction: Secondary | ICD-10-CM

## 2017-12-10 LAB — GLUCOSE, CAPILLARY
GLUCOSE-CAPILLARY: 143 mg/dL — AB (ref 65–99)
GLUCOSE-CAPILLARY: 121 mg/dL — AB (ref 65–99)
GLUCOSE-CAPILLARY: 188 mg/dL — AB (ref 65–99)
Glucose-Capillary: 113 mg/dL — ABNORMAL HIGH (ref 65–99)
Glucose-Capillary: 207 mg/dL — ABNORMAL HIGH (ref 65–99)

## 2017-12-10 LAB — BASIC METABOLIC PANEL
ANION GAP: 8 (ref 5–15)
BUN: 28 mg/dL — ABNORMAL HIGH (ref 6–20)
CHLORIDE: 101 mmol/L (ref 101–111)
CO2: 24 mmol/L (ref 22–32)
CREATININE: 1.38 mg/dL — AB (ref 0.44–1.00)
Calcium: 8.5 mg/dL — ABNORMAL LOW (ref 8.9–10.3)
GFR calc non Af Amer: 36 mL/min — ABNORMAL LOW (ref >60)
GFR, EST AFRICAN AMERICAN: 42 mL/min — AB (ref >60)
Glucose, Bld: 130 mg/dL — ABNORMAL HIGH (ref 65–99)
POTASSIUM: 5.7 mmol/L — AB (ref 3.5–5.1)
SODIUM: 133 mmol/L — AB (ref 135–145)

## 2017-12-10 LAB — CBC
HCT: 25.3 % — ABNORMAL LOW (ref 36.0–46.0)
HEMOGLOBIN: 8.4 g/dL — AB (ref 12.0–15.0)
MCH: 23 pg — AB (ref 26.0–34.0)
MCHC: 33.2 g/dL (ref 30.0–36.0)
MCV: 69.3 fL — ABNORMAL LOW (ref 78.0–100.0)
Platelets: 361 10*3/uL (ref 150–400)
RBC: 3.65 MIL/uL — AB (ref 3.87–5.11)
RDW: 19.6 % — AB (ref 11.5–15.5)
WBC: 7.9 10*3/uL (ref 4.0–10.5)

## 2017-12-10 MED ORDER — MENTHOL 3 MG MT LOZG
1.0000 | LOZENGE | OROMUCOSAL | Status: DC | PRN
  Filled 2017-12-10: qty 9

## 2017-12-10 NOTE — Progress Notes (Signed)
PROGRESS NOTE    Ana Gardner  TFT:[id number] DOB: 1940/08/14 DOA: 12/03/2017 PCP: Karleen Hampshire., MD    Brief Narrative:  77 year old female who presented with right-sided weakness. She does have a significant past medical history of sarcoidosis, right-sided heart failure, Mobitz 2 AV block, chronic atrial fibrillation, type 2 diabetes mellitus, dyslipidemia, chronic kidney disease stage III, hypertension and nonalcoholic liver cirrhosis. Patient developed acute aphasia, with right-sided hemiplegia and left gaze preference,while being and [facility] emergency department.She initially presented to the emergency room due to worsening dyspnea and edema,apparently she had a recent hospitalization for decompensated heart and liver failure,that required thoracentesis and aggressive diuresis.  Patient was unable to protect her airway,and she was placed on invasive mechanical ventilation. CT angiography showed proximal posterior division left M2/M3 and left vertebral artery occlusion. Patient underwent revascularization of the left MCA.  On her initial physical examination blood pressure was 128/84, heart rate 88, temperature 98, respiratory 22, oxygen saturation 99% on invasive mechanical ventilation. Moist mucous membranes, lungs clear to auscultation bilaterally, heart S1-S2 present and rhythmic, 3-6 systolic ejection murmur, abdomen was soft nontender, 1+ bilateral lower extremity edema.Sodium 122, potassium 4.2, chloride 94, bicarb 31, glucose 137, BUN 51, creatinine 2.0,BNP 381, white count 7.1, hemoglobin 10.1, hematocrit 28.6, platelets 432.Urinalysis negative for infection. Urine drug screen positive for benzodiazepines. Her chest x-ray showed a large right pleural effusion.EKG showed atrial pacing, V sensing, right bundle branch block. Positive delayed between a paced and ventricular response, rate 82 bmp.   Patient was admitted to the ICU due to acute ischemic CVA complicated  by acute hypoxemic respiratory failure.  Patient has an prolonged hospital course, self extubated and transferred to the medical ward.  Neurologically and respiratory improving.   Assessment & Plan:   Active Problems:   Stroke [facility])   Acute CVA (cerebrovascular accident) ([place])   Stroke (cerebrum) (HCC)   Recurrent right pleural effusion   Middle cerebral artery embolism, left   Paroxysmal atrial fibrillation (HCC)   Anemia   Elevated INR   Alcoholic cirrhosis of liver with ascites ([place])   Acute hemorrhagic infarction of brain ([place])   1. Acute ischemic CVA/ left MCA infarct/ embolic, complicated with hemorrhagic conversion at the left insular.Patient qualifies for CIR, social services consulted. Will continue antiplatelet therapy with asa due to high risk of bleeding. Neurologically continue to be stable. Will need repeat CT in 4 weeks.   2. Acute hypoxic respiratory failure due to right sided heart failure acute on chronic,severe pulmonary hypertension,complicated by right side pleural effusion. (self extubation 5/7). Severe RV dilatation, and significant reduction in systolic function, interventricular septum flattening in systole and diastole, peak PA pressure is 71 mmHG. Sp thoracentesis, removed 1,100 ml of transudate fluid per Light's criteria. Will continue gentle diuresis, oxymetry monitoring and supplemental 02 per [place]. Physical therapy at CIR. Patient with poor prognosis due to sever pulmonary hypertension, suspected to be sarcoid related or porto-pulmonary hypertension.   3. Atrial fibrillation chronic.Pacer rhythm, with controlled rate. Aspiring 81 mg with good toleration.   4. Alcoholic liver cirrhosis' cardiac cirrhosis. Coagulopathy, but no encephalopathy or significant ascites, will continue supportive medical therapy. Diuresis as tolerated.  5. AKI with hyponatremia. Serum cr at 1,38, with K at 5,7 and serum bicarbonate at 24. Tolerating well furosemide 40 mg daily.  Follow urine output over last 24 hours 1300 ml.  6. T2DM. Patient tolerating po well, will continue glucose cover and monitoring with insulin sliding scale, capillary glucose 221, 241, 143,  113, 121.    DVT prophylaxis:enoxaparin Code Status:full Family Communication:no family at the bedside.  Disposition Plan:may need snf when stable.   Consultants:  Neurology  Procedures:    Antimicrobials:    Subjective: Dyspnea has improved after thoracentesis, no nausea or vomiting no chest pain. Tolerating po well.   Objective: Vitals:   12/09/17 1959 12/10/17 0030 12/10/17 0409 12/10/17 0916  BP: 130/75 (!) 145/82 108/60 123/75  Pulse: 80 85 79 81  Resp: _0 Temp: 97.6 F (36.4 C) 98 F (36.7 C) 97.7 F (36.5 C) (!) 97.5 F (36.4 C)  TempSrc: Oral Oral Oral Oral  SpO2: 98% 99% 98% 100%  Weight:   84.5 kg (186 lb 4.6 oz)   Height:        Intake/Output Summary (Last 24 hours) at 12/10/2017 1018 Last data filed at 12/10/2017 0900 Gross per 24 hour  Intake 240 ml  Output 1100 ml  Net -860 ml   Filed Weights   12/08/17 0400 12/09/17 0400 12/10/17 0409  Weight: 84.1 kg (185 lb 6.5 oz) 84 kg (185 lb 3 oz) 84.5 kg (186 lb 4.6 oz)    Examination:   General: Not in pain or dyspnea, deconditined Neurology: Awake and alert, non focal  E ENT: no pallor, no icterus, oral mucosa moist Cardiovascular: No JVD. S1-S2 present, rhythmic, no gallops, rubs, or murmurs. +/++ pitting bilateral lower extremity edema. Pulmonary: decreased breath sounds at the right base,  no wheezing, rhonchi or rales. Gastrointestinal. Abdomen protuberant with no organomegaly, non tender, no rebound or guarding Skin. No rashes Musculoskeletal: no joint deformities     Data Reviewed: I have personally reviewed following labs and imaging studies  CBC: Recent Labs  Lab 12/04/17 0417 12/06/17 0431 12/07/17 0334 12/08/17 1258 12/09/17 0450 12/10/17 0636  WBC 19.0*  16.1* 10.1 8.3 7.9 7.9  NEUTROABS 15.9*  --   --   --   --   --   HGB 9.1* 8.3* 8.0* 9.2* 8.4* 8.4*  HCT 28.1* 26.1* 25.5* 28.5* 25.6* 25.3*  MCV 69.7* 69.6* 69.7* 69.9* 69.4* 69.3*  PLT 408* 313 306 315 352 568   Basic Metabolic Panel: Recent Labs  Lab 12/05/17 0450 12/05/17 1854 12/06/17 0431 12/07/17 0334 12/08/17 1258 12/09/17 0450 12/10/17 0401  NA 134*  --  136 139 138 134* 133*  K 3.6  --  4.2 4.5 4.7 4.6 5.7*  CL 101  --  103 106 103 100* 101  CO2 24  --  _1 GLUCOSE 163*  --  225* 169* 160* 154* 130*  BUN 26*  --  24* 25* 30* 29* 28*  CREATININE 1.43*  --  1.32* 1.36* 1.53* 1.51* 1.38*  CALCIUM 8.2*  --  8.4* 8.7* 9.1 8.9 8.5*  MG 1.8 1.8 2.0 1.9  --   --   --   PHOS  --  3.5 3.7 3.6  --   --   --    GFR: Estimated Creatinine Clearance: 38.6 mL/min (A) (by C-G formula based on SCr of 1.38 mg/dL (H)). Liver Function Tests: Recent Labs  Lab 12/09/17 0450  PROT 6.9   No results for input(s): LIPASE, AMYLASE in the last 168 hours. No results for input(s): AMMONIA in the last 168 hours. Coagulation Profile: Recent Labs  Lab 12/04/17 2046 12/05/17 0450 12/06/17 0431 12/07/17 0334 12/09/17 0450  INR 1.85 1.76 1.53 1.51 1.35   Cardiac Enzymes: No results for input(s): CKTOTAL, CKMB, CKMBINDEX,  TROPONINI in the last 168 hours. BNP (last 3 results) No results for input(s): PROBNP in the last 8760 hours. HbA1C: No results for input(s): HGBA1C in the last 72 hours. CBG: Recent Labs  Lab 12/09/17 0601 12/09/17 1137 12/09/17 1616 12/10/17 0102 12/10/17 0607  GLUCAP 150* 221* 241* 143* 113*   Lipid Profile: No results for input(s): CHOL, HDL, LDLCALC, TRIG, CHOLHDL, LDLDIRECT in the last 72 hours. Thyroid Function Tests: No results for input(s): TSH, T4TOTAL, FREET4, T3FREE, THYROIDAB in the last 72 hours. Anemia Panel: No results for input(s): VITAMINB12, FOLATE, FERRITIN, TIBC, IRON, RETICCTPCT in the last 72 hours.    Radiology  Studies: I have reviewed all of the imaging during this hospital visit personally     Scheduled Meds: . aspirin EC  81 mg Oral Daily  . chlorhexidine gluconate (MEDLINE KIT)  15 mL Mouth Rinse BID  . feeding supplement (ENSURE ENLIVE)  237 mL Oral BID BM  . furosemide  40 mg Oral Daily  . insulin aspart  0-15 Units Subcutaneous TID WC  . mouth rinse  15 mL Mouth Rinse BID  . pantoprazole  40 mg Oral Daily  . sodium chloride flush  10-40 mL Intracatheter Q12H   Continuous Infusions: . sodium chloride Stopped (12/07/17 0600)  . sodium chloride       LOS: 7 days        Tawni Millers, MD [facility] Pager [phone]

## 2017-12-11 DIAGNOSIS — I63512 Cerebral infarction due to unspecified occlusion or stenosis of left middle cerebral artery: Secondary | ICD-10-CM

## 2017-12-11 LAB — BASIC METABOLIC PANEL
Anion gap: 10 (ref 5–15)
BUN: 25 mg/dL — ABNORMAL HIGH (ref 6–20)
CALCIUM: 8.7 mg/dL — AB (ref 8.9–10.3)
CHLORIDE: 103 mmol/L (ref 101–111)
CO2: 21 mmol/L — ABNORMAL LOW (ref 22–32)
CREATININE: 1.31 mg/dL — AB (ref 0.44–1.00)
GFR calc non Af Amer: 39 mL/min — ABNORMAL LOW (ref >60)
GFR, EST AFRICAN AMERICAN: 45 mL/min — AB (ref >60)
Glucose, Bld: 125 mg/dL — ABNORMAL HIGH (ref 65–99)
Potassium: 4.2 mmol/L (ref 3.5–5.1)
SODIUM: 134 mmol/L — AB (ref 135–145)

## 2017-12-11 LAB — GLUCOSE, CAPILLARY
Glucose-Capillary: 127 mg/dL — ABNORMAL HIGH (ref 65–99)
Glucose-Capillary: 153 mg/dL — ABNORMAL HIGH (ref 65–99)
Glucose-Capillary: 156 mg/dL — ABNORMAL HIGH (ref 65–99)
Glucose-Capillary: 259 mg/dL — ABNORMAL HIGH (ref 65–99)

## 2017-12-11 NOTE — Progress Notes (Signed)
PROGRESS NOTE    Ana Gardner  EUM:[id number] DOB: [birth year] DOA: 12/03/2017 PCP: Karleen Hampshire., MD    Brief Narrative:  77 year old female who presented with right-sided weakness. She does have a significant past medical history of sarcoidosis, right-sided heart failure, Mobitz 2 AV block, chronic atrial fibrillation, type 2 diabetes mellitus, dyslipidemia, chronic kidney disease stage III, hypertension and nonalcoholic liver cirrhosis. Patient developed acute aphasia, with right-sided hemiplegia and left gaze preference,while being and [facility] emergency department.She initially presented to the emergency room due to worsening dyspnea and edema,apparently she had a recent hospitalization for decompensated heart and liver failure,that required thoracentesis and aggressive diuresis.  Patient was unable to protect her airway,and she was placed on invasive mechanical ventilation. CT angiography showed proximal posterior division left M2/M3 and left vertebral artery occlusion. Patient underwent revascularization of the left MCA.  On her initial physical examination blood pressure was 128/84, heart rate 88, temperature 98, respiratory 22, oxygen saturation 99% on invasive mechanical ventilation. Moist mucous membranes, lungs clear to auscultation bilaterally, heart S1-S2 present and rhythmic, 3-6 systolic ejection murmur, abdomen was soft nontender, 1+ bilateral lower extremity edema.Sodium 122, potassium 4.2, chloride 94, bicarb 31, glucose 137, BUN 51, creatinine 2.0,BNP 381, white count 7.1, hemoglobin 10.1, hematocrit 28.6, platelets 432.Urinalysis negative for infection. Urine drug screen positive for benzodiazepines. Her chest x-ray showed a large right pleural effusion.EKG showed atrial pacing, V sensing, right bundle branch block. Positive delayed between a paced and ventricular response, rate 82 bmp.   Patient was admitted to the ICU due to acute ischemic CVA complicated  by acute hypoxemic respiratory failure.  Patient has an prolonged hospital course, self extubated and transferred to the medical ward.  Neurologically and respiratory improving.      Assessment & Plan:   Active Problems:   Stroke [facility])   Acute CVA (cerebrovascular accident) ([place])   Stroke (cerebrum) (HCC)   Recurrent right pleural effusion   Middle cerebral artery embolism, left   Paroxysmal atrial fibrillation ([facility]CC)   Anemia   Elevated INR   Alcoholic cirrhosis of liver with ascites ([place])   Acute hemorrhagic infarction of brain ([place])   1. Acute ischemic CVA/ left MCA infarct/ embolic, complicated with hemorrhagic conversion at the left insular. Patient is medically stable to be transfer to CIR, follow with social services. Antiplatelet therapy with asa due to high risk of bleeding. Plan for outpatient head CT to follow on bleeding.    2. Acute hypoxic respiratory failure due to right sided heart failure acute on chronic,severe pulmonary hypertension,complicated by right side transudate pleural effusion. (self extubation 5/7).Severe RV dilatation, and significant reduction in systolic function, interventricular septum flattening in systole and diastole, peak PA pressure is 71 mmHG.Oxygenating well, dyspnea has improved, will continue current regimen of diuresis, will need pulmonary rehab. Patient with severe pulmonary HTN and right heart failure, poor prognosis.   3. Atrial fibrillation chronic.Pacer rhythm,withcontrolled rate.Rate continue to be controlled, patient has pacer in place, will continue aspiring 81 mg for now, due to high risk of bleeding and current intracranial bleed.    4. Alcoholic liver cirrhosis' cardiac cirrhosis. Continue supportive medical care, no clinical signs of encephalopathy or significant ascites.   5. AKI with hyponatremia. Renal function with stable cr at 1.31, K has been corrected to 4,2 and serum bicarbonate at 21. Will continue current dose  of furosemide, follow on renal panel in am.  6. T2DM.Continue glucose cover and monitoring with insulin sliding scale, capillary glucose 121, 207,  188, 127, 153.    DVT prophylaxis:enoxaparin Code Status:full Family Communication:I spoke with patient's daughter over the phone at the bedside and all questions were addressed.   Disposition Plan:may need snf when stable.   Consultants:  Neurology  Procedures:    Antimicrobials:   Subjective: Patient is feeling better, dyspnea is close to baseline, no chest pain, no nausea or vomiting. Had loose stools last night.   Objective: Vitals:   12/11/17 0016 12/11/17 0434 12/11/17 0500 12/11/17 0902  BP:  108/66  102/62  Pulse: 80 79  80  Resp:  19  18  Temp:  97.6 F (36.4 C)  98 F (36.7 C)  TempSrc:  Oral  Oral  SpO2: 94% 94%  94%  Weight:   84.7 kg (186 lb 11.7 oz)   Height:        Intake/Output Summary (Last 24 hours) at 12/11/2017 1140 Last data filed at 12/11/2017 0900 Gross per 24 hour  Intake 720 ml  Output 500 ml  Net 220 ml   Filed Weights   12/09/17 0400 12/10/17 0409 12/11/17 0500  Weight: 84 kg (185 lb 3 oz) 84.5 kg (186 lb 4.6 oz) 84.7 kg (186 lb 11.7 oz)    Examination:   General: Not in pain or dyspnea, deconditioned Neurology: Awake and alert, non focal  E ENT: mild pallor, no icterus, oral mucosa moist Cardiovascular: No JVD. S1-S2 present, rhythmic, no gallops, rubs, or murmurs. + pitting lower extremity edema. Pulmonary: decreased breath sounds bilaterally at bases,  no wheezing, rhonchi or rales. Gastrointestinal. Abdomen with no organomegaly, non tender, no rebound or guarding Skin. No rashes Musculoskeletal: no joint deformities     Data Reviewed: I have personally reviewed following labs and imaging studies  CBC: Recent Labs  Lab 12/06/17 0431 12/07/17 0334 12/08/17 1258 12/09/17 0450 12/10/17 0636  WBC 16.1* 10.1 8.3 7.9 7.9  HGB 8.3* 8.0* 9.2* 8.4* 8.4*    HCT 26.1* 25.5* 28.5* 25.6* 25.3*  MCV 69.6* 69.7* 69.9* 69.4* 69.3*  PLT 313 306 315 352 235   Basic Metabolic Panel: Recent Labs  Lab 12/05/17 0450 12/05/17 1854 12/06/17 0431 12/07/17 0334 12/08/17 1258 12/09/17 0450 12/10/17 0401 12/11/17 0654  NA 134*  --  136 139 138 134* 133* 134*  K 3.6  --  4.2 4.5 4.7 4.6 5.7* 4.2  CL 101  --  103 106 103 100* 101 103  CO2 24  --  24 26 22 22 24  21*  GLUCOSE 163*  --  225* 169* 160* 154* 130* 125*  BUN 26*  --  24* 25* 30* 29* 28* 25*  CREATININE 1.43*  --  1.32* 1.36* 1.53* 1.51* 1.38* 1.31*  CALCIUM 8.2*  --  8.4* 8.7* 9.1 8.9 8.5* 8.7*  MG 1.8 1.8 2.0 1.9  --   --   --   --   PHOS  --  3.5 3.7 3.6  --   --   --   --    GFR: Estimated Creatinine Clearance: 40.7 mL/min (A) (by C-G formula based on SCr of 1.31 mg/dL (H)). Liver Function Tests: Recent Labs  Lab 12/09/17 0450  PROT 6.9   No results for input(s): LIPASE, AMYLASE in the last 168 hours. No results for input(s): AMMONIA in the last 168 hours. Coagulation Profile: Recent Labs  Lab 12/04/17 2046 12/05/17 0450 12/06/17 0431 12/07/17 0334 12/09/17 0450  INR 1.85 1.76 1.53 1.51 1.35   Cardiac Enzymes: No results for input(s): CKTOTAL, CKMB, CKMBINDEX, TROPONINI  in the last 168 hours. BNP (last 3 results) No results for input(s): PROBNP in the last 8760 hours. HbA1C: No results for input(s): HGBA1C in the last 72 hours. CBG: Recent Labs  Lab 12/10/17 1115 12/10/17 1640 12/10/17 2146 12/11/17 0558 12/11/17 1129  GLUCAP 121* 207* 188* 127* 153*   Lipid Profile: No results for input(s): CHOL, HDL, LDLCALC, TRIG, CHOLHDL, LDLDIRECT in the last 72 hours. Thyroid Function Tests: No results for input(s): TSH, T4TOTAL, FREET4, T3FREE, THYROIDAB in the last 72 hours. Anemia Panel: No results for input(s): VITAMINB12, FOLATE, FERRITIN, TIBC, IRON, RETICCTPCT in the last 72 hours.    Radiology Studies: I have reviewed all of the imaging during this  hospital visit personally     Scheduled Meds: . aspirin EC  81 mg Oral Daily  . feeding supplement (ENSURE ENLIVE)  237 mL Oral BID BM  . furosemide  40 mg Oral Daily  . insulin aspart  0-15 Units Subcutaneous TID WC  . mouth rinse  15 mL Mouth Rinse BID  . pantoprazole  40 mg Oral Daily   Continuous Infusions: . sodium chloride Stopped (12/07/17 0600)     LOS: 8 days        Obie Silos Gerome Apley, MD [facility] Pager [phone]

## 2017-12-11 NOTE — Progress Notes (Signed)
Pt resting in bed. Denies needs. NAD.

## 2017-12-12 LAB — GLUCOSE, CAPILLARY
GLUCOSE-CAPILLARY: 145 mg/dL — AB (ref 65–99)
GLUCOSE-CAPILLARY: 152 mg/dL — AB (ref 65–99)
Glucose-Capillary: 157 mg/dL — ABNORMAL HIGH (ref 65–99)
Glucose-Capillary: 230 mg/dL — ABNORMAL HIGH (ref 65–99)

## 2017-12-12 LAB — BASIC METABOLIC PANEL
ANION GAP: 9 (ref 5–15)
BUN: 23 mg/dL — ABNORMAL HIGH (ref 6–20)
CALCIUM: 8.6 mg/dL — AB (ref 8.9–10.3)
CO2: 23 mmol/L (ref 22–32)
Chloride: 100 mmol/L — ABNORMAL LOW (ref 101–111)
Creatinine, Ser: 1.35 mg/dL — ABNORMAL HIGH (ref 0.44–1.00)
GFR, EST AFRICAN AMERICAN: 43 mL/min — AB (ref >60)
GFR, EST NON AFRICAN AMERICAN: 37 mL/min — AB (ref >60)
Glucose, Bld: 171 mg/dL — ABNORMAL HIGH (ref 65–99)
Potassium: 4.8 mmol/L (ref 3.5–5.1)
SODIUM: 132 mmol/L — AB (ref 135–145)

## 2017-12-12 LAB — PATHOLOGIST SMEAR REVIEW

## 2017-12-12 NOTE — Progress Notes (Signed)
Occupational Therapy Treatment Patient Details Name: Ana Gardner MRN: [id number] DOB: [birth year] Today's Date: 12/12/2017    History of present illness 77 y.o. 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 No tPA given due to recent GI bleeding.  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   OT comments  Pt making good progress with adls and R inattn appears to be improving during adls in room.  Pt continues to get very SOB and requires rest breaks to sit when doing adls in standing.  Feel pt is a good rehab candidate due to cognitive deficits and fall risk, but feel she still may need someone for a short while 24/7 at home with her.  Pt has improved but cognitive deficits and need for frequent rest breaks makes her a fall risk if at home alone.  Will continue to follow.  Follow Up Recommendations  CIR    Equipment Recommendations  Other (comment)(tbd)    Recommendations for Other Services Other (comment)(may need SNF if noone available to assist after rehab)    Precautions / Restrictions Precautions Precautions: Fall Precaution Comments: monitor O2 sats Restrictions Weight Bearing Restrictions: No       Mobility Bed Mobility Overal bed mobility: Needs Assistance Bed Mobility: Supine to Sit Rolling: Supervision         General bed mobility comments: Pt moving well in bed.  Transfers Overall transfer level: Needs assistance Equipment used: Rolling walker (2 wheeled) Transfers: Sit to/from Stand Sit to Stand: Min assist Stand pivot transfers: Min assist       General transfer comment: Pt requires cues for hand placement each time and  cues to no flop back when sitting down.    Balance Overall balance assessment: Needs assistance Sitting-balance support: Feet supported Sitting balance-Leahy Scale: Good     Standing balance support: During functional activity;Bilateral upper extremity supported Standing balance-Leahy Scale: Fair Standing balance comment: Pt did not need physical assist to stand at sink to groom but pt's standing endurance is very low and she requests to sit frequently.  Would not walk out of room without a chair to follow or rollator.  Feel pt may benefit from rollator at d/c.                           ADL either performed or assessed with clinical judgement   ADL Overall ADL's : Needs assistance/impaired Eating/Feeding: Supervision/ safety;Sitting   Grooming: Wash/dry hands;Wash/dry face;Min guard;Standing Grooming Details (indicate cue type and reason): Pt stood for appx 2 min at sink while grooming but becomes very fatigues very quickly on 2 L O2 asking to sit every 2-3 minutes.   Upper Body Bathing: Minimal assistance;Sitting   Lower Body Bathing: Minimal assistance;Sit to/from stand;Cueing for sequencing;Cueing for compensatory techniques Lower Body Bathing Details (indicate cue type and reason): Pt required cues for what to do next but did not need physical assist until she stood with walker to wash bottom requiring min assist. Upper Body Dressing : Minimal assistance;Sitting   Lower Body Dressing: Minimal assistance;Sit to/from stand;Cueing for compensatory techniques;Cueing for safety Lower Body Dressing Details (indicate cue type and reason): Pt donned socks and shoes without assist. pt required assist  standing to pull up pants and maintain balance. Toilet Transfer: Minimal assistance;Ambulation;Comfort height toilet;Grab bars;RW Toilet Transfer Details (indicate cue type and reason): pt walked to bathroom but did need significant rest on toilet before returning to bed due to SOB and  fatigue. Toileting- Clothing Manipulation and Hygiene: Minimal assistance;Sit to/from stand;Cueing for compensatory techniques;Cueing for safety       Functional mobility during ADLs: Minimal assistance;Rolling walker General ADL Comments: pt completing room and hallway functional mobility, requires multiple rest breaks during session due to fatigue; educated pt in deep breathing techniques with pt having difficulty fully understanding and completing, difficulty obtaining clear SpO2 reading using both portable pulse ox and dinamap, though when able to obtain clear reading O2 sats remaining in high 80's-low 90's on RA, 2L O2 applied end of session      Vision   Vision Assessment?: Vision impaired- to be further tested in functional context Additional Comments: pt did tend to r side of environment when washing hands and grooming at sink.  Need to get pt in hallway in larger environment to look more closely at vision.   Perception     Praxis      Cognition Arousal/Alertness: Awake/alert Behavior During Therapy: WFL for tasks assessed/performed Overall Cognitive Status: Impaired/Different from baseline Area of Impairment: Orientation;Attention;Memory;Safety/judgement;Awareness;Problem solving                 Orientation Level: Situation Current Attention Level: Focused Memory: Decreased short-term memory Following Commands: Follows one step commands consistently Safety/Judgement: Decreased awareness of safety;Decreased awareness of deficits Awareness: Intellectual Problem Solving: Slow processing;Decreased initiation;Difficulty sequencing;Requires verbal cues General Comments: Pt perseverating on horseness of voice this am.  Asked each person that entered room about it.        Exercises     Shoulder Instructions       General Comments Pt is a safey risk for being alone. Feel she is a fall risk.  Family not available to supervision pt after rehab but pt may not be mod I  after rehab stay due to cognitive and breathing deficits.    Pertinent Vitals/ Pain       Pain Assessment: No/denies pain  Home Living                                          Prior Functioning/Environment              Frequency  Min 2X/week        Progress Toward Goals  OT Goals(current goals can now be found in the care plan section)  Progress towards OT goals: Progressing toward goals  Acute Rehab OT Goals Patient Stated Goal: to be independent via daughter Time For Goal Achievement: 12/19/17 Potential to Achieve Goals: Good ADL Goals Pt Will Perform Grooming: with mod assist;sitting Pt Will Perform Upper Body Bathing: with mod assist;sitting Pt Will Perform Lower Body Bathing: with mod assist;sit to/from stand Pt Will Transfer to Toilet: with min assist;ambulating;regular height toilet;bedside commode;grab bars Pt Will Perform Toileting - Clothing Manipulation and hygiene: with mod assist;sit to/from stand Additional ADL Goal #1: Pt will sustain attention to familiar ADL tasks x 4 mins with min cues Additional ADL Goal #2: Pt will locate items on her Rt with min cues during ADLs  Plan Discharge plan remains appropriate    Co-evaluation  AM-PAC PT "6 Clicks" Daily Activity     Outcome Measure   Help from another person eating meals?: A Little Help from another person taking care of personal grooming?: A Little Help from another person toileting, which includes using toliet, bedpan, or urinal?: A Little Help from another person bathing (including washing, rinsing, drying)?: A Little Help from another person to put on and taking off regular upper body clothing?: A Little Help from another person to put on and taking off regular lower body clothing?: A Little 6 Click Score: 18    End of Session Equipment Utilized During Treatment: Oxygen  OT Visit Diagnosis: Unsteadiness on feet (R26.81);Cognitive communication deficit  (R41.841);Hemiplegia and hemiparesis Symptoms and signs involving cognitive functions: Cerebral infarction Hemiplegia - Right/Left: Right Hemiplegia - dominant/non-dominant: Dominant Hemiplegia - caused by: Cerebral infarction   Activity Tolerance Patient tolerated treatment well   Patient Left in chair;with call bell/phone within reach;with chair alarm set   Nurse Communication Mobility status        Time: 4383-8184 OT Time Calculation (min): 24 min  Charges: OT General Charges $OT Visit: 1 Visit OT Treatments $Self Care/Home Management : 23-37 mins  Jinger Neighbors, OTR/L [phone]   Glenford Peers 12/12/2017, 9:52 AM

## 2017-12-12 NOTE — Progress Notes (Signed)
PROGRESS NOTE    Ana Gardner  IEP:[id number] DOB: Dec 04, 1940 DOA: 12/03/2017 PCP: Karleen Hampshire., MD    Brief Narrative:  77 year old female who presented with right-sided weakness. She does have a significant past medical history of sarcoidosis, right-sided heart failure, Mobitz 2 AV block, chronic atrial fibrillation, type 2 diabetes mellitus, dyslipidemia, chronic kidney disease stage III, hypertension and nonalcoholic liver cirrhosis. Patient developed acute aphasia, with right-sided hemiplegia and left gaze preference,while being and [facility] emergency department.She initially presented to the emergency room due to worsening dyspnea and edema,apparently she had a recent hospitalization for decompensated heart and liver failure,that required thoracentesis and aggressive diuresis.  Patient was unable to protect her airway,and she was placed on invasive mechanical ventilation. CT angiography showed proximal posterior division left M2/M3 and left vertebral artery occlusion. Patient underwent revascularization of the left MCA.  On her initial physical examination blood pressure was 128/84, heart rate 88, temperature 98, respiratory 22, oxygen saturation 99% on invasive mechanical ventilation. Moist mucous membranes, lungs clear to auscultation bilaterally, heart S1-S2 present and rhythmic, 3-6 systolic ejection murmur, abdomen was soft nontender, 1+ bilateral lower extremity edema.Sodium 122, potassium 4.2, chloride 94, bicarb 31, glucose 137, BUN 51, creatinine 2.0,BNP 381, white count 7.1, hemoglobin 10.1, hematocrit 28.6, platelets 432.Urinalysis negative for infection. Urine drug screen positive for benzodiazepines. Her chest x-ray showed a large right pleural effusion.EKG showed atrial pacing, V sensing, right bundle branch block. Positive delayed between a paced and ventricular response, rate 82 bmp.   Patient was admitted to the ICU due to acute ischemic CVA complicated  by acute hypoxemic respiratory failure.  Patient has an prolonged hospital course, self extubated and transferred to the medical ward. Neurologically and respiratory improving.    Assessment & Plan:   Active Problems:   Stroke [facility])   Acute CVA (cerebrovascular accident) ([place])   Stroke (cerebrum) (HCC)   Recurrent right pleural effusion   Middle cerebral artery embolism, left   Paroxysmal atrial fibrillation (HCC)   Anemia   Elevated INR   Alcoholic cirrhosis of liver with ascites ([place])   Acute hemorrhagic infarction of brain ([place])   1. Acute ischemic CVA/ left MCA infarct/ embolic, complicated with hemorrhagic conversion at the left insular. Waiting inpatient rehab evaluation. Continue with antiplatelet therapy with asa will need outpatient head CT to follow on bleeding.   2. Acute hypoxic respiratory failure due to right sided heart failure acute on chronic,severe pulmonary hypertension,complicated by right side transudate pleural effusion. (self extubation 5/7).Severe RV dilatation, and significant reduction in systolic function, interventricular septum flattening in systole and diastole, peak PA pressure is 71 mmHG.Continue with furosemide 40 mg for diuresis, severe pulmonary HTN and right heart failure, poor prognosis. Will need further pulmonary rehab.   3. Atrial fibrillation chronic.Pacer rhythm,withcontrolled rate.Continue to be rate controlled,pacer in place, anticoagulation with [id number] mg.   4. Alcoholic liver cirrhosis' cardiac cirrhosis. No clinical encephalopathy or significant ascites. Follow as outpatient.   5. AKI with hyponatremia.Serum cr stable at 1,3, K at 4,8 and serum bicarbonate at 23. Will continue current dose of furosemide and will follow on renal panel in am. Patient is pre-load dependent due to pulmonary hypertension.   6. T2DM. Glucose cover and monitoring with insulin sliding scale, capillary glucose153, 156, 259, 145, 157.   DVT  prophylaxis:enoxaparin Code Status:full Family Communication:I spoke with patient's daughter over the phone at the bedside and all questions were addressed.  Disposition Plan:may need snf when stable.   Consultants:  Neurology  Procedures:    Antimicrobials:  Subjective: Patient is feeling well, working with physical therapy. Dyspnea has improved, no chest pain, no nausea or vomiting.   Objective: Vitals:   12/12/17 0017 12/12/17 0409 12/12/17 0500 12/12/17 0728  BP: 114/75 (!) 119/45  100/65  Pulse: 83 79  82  Resp: 18 19  17   Temp: 98 F (36.7 C) 98 F (36.7 C)  98.2 F (36.8 C)  TempSrc: Oral Oral  Oral  SpO2: 96% 99%  91%  Weight:   83.3 kg (183 lb 10.3 oz)   Height:        Intake/Output Summary (Last 24 hours) at 12/12/2017 0936 Last data filed at 12/12/2017 0500 Gross per 24 hour  Intake -  Output 825 ml  Net -825 ml   Filed Weights   12/10/17 0409 12/11/17 0500 12/12/17 0500  Weight: 84.5 kg (186 lb 4.6 oz) 84.7 kg (186 lb 11.7 oz) 83.3 kg (183 lb 10.3 oz)    Examination:   General: Not in pain or dyspnea, deconditioned Neurology: Awake and alert, non focal  E ENT: mild pallor, no icterus, oral mucosa moist Cardiovascular: No JVD. S1-S2 present, rhythmic, no gallops, rubs, or murmurs. Trace lower extremity edema. Pulmonary: decreased breath sounds at the right base, adequate air movement, no wheezing, rhonchi or rales. Gastrointestinal. Abdomen with no organomegaly, non tender, no rebound or guarding Skin. No rashes Musculoskeletal: no joint deformities     Data Reviewed: I have personally reviewed following labs and imaging studies  CBC: Recent Labs  Lab 12/06/17 0431 12/07/17 0334 12/08/17 1258 12/09/17 0450 12/10/17 0636  WBC 16.1* 10.1 8.3 7.9 7.9  HGB 8.3* 8.0* 9.2* 8.4* 8.4*  HCT 26.1* 25.5* 28.5* 25.6* 25.3*  MCV 69.6* 69.7* 69.9* 69.4* 69.3*  PLT 313 306 315 352 527   Basic Metabolic Panel: Recent Labs    Lab 12/05/17 1854  12/06/17 0431 12/07/17 0334 12/08/17 1258 12/09/17 0450 12/10/17 0401 12/11/17 0654  NA  --    < > 136 139 138 134* 133* 134*  K  --    < > 4.2 4.5 4.7 4.6 5.7* 4.2  CL  --    < > 103 106 103 100* 101 103  CO2  --    < > 24 26 22 22 24  21*  GLUCOSE  --    < > 225* 169* 160* 154* 130* 125*  BUN  --    < > 24* 25* 30* 29* 28* 25*  CREATININE  --    < > 1.32* 1.36* 1.53* 1.51* 1.38* 1.31*  CALCIUM  --    < > 8.4* 8.7* 9.1 8.9 8.5* 8.7*  MG 1.8  --  2.0 1.9  --   --   --   --   PHOS 3.5  --  3.7 3.6  --   --   --   --    < > = values in this interval not displayed.   GFR: Estimated Creatinine Clearance: 40.4 mL/min (A) (by C-G formula based on SCr of 1.31 mg/dL (H)). Liver Function Tests: Recent Labs  Lab 12/09/17 0450  PROT 6.9   No results for input(s): LIPASE, AMYLASE in the last 168 hours. No results for input(s): AMMONIA in the last 168 hours. Coagulation Profile: Recent Labs  Lab 12/06/17 0431 12/07/17 0334 12/09/17 0450  INR 1.53 1.51 1.35   Cardiac Enzymes: No results for input(s): CKTOTAL, CKMB, CKMBINDEX, TROPONINI in the last 168 hours. BNP (last 3 results) No  results for input(s): PROBNP in the last 8760 hours. HbA1C: No results for input(s): HGBA1C in the last 72 hours. CBG: Recent Labs  Lab 12/11/17 0558 12/11/17 1129 12/11/17 1634 12/11/17 2147 12/12/17 0615  GLUCAP 127* 153* 156* 259* 145*   Lipid Profile: No results for input(s): CHOL, HDL, LDLCALC, TRIG, CHOLHDL, LDLDIRECT in the last 72 hours. Thyroid Function Tests: No results for input(s): TSH, T4TOTAL, FREET4, T3FREE, THYROIDAB in the last 72 hours. Anemia Panel: No results for input(s): VITAMINB12, FOLATE, FERRITIN, TIBC, IRON, RETICCTPCT in the last 72 hours.    Radiology Studies: I have reviewed all of the imaging during this hospital visit personally     Scheduled Meds: . aspirin EC  81 mg Oral Daily  . feeding supplement (ENSURE ENLIVE)  237 mL Oral BID  BM  . furosemide  40 mg Oral Daily  . insulin aspart  0-15 Units Subcutaneous TID WC  . mouth rinse  15 mL Mouth Rinse BID  . pantoprazole  40 mg Oral Daily   Continuous Infusions: . sodium chloride Stopped (12/07/17 0600)     LOS: 9 days        Mauricio Gerome Apley, MD [facility] Pager [phone]

## 2017-12-12 NOTE — Progress Notes (Signed)
  Speech Language Pathology Treatment: Cognitive-Linquistic  Patient Details Name: MERCADES BAJAJ MRN: [id number] DOB: May 17, 1941 Today's Date: 12/12/2017 Time: 4481-8563 SLP Time Calculation (min) (ACUTE ONLY): 15 min  Assessment / Plan / Recommendation Clinical Impression  Skilled treatment session focused on cognition goals. SLP facilitated session by providing Min A cues to demonstrate selective attention to basic problem solving task, and to increase verbal attention to picture description task. Pt required Mod A cues to scan to right while looking at the picture.    HPI HPI: Ms. NEETA STOREY is a 77 y.o. 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 No tPA given due to recent GI bleeding.  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 No MRI due to pacer. Intubated from 5/4-5/7 when she self extubated.       SLP Plan  Continue with current plan of care       Recommendations                   Follow up Recommendations: Skilled Nursing facility SLP Visit Diagnosis: Cognitive communication deficit (J49.702) Plan: Continue with current plan of care       [place] 12/12/2017, 2:23 PM

## 2017-12-12 NOTE — Progress Notes (Signed)
Physical Therapy Treatment Patient Details Name: Ana Gardner MRN: [id number] DOB: 03/07/1941 Today's Date: 12/12/2017    History of Present Illness 77 y.o. 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 No tPA given due to recent GI bleeding.  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    PT Comments    Patient is making gradual progress toward mobility goals. Pt required assistance for safe OOB mobility presents with decreased activity tolerance and balance/cognitive impairments increasing risk for falls. Continue to recommend post acute rehab for further skilled PT services to maximize independence and safety with mobility.    Follow Up Recommendations  CIR     Equipment Recommendations  Other (comment)(TBD)    Recommendations for Other Services       Precautions / Restrictions Precautions Precautions: Fall Precaution Comments: monitor O2 sats Restrictions Weight Bearing Restrictions: No    Mobility  Bed Mobility Overal bed mobility: Needs Assistance Bed Mobility: Supine to Sit Rolling: Supervision         General bed mobility comments: supervision for safety; use of bed rail   Transfers Overall transfer level: Needs assistance Equipment used: Rolling walker (2 wheeled) Transfers: Sit to/from Stand Sit to Stand: Min assist         General transfer comment: assist to steady; cues for safe hand placement  Ambulation/Gait Ambulation/Gait assistance: Min assist Ambulation Distance (Feet): (78ft, 9ft X2, 46ft then 53ft) Assistive device: Rolling walker (2 wheeled) Gait Pattern/deviations: Step-through pattern;Decreased step  length - right;Decreased step length - left;Decreased dorsiflexion - right;Decreased dorsiflexion - left;Wide base of support;Trunk flexed Gait velocity: decreased   General Gait Details: cues for posture, increased bilat step lengths, and safe use of AD especially when turning and with directional changes; pt with SpO2 of 90% on RA with mobility; pt required multiple standing rest breaks with pt leaning on RW despite cues not to lean on RW and one seated rest break   Stairs             Wheelchair Mobility    Modified Rankin (Stroke Patients Only)       Balance Overall balance assessment: Needs assistance Sitting-balance support: Feet supported Sitting balance-Leahy Scale: Good     Standing balance support: During functional activity;Bilateral upper extremity supported Standing balance-Leahy Scale: Fair                              Cognition Arousal/Alertness: Awake/alert Behavior During Therapy: WFL for tasks assessed/performed Overall Cognitive Status: Impaired/Different from baseline Area of Impairment: Orientation;Attention;Memory;Safety/judgement;Awareness;Problem solving                 Orientation Level: Situation Current Attention Level: Focused Memory: Decreased short-term memory Following Commands: Follows one step commands consistently Safety/Judgement: Decreased awareness of safety;Decreased awareness of deficits Awareness: Intellectual Problem Solving: Slow processing;Decreased initiation;Difficulty sequencing;Requires verbal cues General Comments: Pt perseverating on horseness of voice and that she "has these heart and lung potentials"      Exercises      General Comments        Pertinent Vitals/Pain Pain Assessment: No/denies pain    Home Living  Prior Function            PT Goals (current goals can now be found in the care plan section) Acute Rehab PT Goals Patient Stated Goal: to be  independent via daughter Progress towards PT goals: Progressing toward goals    Frequency    Min 4X/week      PT Plan Current plan remains appropriate    Co-evaluation              AM-PAC PT "6 Clicks" Daily Activity  Outcome Measure  Difficulty turning over in bed (including adjusting bedclothes, sheets and blankets)?: Unable Difficulty moving from lying on back to sitting on the side of the bed? : A Lot Difficulty sitting down on and standing up from a chair with arms (e.g., wheelchair, bedside commode, etc,.)?: Unable Help needed moving to and from a bed to chair (including a wheelchair)?: A Little Help needed walking in hospital room?: A Little Help needed climbing 3-5 steps with a railing? : Total 6 Click Score: 11    End of Session Equipment Utilized During Treatment: Gait belt(O2 donned end of session) Activity Tolerance: Patient limited by fatigue Patient left: in chair;with call bell/phone within reach;with chair alarm set Nurse Communication: Mobility status PT Visit Diagnosis: Other symptoms and signs involving the nervous system (R29.898)     Time: 1445-1510 PT Time Calculation (min) (ACUTE ONLY): 25 min  Charges:  $Gait Training: 23-37 mins                    G Codes:       Earney Navy, PTA Pager: [phone]     Darliss Cheney 12/12/2017, 3:46 PM

## 2017-12-12 NOTE — Progress Notes (Signed)
CSW heard back from patient's daughter that she would like [facility] for rehab. CSW checked in with Admissions at facility to check on available beds and request that facility initiate insurance authorization. Patient will need an updated PT note; CSW paged PT to request update.   CSW to follow.  Laveda Abbe, [place] Clinical Social Worker [phone]

## 2017-12-12 NOTE — Clinical Social Work Note (Signed)
Clinical Social Work Assessment  Patient Details  Name: Ana Gardner MRN: [id number] Date of Birth: 1941-06-26  Date of referral:  12/12/17               Reason for consult:  Facility Placement                Permission sought to share information with:  Facility Sport and exercise psychologist, Family Supports Permission granted to share information::  Yes, Verbal Permission Granted  Name::     Physicist, medical::  SNF  Relationship::  Daughter  Contact Information:     Housing/Transportation Living arrangements for the past 2 months:  Single Family Home Source of Information:  Patient, Adult Children Patient Interpreter Needed:  None Criminal Activity/Legal Involvement Pertinent to Current Situation/Hospitalization:  No - Comment as needed Significant Relationships:  Adult Children, Other Family Members Lives with:  Self Do you feel safe going back to the place where you live?  Yes Need for family participation in patient care:  Yes (Comment)  Care giving concerns:  Patient from home alone and will need short term rehab at discharge prior to returning home or long term care, depending on progress.   Social Worker assessment / plan:  CSW spoke with patient to discuss recommendation for SNF. CSW received permission from patient to call daughter and discuss options, as patient was having difficulty forming her words. CSW provided stroke education to the patient about why she was having difficulty with her word forming. CSW called patient's daughter via phone to discuss recommendation for SNF. CSW emailed patient's daughter a list of facility options. CSW to follow.  Employment status:  Retired Nurse, adult PT Recommendations:  Inpatient [place] / Referral to community resources:  [employer]  Patient/Family's Response to care:  Patient and daughter agreeable to SNF placement.  Patient/Family's Understanding of and Emotional  Response to Diagnosis, Current Treatment, and Prognosis:  Patient discussed how she was frustrated with her difficulty in forming words, and how she didn't understand why that was happening. Patient acknowledged understanding that she had a stroke and hopeful that with therapy that her ability to form words will come back to her. Patient's daughter agreed to look into SNF options, and questioned why the patient wasn't able to inpatient rehab.  Emotional Assessment Appearance:  Appears stated age Attitude/Demeanor/Rapport:  Engaged Affect (typically observed):  Pleasant Orientation:  Oriented to Self, Oriented to Situation, Oriented to  Time, Oriented to Place Alcohol / Substance use:  Not Applicable Psych involvement (Current and /or in the community):  No (Comment)  Discharge Needs  Concerns to be addressed:  Care Coordination Readmission within the last 30 days:  No Current discharge risk:  Dependent with Mobility Barriers to Discharge:  Continued Medical Work up, [place], [place] 12/12/2017, 9:54 PM

## 2017-12-12 NOTE — Progress Notes (Signed)
As per Admissions Coordinator follow up on 5/9, SNF is needed for pt does not have 24/7 caregiver support that will be needed at d/c. I discussed with Vida Roller, RN CM. We will sign off. [phone]

## 2017-12-13 DIAGNOSIS — I50811 Acute right heart failure: Secondary | ICD-10-CM

## 2017-12-13 LAB — BODY FLUID CULTURE: Culture: NO GROWTH

## 2017-12-13 LAB — BASIC METABOLIC PANEL
ANION GAP: 11 (ref 5–15)
BUN: 23 mg/dL — ABNORMAL HIGH (ref 6–20)
CALCIUM: 8.5 mg/dL — AB (ref 8.9–10.3)
CO2: 25 mmol/L (ref 22–32)
Chloride: 100 mmol/L — ABNORMAL LOW (ref 101–111)
Creatinine, Ser: 1.35 mg/dL — ABNORMAL HIGH (ref 0.44–1.00)
GFR, EST AFRICAN AMERICAN: 43 mL/min — AB (ref >60)
GFR, EST NON AFRICAN AMERICAN: 37 mL/min — AB (ref >60)
GLUCOSE: 146 mg/dL — AB (ref 65–99)
Potassium: 4.1 mmol/L (ref 3.5–5.1)
Sodium: 136 mmol/L (ref 135–145)

## 2017-12-13 LAB — GLUCOSE, CAPILLARY
GLUCOSE-CAPILLARY: 188 mg/dL — AB (ref 65–99)
Glucose-Capillary: 156 mg/dL — ABNORMAL HIGH (ref 65–99)
Glucose-Capillary: 181 mg/dL — ABNORMAL HIGH (ref 65–99)
Glucose-Capillary: 188 mg/dL — ABNORMAL HIGH (ref 65–99)

## 2017-12-13 MED ORDER — METOPROLOL SUCCINATE ER 25 MG PO TB24
25.0000 mg | ORAL_TABLET | Freq: Every day | ORAL | Status: DC
  Administered 2017-12-13 – 2017-12-15 (×3): 25 mg via ORAL
  Filled 2017-12-13 (×3): qty 1

## 2017-12-13 MED ORDER — TORSEMIDE 20 MG PO TABS
20.0000 mg | ORAL_TABLET | Freq: Every day | ORAL | Status: DC
  Administered 2017-12-14 – 2017-12-15 (×2): 20 mg via ORAL
  Filled 2017-12-13 (×2): qty 1

## 2017-12-13 MED ORDER — BOOST / RESOURCE BREEZE PO LIQD CUSTOM
1.0000 | Freq: Two times a day (BID) | ORAL | Status: DC
  Administered 2017-12-14 (×2): 1 via ORAL

## 2017-12-13 MED ORDER — AMIODARONE HCL 200 MG PO TABS
200.0000 mg | ORAL_TABLET | Freq: Every day | ORAL | Status: DC
  Administered 2017-12-13 – 2017-12-15 (×3): 200 mg via ORAL
  Filled 2017-12-13 (×3): qty 1

## 2017-12-13 MED ORDER — METOLAZONE 2.5 MG PO TABS
2.5000 mg | ORAL_TABLET | Freq: Once | ORAL | Status: AC
  Administered 2017-12-13: 2.5 mg via ORAL
  Filled 2017-12-13: qty 1

## 2017-12-13 NOTE — Progress Notes (Signed)
Nutrition Follow-up  DOCUMENTATION CODES:   Not applicable  INTERVENTION:  Boost Breeze po BID, each supplement provides 250 kcal and 9 grams of protein  NUTRITION DIAGNOSIS:   Inadequate oral intake related to lethargy/confusion as evidenced by meal completion < 25%. 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   Spoke with Ms. Lovena Le at bedside. She is unhappy with food provided and complains of stomach pain with ensure. RD offered many options to patient and she did not want any of them. Willing to try boost breeze, may be easier on her stomach. States she did not eat lunch or breakfast today. Exhibits weight fluctuations but she is back at baseline of 178 pounds. Will monitor.  Labs reviewed:  CBGs 188, 156 Medications reviewed and include:  Insulin  Diet Order:   Diet Order           Diet Carb Modified Fluid consistency: Thin; Room service appropriate? Yes  Diet effective now          EDUCATION NEEDS:   No education needs have been identified at this time  Skin:  Skin Assessment: Reviewed RN Assessment  Last BM:  12/11/2017  Height:   Ht Readings from Last 1 Encounters:  12/03/17 5\' 6"  (1.676 m)    Weight:   Wt Readings from Last 1 Encounters:  12/13/17 178 lb 5.6 oz (80.9 kg)    Ideal Body Weight:  59.09 kg  BMI:  Body mass index is 28.79 kg/m.  Estimated Nutritional Needs:   Kcal:  1650-1850 kcal  Protein:  100-120 grams  Fluid:  >2 L/day  Satira Anis. Amador Braddy, MS, RD LDN Inpatient Clinical Dietitian Pager  [phone]

## 2017-12-13 NOTE — Progress Notes (Signed)
Clinical Social Worker following patient for support and discharge needs. CSW reached out to admission coordinator Ardelle Park at [facility] to see if patient authorization though insurance has been attained. Ardelle Park stated at this time she has not been able to attain auth but stated will reach put to CSW once she has. CSW will continue to follow patient for needs.  Rhea Pink, MSW,  [place]

## 2017-12-13 NOTE — Progress Notes (Signed)
PROGRESS NOTE    Ana Gardner  HDQ:[id number] DOB: 03/07/1941 DOA: 12/03/2017 PCP: Karleen Hampshire., MD    Brief Narrative:  77 year old female who presented with right-sided weakness. She does have a significant past medical history of sarcoidosis, right-sided heart failure, Mobitz 2 AV block, sp pacer, chronic atrial fibrillation, type 2 diabetes mellitus, dyslipidemia, chronic kidney disease stage III, hypertension and nonalcoholic liver cirrhosis. Patient developed acute aphasia, with right-sided hemiplegia and left gaze preference,while being and [facility] emergency department.She initially presented to the emergency room due to worsening dyspnea and edema,apparently she had a recent hospitalization for decompensated heart and liver failure,that required thoracentesis and aggressive diuresis.  Patient was unable to protect her airway,and she was placed on invasive mechanical ventilation. CT angiography showed proximal posterior division left M2/M3 and left vertebral artery occlusion. Patient underwent urgent revascularization of the left MCA.  On her initial physical examination blood pressure was 128/84, heart rate 88, temperature 98, respiratory 22, oxygen saturation 99% on invasive mechanical ventilation. Moist mucous membranes, lungs clear to auscultation bilaterally, heart S1-S2 present and rhythmic, 3-6 systolic ejection murmur, abdomen was soft nontender, 1+ bilateral lower extremity edema.Sodium 122, potassium 4.2, chloride 94, bicarb 31, glucose 137, BUN 51, creatinine 2.0,BNP 381, white count 7.1, hemoglobin 10.1, hematocrit 28.6, platelets 432.Urinalysis negative for infection. Urine drug screen positive for benzodiazepines. Her chest x-ray showed a large right pleural effusion.EKG showed atrial pacing, V sensing, right bundle branch block. Positive delay between atrial pacing and ventricular response, rate 82 bmp.   Patient was admitted to the ICU due to acute  ischemic CVA complicated by acute hypoxemic respiratory failure.  Patient has an prolonged hospital course, self extubated and transferred to the medical ward. Neurologically and respiratory improving.   Assessment & Plan:   Active Problems:   Stroke [facility])   Acute CVA (cerebrovascular accident) ([place])   Stroke (cerebrum) (HCC)   Recurrent right pleural effusion   Middle cerebral artery embolism, left   Paroxysmal atrial fibrillation (HCC)   Anemia   Elevated INR   Alcoholic cirrhosis of liver with ascites ([place])   Acute hemorrhagic infarction of brain ([place])  1. Acute ischemic CVA/ left MCA infarct/ embolic, complicated with hemorrhagic conversion at the left insular. Patient had a prolonged hospital stay, her right hemiplegia slowly improved. Follow-up head CT showed a left inferior insular small intracranial hemorrhage. Will continue to hold on full anticoagulation, for now will continue aspirin, she will need follow-up CT in 4 weeks as an outpatient, to evaluate resumption of full anticoagulation. Echocardiography showed normal LV systolic function, but significantly decreased RV systolic function. Patient was seen by physical therapy, recommendations to continue therapy at inpatient rehabilitation or skilled nursing facility.   2. Acute hypoxic respiratory failure due to right sided heart failure acute on chronic,severe pulmonary hypertension,complicated by right side pleural effusion. (self extubation 5/7). Severe RV dilatation, and significant reduction in right systolic function, peak PA pressure is 71 mmHG. Patient does have severe pulmonary hypertension, right sided heart failure likely due to sarcoidosis. Patient was placed on oximetry monitoring, and received supplemental oxygen per nasal cannula, by the time of discharge her oximetry reads 99% on room air. Patient underwent ultrasound-guided thoracentesis on the right, obtaining 1.1 L of amber transudate fluid. She has been  resumed on diuresis, furosemide and metolazone. Patient is preload dependent due to her severe pulmonary hypertension, close follow-up needed as an outpatient.   3. Chronic atrial fibrillation, status post pacemaker, rate controlled. Continue rate control  with metoprolol and amiodarone, patient remained in a paced rhythm. Holding full anticoagulation due to intracranial hemorrhage, patient has been tolerating aspirin well.  4. Acute kidney injury with hyponatremia. Patient was diuresed carefully, and her creatinine has reached a plateau at 1.35. Today her potassium is 4.1, and her serum bicarbonate 25. Her serum sodium has improved to 136. Will resume torsemide and metolazone as per her home regimen. She need a close follow-up on kidney function and electrolytes. Documented urine output 825 ml over last 24 hours.   5. Liver cirrhosis, in the setting of history of alcohol abuse. Patient with no signs of encephalopathy, her INR has been elevated, last INR was 1.3. No significant ascites. It is very likely that she has congestive hepatopathy due to her severe right heart failure.   6. Type 2 diabetes mellitus. She was placed on insulin sliding scale for glucose coverage and monitoring, Her glucose remained well-controlled.  DVT prophylaxis: scd  Code Status:  full Family Communication: no family at the bedside Disposition Plan: SNF when bed available    Consultants:   Neurology   Procedures:     Antimicrobials:       Subjective: Patient is feeling better, dyspnea has improved, no chest pain, no nausea or vomiting. Last night has confusion, that has improved today.   Objective: Vitals:   12/12/17 2351 12/13/17 0243 12/13/17 0400 12/13/17 0737  BP: 118/74  122/60 125/65  Pulse: 81  80 80  Resp: 20  20 16   Temp: 97.6 F (36.4 C)  98 F (36.7 C) 97.7 F (36.5 C)  TempSrc: Oral  Oral Oral  SpO2: 98%  98% 99%  Weight:  80.9 kg (178 lb 5.6 oz)    Height:       No intake or output  data in the 24 hours ending 12/13/17 0958 Filed Weights   12/11/17 0500 12/12/17 0500 12/13/17 0243  Weight: 84.7 kg (186 lb 11.7 oz) 83.3 kg (183 lb 10.3 oz) 80.9 kg (178 lb 5.6 oz)    Examination:   General: decondtioned Neurology: Awake and alert, non focal  E ENT: no pallor, no icterus, oral mucosa moist Cardiovascular: No JVD. S1-S2 present, rhythmic, no gallops, rubs, or murmurs 3.6 systolic murmur at the right sternal border. ++/+++ pitting lower extremity edema. Pulmonary: decreased breath sounds at the right base, positive expiratory wheezing, no rhonchi or rales. Gastrointestinal. Abdomen with no organomegaly, non tender, no rebound or guarding Skin. No rashes Musculoskeletal: no joint deformities     Data Reviewed: I have personally reviewed following labs and imaging studies  CBC: Recent Labs  Lab 12/07/17 0334 12/08/17 1258 12/09/17 0450 12/10/17 0636  WBC 10.1 8.3 7.9 7.9  HGB 8.0* 9.2* 8.4* 8.4*  HCT 25.5* 28.5* 25.6* 25.3*  MCV 69.7* 69.9* 69.4* 69.3*  PLT 306 315 352 211   Basic Metabolic Panel: Recent Labs  Lab 12/07/17 0334  12/09/17 0450 12/10/17 0401 12/11/17 0654 12/12/17 1050 12/13/17 0731  NA 139   < > 134* 133* 134* 132* 136  K 4.5   < > 4.6 5.7* 4.2 4.8 4.1  CL 106   < > 100* 101 103 100* 100*  CO2 26   < > 22 24 21* 23 25  GLUCOSE 169*   < > 154* 130* 125* 171* 146*  BUN 25*   < > 29* 28* 25* 23* 23*  CREATININE 1.36*   < > 1.51* 1.38* 1.31* 1.35* 1.35*  CALCIUM 8.7*   < > 8.9  8.5* 8.7* 8.6* 8.5*  MG 1.9  --   --   --   --   --   --   PHOS 3.6  --   --   --   --   --   --    < > = values in this interval not displayed.   GFR: Estimated Creatinine Clearance: 38.6 mL/min (A) (by C-G formula based on SCr of 1.35 mg/dL (H)). Liver Function Tests: Recent Labs  Lab 12/09/17 0450  PROT 6.9   No results for input(s): LIPASE, AMYLASE in the last 168 hours. No results for input(s): AMMONIA in the last 168 hours. Coagulation  Profile: Recent Labs  Lab 12/07/17 0334 12/09/17 0450  INR 1.51 1.35   Cardiac Enzymes: No results for input(s): CKTOTAL, CKMB, CKMBINDEX, TROPONINI in the last 168 hours. BNP (last 3 results) No results for input(s): PROBNP in the last 8760 hours. HbA1C: No results for input(s): HGBA1C in the last 72 hours. CBG: Recent Labs  Lab 12/12/17 0615 12/12/17 1113 12/12/17 1530 12/12/17 2233 12/13/17 0611  GLUCAP 145* 157* 152* 230* 156*   Lipid Profile: No results for input(s): CHOL, HDL, LDLCALC, TRIG, CHOLHDL, LDLDIRECT in the last 72 hours. Thyroid Function Tests: No results for input(s): TSH, T4TOTAL, FREET4, T3FREE, THYROIDAB in the last 72 hours. Anemia Panel: No results for input(s): VITAMINB12, FOLATE, FERRITIN, TIBC, IRON, RETICCTPCT in the last 72 hours.    Radiology Studies: I have reviewed all of the imaging during this hospital visit personally     Scheduled Meds: . amiodarone  200 mg Oral Daily  . aspirin EC  81 mg Oral Daily  . feeding supplement (ENSURE ENLIVE)  237 mL Oral BID BM  . furosemide  40 mg Oral Daily  . insulin aspart  0-15 Units Subcutaneous TID WC  . mouth rinse  15 mL Mouth Rinse BID  . metolazone  2.5 mg Oral Once  . metoprolol succinate  25 mg Oral Daily  . pantoprazole  40 mg Oral Daily   Continuous Infusions: . sodium chloride Stopped (12/07/17 0600)     LOS: 10 days        Mauricio Gerome Apley, MD [facility] Pager [phone]

## 2017-12-13 NOTE — Progress Notes (Signed)
Physical Therapy Treatment Patient Details Name: Ana Gardner MRN: [id number] DOB: 1941/06/19 Today's Date: 12/13/2017    History of Present Illness 77 y.o. 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 No tPA given due to recent GI bleeding.  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    PT Comments    Patient up in chair upon arrival and agreeable to participate in therapy. Pt perseverating on breathing technique throughout session and with difficulty sequencing "in through nose, out through mouth".  Pt with fluctuating emotions during session and required assistance for safe transfers and gait. Given pt will not have adequate assistance at home upon d/c recommending SNF for further skilled PT services to maximize independence and safety with mobility.   Follow Up Recommendations  SNF     Equipment Recommendations  Other (comment)(TBD)    Recommendations for Other Services Rehab consult     Precautions / Restrictions Precautions Precautions: Fall Precaution Comments: monitor O2 sats Restrictions Weight Bearing Restrictions: No    Mobility  Bed Mobility               General bed mobility comments: pt OOB in chair upon arrival  Transfers Overall transfer level: Needs assistance Equipment used: Rolling walker (2 wheeled) Transfers: Sit to/from Stand Sit to Stand: Min assist         General transfer comment: assist to power up and for safety as pt tends to sit abruptly and without concern for position of chair    Ambulation/Gait Ambulation/Gait assistance: Min assist   Assistive device: Rolling walker (2 wheeled) Gait  Pattern/deviations: Step-through pattern;Decreased step length - right;Decreased step length - left;Decreased dorsiflexion - right;Decreased dorsiflexion - left;Wide base of support;Trunk flexed Gait velocity: decreased   General Gait Details: cues for breathing technique, posture, and safe use of AD; assist required for managing RW and to steady especially with turns; pt unable to navigate around objects in room or hallway without vc and assistance   Stairs             Wheelchair Mobility    Modified Rankin (Stroke Patients Only) Modified Rankin (Stroke Patients Only) Pre-Morbid Rankin Score: No symptoms Modified Rankin: Moderately severe disability     Balance Overall balance assessment: Needs assistance Sitting-balance support: Feet supported Sitting balance-Leahy Scale: Good     Standing balance support: During functional activity;Bilateral upper extremity supported Standing balance-Leahy Scale: Fair                              Cognition Arousal/Alertness: Awake/alert Behavior During Therapy: Anxious;Impulsive;Agitated(pt emotional during session) Overall Cognitive Status: Impaired/Different from baseline Area of Impairment: Orientation;Attention;Memory;Safety/judgement;Awareness;Problem solving                 Orientation Level: Situation Current Attention Level: Focused Memory: Decreased short-term memory Following Commands: Follows one step commands consistently Safety/Judgement: Decreased awareness of safety;Decreased awareness of deficits Awareness: Intellectual Problem Solving: Slow processing;Decreased initiation;Difficulty sequencing;Requires verbal cues General Comments: pt became emotional during session and tangential      Exercises      General Comments General comments (skin integrity, edema, etc.): SpO2 desat to 88% on RA with ambulation  Pertinent Vitals/Pain Pain Assessment: No/denies pain    Home Living                       Prior Function            PT Goals (current goals can now be found in the care plan section) Acute Rehab PT Goals PT Goal Formulation: With family Time For Goal Achievement: 12/18/17 Potential to Achieve Goals: Good Progress towards PT goals: Progressing toward goals    Frequency    Min 4X/week      PT Plan Discharge plan needs to be updated    Co-evaluation PT/OT/SLP Co-Evaluation/Treatment: Yes            AM-PAC PT "6 Clicks" Daily Activity  Outcome Measure  Difficulty turning over in bed (including adjusting bedclothes, sheets and blankets)?: Unable Difficulty moving from lying on back to sitting on the side of the bed? : A Lot Difficulty sitting down on and standing up from a chair with arms (e.g., wheelchair, bedside commode, etc,.)?: Unable Help needed moving to and from a bed to chair (including a wheelchair)?: A Little Help needed walking in hospital room?: A Little Help needed climbing 3-5 steps with a railing? : Total 6 Click Score: 11    End of Session Equipment Utilized During Treatment: Gait belt(O2 donned end of session) Activity Tolerance: Patient limited by fatigue Patient left: in chair;with call bell/phone within reach;with chair alarm set Nurse Communication: Mobility status PT Visit Diagnosis: Other symptoms and signs involving the nervous system (R29.898)     Time: 3614-4315 PT Time Calculation (min) (ACUTE ONLY): 24 min  Charges:  $Gait Training: 8-22 mins $Therapeutic Activity: 8-22 mins                    G Codes:       Earney Navy, PTA Pager: [phone]     Darliss Cheney 12/13/2017, 1:45 PM

## 2017-12-13 NOTE — Progress Notes (Signed)
  Speech Language Pathology Treatment:    Patient Details Name: Ana Gardner MRN: [id number] DOB: Nov 01, 1940 Today's Date: 12/13/2017 Time: 4163-8453 SLP Time Calculation (min) (ACUTE ONLY): 8 min  Assessment / Plan / Recommendation Clinical Impression  Skilled treatment session focused on cognition goals. SLP received pt in bed, asleep but easily aroused and agreeable to therapy. Pt more emotional this session than last session and SLP provided Mod A cues for anticipatory awareness and need for SNF. Pt able to demonstrate understanding in moment with support given.    HPI HPI: Ana Gardner is a 77 y.o. 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 No tPA given due to recent GI bleeding.  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 No MRI due to pacer. Intubated from 5/4-5/7 when she self extubated.       SLP Plan  Continue with current plan of care       Recommendations                   Follow up Recommendations: Skilled Nursing facility SLP Visit Diagnosis: Cognitive communication deficit (M46.803) Plan: Continue with current plan of care       George West 12/13/2017, 2:48 PM

## 2017-12-14 LAB — GLUCOSE, CAPILLARY
GLUCOSE-CAPILLARY: 139 mg/dL — AB (ref 65–99)
GLUCOSE-CAPILLARY: 167 mg/dL — AB (ref 65–99)
Glucose-Capillary: 164 mg/dL — ABNORMAL HIGH (ref 65–99)
Glucose-Capillary: 152 mg/dL — ABNORMAL HIGH (ref 65–99)

## 2017-12-14 LAB — BASIC METABOLIC PANEL
ANION GAP: 11 (ref 5–15)
BUN: 19 mg/dL (ref 6–20)
CO2: 25 mmol/L (ref 22–32)
CREATININE: 1.28 mg/dL — AB (ref 0.44–1.00)
Calcium: 9.1 mg/dL (ref 8.9–10.3)
Chloride: 100 mmol/L — ABNORMAL LOW (ref 101–111)
GFR, EST AFRICAN AMERICAN: 46 mL/min — AB (ref >60)
GFR, EST NON AFRICAN AMERICAN: 40 mL/min — AB (ref >60)
Glucose, Bld: 137 mg/dL — ABNORMAL HIGH (ref 65–99)
Potassium: 4.3 mmol/L (ref 3.5–5.1)
SODIUM: 136 mmol/L (ref 135–145)

## 2017-12-14 MED ORDER — ASPIRIN 81 MG PO TBEC: 81.0000 mg | DELAYED_RELEASE_TABLET | Freq: Every day | ORAL | 0 refills | Status: AC

## 2017-12-14 NOTE — Progress Notes (Signed)
Physical Therapy Treatment Patient Details Name: Ana Gardner MRN: [id number] DOB: 1940-12-08 Today's Date: 12/14/2017    History of Present Illness 77 y.o. 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 No tPA given due to recent GI bleeding.  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    PT Comments    Patient seen for activity progression. Patient extremely upset and emotionally labile at start of session. Wanting to go home, wanting to talk to daughter Ana Gardner. Max calming techniques and encouragement required. Patient was agreeable to ambulate in halls with assist, easily distracted. Current POC remains appropriate. Will continue to see and progress as tolerated.   Follow Up Recommendations  SNF     Equipment Recommendations  Other (comment)(TBD)    Recommendations for Other Services Rehab consult     Precautions / Restrictions Precautions Precautions: Fall Precaution Comments: monitor O2 sats Restrictions Weight Bearing Restrictions: No    Mobility  Bed Mobility Overal bed mobility: Needs Assistance Bed Mobility: Supine to Sit Rolling: Supervision     Sit to supine: Supervision   General bed mobility comments: Supervision for all aspects of be mobility, max multi modal cues to perform due to anxiety  Transfers Overall transfer level: Needs assistance Equipment used: Rolling walker (2 wheeled) Transfers: Sit to/from Stand Sit to Stand: Min assist         General transfer comment: Min assist to power up to standing, VCs for hand placement  Ambulation/Gait Ambulation/Gait assistance: Min assist Ambulation Distance (Feet):  90 Feet Assistive device: Rolling walker (2 wheeled) Gait Pattern/deviations: Step-through pattern;Decreased step length - right;Decreased step length - left;Decreased dorsiflexion - right;Decreased dorsiflexion - left;Wide base of support;Trunk flexed Gait velocity: decreased   General Gait Details: VCs to direct to task, max encouragement for mobility   Stairs             Wheelchair Mobility    Modified Rankin (Stroke Patients Only) Modified Rankin (Stroke Patients Only) Pre-Morbid Rankin Score: No symptoms Modified Rankin: Moderately severe disability     Balance Overall balance assessment: Needs assistance Sitting-balance support: Feet supported Sitting balance-Leahy Scale: Good     Standing balance support: During functional activity;Bilateral upper extremity supported Standing balance-Leahy Scale: Fair Standing balance comment: Patient able to static stand without UE support but can not perform dynamic activities without HHA or RW                            Cognition Arousal/Alertness: Awake/alert Behavior During Therapy: Anxious;Impulsive;Agitated(pt emotionally labile during session) Overall Cognitive Status: Impaired/Different from baseline Area of Impairment: Orientation;Attention;Memory;Safety/judgement;Awareness;Problem solving                 Orientation Level: Situation Current Attention Level: Focused Memory: Decreased short-term memory Following Commands: Follows one step commands consistently Safety/Judgement: Decreased awareness of safety;Decreased awareness of deficits Awareness: Intellectual Problem Solving: Slow processing;Decreased initiation;Difficulty sequencing;Requires verbal cues General Comments: pt became very labile throughout session, max cues to direct to task and calm patient anxiety      Exercises      General Comments        Pertinent Vitals/Pain Pain Assessment: No/denies pain    Home Living  Prior Function            PT Goals (current goals can now be found in the care plan section) Acute Rehab PT Goals Patient Stated Goal: to be independent via daughter PT Goal Formulation: With family Time For Goal Achievement: 12/18/17 Potential to Achieve Goals: Good Progress towards PT goals: Progressing toward goals    Frequency    Min 3X/week      PT Plan Discharge plan needs to be updated    Co-evaluation              AM-PAC PT "6 Clicks" Daily Activity  Outcome Measure  Difficulty turning over in bed (including adjusting bedclothes, sheets and blankets)?: Unable Difficulty moving from lying on back to sitting on the side of the bed? : A Lot Difficulty sitting down on and standing up from a chair with arms (e.g., wheelchair, bedside commode, etc,.)?: Unable Help needed moving to and from a bed to chair (including a wheelchair)?: A Little Help needed walking in hospital room?: A Little Help needed climbing 3-5 steps with a railing? : Total 6 Click Score: 11    End of Session Equipment Utilized During Treatment: Gait belt(O2 donned end of session) Activity Tolerance: Patient limited by fatigue Patient left: in bed;with call bell/phone within reach;with bed alarm set Nurse Communication: Mobility status PT Visit Diagnosis: Other symptoms and signs involving the nervous system (X73.532)     Time: 9924-2683 PT Time Calculation (min) (ACUTE ONLY): 22 min  Charges:  $Gait Training: 8-22 mins                    G Codes:       Alben Deeds, PT DPT  Board Certified Neurologic Specialist [phone]    Duncan Dull 12/14/2017, 9:51 AM

## 2017-12-14 NOTE — Discharge Summary (Signed)
Physician Discharge Summary  Ana Gardner RCV:[id number] DOB: [birth year] DOA: 12/03/2017  PCP: Karleen Hampshire., MD  Admit date: 12/03/2017 Discharge date: 12/15/2017  Admitted From: Home Disposition: SNF  Recommendations for Outpatient Follow-up:  1. Neurology follow-up for stroke 2. Cardiology follow-up for heart failure and pericardial effusion 3. Will need repeat imaging to reconsider restarting full anticoagulation 4. Please obtain BMP/CBC in one week 5. Please follow up on the following pending results: None   Discharge Condition: Stable CODE STATUS: Full code Diet recommendation: Heart healthy   Brief/Interim Summary:  Admission HPI written by [name], MD   CHIEF COMPLAINT:  Right sided weakness   HISTORY OF PRESENT ILLNESS:   77 y.o. 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 Patient walked into to the [facility] ED around 8am today with complaints of shortness of breath and edema in her bilateral lower extremities over the last month.    patient was discharged 11/29/17, with complaints of shortness of breath secondary to bilateral pleural effusion -right more than left, likely related to hypervolemic state from congestive heart failure and cirrhosis of liver. Ultrasound guided thoracentesis done 11/28/2017 with 1 L of pleural fluid drained right and another thoracentesis done on 11/29/2017 650 mL of fluid with drained from the left  pleural space. She was started on torsemide and metolazone. Patient presented to the ER, x-ray of the chest showed large recurrent right pleural effusion as well as underlying consolidation/atelectasis of the right lung.  Today around 8:30 AM in the ED patient became acutely aphasic with dense right hemiplegia as well as forced left gaze deviation. Emergent telemetry stroke was consulted at that time patient had NIHSS of 26. Immediate CT Noncon was done that shows infarct involving the high right parietal lobe and left frontal operculum. Patient could not undergo CT angiogram at that time due to respiratory status. , she was unable to follow commands or protect her airway and was intubated immediately. Patient was transferred immediately to [facility] for further stroke management.  On arrival to [facility] patient immediately went to CT. CT head/neck Angio Showed proximal posterior division left M2 occlusion, More distal anterior division left M2/M3 occlusion as well aso occluded left vertebral artery with minimal reconstitution in the neck. Neuro interventionalist was consulted immediately for endovascular revascularization  Patient underwent and underwent revascularization of the left MCA.  Patient brought back to the ICU intubated.    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 Patient had a prolonged hospital stay, her right hemiplegia slowly improved. Follow-up head CT showed a left inferior insular small intracranial hemorrhage. Will continue to hold on full anticoagulation, for now will continue aspirin, she will need follow-up CT in 4 weeks as an outpatient, to evaluate resumption of full anticoagulation. Echocardiography showed normal LV systolic function, but significantly decreased RV systolic function. Patient was seen by physical therapy, recommendations to continue therapy at inpatient  rehabilitation or skilled nursing facility.   Acute hypoxic respiratory failure due to  right sided heart failure acute on chronic,severe pulmonary hypertension,complicated by right side pleural effusion. (self extubation 5/7).Severe RV dilatation, and significant reduction in right systolic function, peak PA pressure is 71 mmHG. Patient does have severe pulmonary hypertension, right sided heart failure likely due to sarcoidosis. Patient was placed on oximetry monitoring, and received supplemental oxygen per nasal cannula, by the time of discharge her oximetry reads 99% on room air. Patient underwent ultrasound-guided thoracentesis on the right, obtaining 1.1 L of amber transudate fluid. She has been resumed on diuresis, furosemide and metolazone. Patient is preload dependent due to her severe pulmonary hypertension, close follow-up needed as an outpatient.   Chronic atrial fibrillation, status post pacemaker, rate controlled. Continue rate control with metoprolol and amiodarone, patient remained in a paced rhythm. Holding full anticoagulation due to intracranial hemorrhage, patient has been tolerating aspirin well.  Acute kidney injury with hyponatremia. Patient was diuresed carefully, and her creatinine has reached a plateau at 1.35. Resume home torsemide and metolazone on discharge.  Liver cirrhosis, in the setting of history of alcohol abuse. Patient with no signs of encephalopathy, her INR has been elevated, last INR was 1.3 which is improved. No significant ascites. It is very likely that she has congestive hepatopathy due to her severe right heart failure.   Type 2 diabetes mellitus. She was placed on insulin sliding scale for glucose coverage and monitoring, Her glucose remained well-controlled.  Pericardial effusion Chronic. Patient has a cardiologist as an outpatient. No evidence of tamponade. Outpatient follow-up.  Discharge Diagnoses:  Active Problems:   Stroke [facility])   Acute CVA  (cerebrovascular accident) ([place])   Stroke (cerebrum) (HCC)   Recurrent right pleural effusion   Middle cerebral artery embolism, left   Paroxysmal atrial fibrillation (HCC)   Anemia   Elevated INR   Alcoholic cirrhosis of liver with ascites ([place])   Acute hemorrhagic infarction of brain [facility])    Discharge Instructions  Discharge Instructions    Ambulatory referral to Neurology   Complete by:  As directed    Pt will follow up with Dr. Leonie Man at [facility] in about 4-6 weeks. Thanks.     Allergies as of 12/14/2017   Not on File     Medication List    STOP taking these medications   ELIQUIS 5 MG Tabs tablet Generic drug:  apixaban     TAKE these medications   amiodarone 200 MG tablet Commonly known as:  PACERONE Take 200 mg by mouth daily.   aspirin 81 MG EC tablet Take 1 tablet (81 mg total) by mouth daily.   metolazone 2.5 MG tablet Commonly known as:  ZAROXOLYN Take 2.5 mg by mouth. Twice weekly   metoprolol succinate 25 MG 24 hr tablet Commonly known as:  TOPROL-XL Take 25 mg by mouth daily.   pantoprazole 40 MG tablet Commonly known as:  PROTONIX Take 40 mg by mouth daily.   torsemide 20 MG tablet Commonly known as:  DEMADEX Take 20 mg by mouth daily.       Contact information for follow-up providers    Garvin Fila, MD. Schedule an appointment as soon as possible for a visit in 4 week(s).   Specialties:  Neurology, Radiology Contact information: [address] [place] [address] [phone]            Contact information for after-discharge care    Destination    [facility] [place] [facility] .   Service:  Skilled Nursing Contact information: [phone]  [place] [phone]                 Not on File  Consultations:  Neurology   Procedures/Studies: Ct Angio Head W Or Wo Contrast  Result Date: 12/03/2017 CLINICAL DATA:  Code stroke.  Acute onset of right-sided weakness. EXAM:  CT ANGIOGRAPHY HEAD AND NECK TECHNIQUE: Multidetector CT imaging of the head and neck was performed using the standard protocol during bolus administration of intravenous contrast. Multiplanar CT image reconstructions and MIPs were obtained to evaluate the vascular anatomy. Carotid stenosis measurements (when applicable) are obtained utilizing NASCET criteria, using the distal internal carotid diameter as the denominator. CONTRAST:  78mL ISOVUE-370 IOPAMIDOL (ISOVUE-370) INJECTION 76% COMPARISON:  CT head without contrast from the same day at 8:46 a.m. at [facility], [facility]. FINDINGS: CT HEAD FINDINGS Brain: Repeat noncontrast CT of the head again demonstrates remote infarcts of the left frontal operculum, left caudate head, and high right parietal lobe. Remote posterior left cerebellar infarcts are again seen. Anterior left insular hypoattenuation may be slightly worse than on the prior exam. The precentral gyrus is intact. Vascular: Atherosclerotic calcifications are again noted within the cavernous internal carotid arteries bilaterally. There is no hyperdense vessel. Skull: Calvarium is intact. No focal lytic or blastic lesions are present. Sinuses: Paranasal sinuses and mastoid air cells are clear. Orbits: Remote blowout fracture is again noted in the right orbit. Right lens replacement is present. Globes and orbits are otherwise within normal limits. Review of the MIP images confirms the above findings CTA NECK FINDINGS Aortic arch: A 3 vessel arch configuration is present. Atherosclerotic calcifications are present at the origins of the great vessels without a significant stenosis. There is no aneurysm. Right carotid system: The right common carotid artery is within normal limits. Atherosclerotic changes are noted in the proximal right ICA. More distal right internal carotid artery is within normal limits. Left carotid system: The left common carotid artery is within normal limits.  Atherosclerotic calcifications are present at the carotid bifurcation proximal left ICA without significant stenosis. There is mild tortuosity of the more distal left ICA without significant stenosis. Vertebral arteries: Atherosclerotic calcifications are present at the origin of the right vertebral artery without significant stenosis. The left vertebral artery is occluded at its origin. There is very faint reconstitution at the C2-3 level without intraluminal contrast at the foramen magnum. Skeleton: Rightward curvature is present in the lower cervical spine, compensating for leftward curvature in the upper thoracic spine. Other neck: Heterogeneous thyroid is present without a dominant lesion. Patient is intubated. No significant cervical adenopathy is present. Salivary glands are within normal limits. Upper chest: A large right pleural effusion is again seen. The moderate left-sided pleural effusion is present. There is partial collapse of the right upper lobe. Possible soft tissue fullness is present at the right hila. Recommend dedicated CT of the chest with contrast as tolerated. Review of the MIP images confirms the above findings CTA HEAD FINDINGS Anterior circulation: Atherosclerotic calcifications are present within the cavernous internal carotid arteries bilaterally. There is no significant luminal stenosis relative to the more distal vessels through the ICA terminus. The M1 segments are intact bilaterally. No significant proximal occlusion is present on the right. There is moderate attenuation of superior and posterior M2 divisions. The left M1 segment is intact. Proximal superior left M2 and M3 segment is occluded or highly stenotic. The posterior left M2 division is not visualized. Posterior circulation: The right vertebral  artery feeds the basilar. PICA origin is visualized. The basilar artery is small centrally terminating at the superior cerebellar arteries. Fetal type posterior cerebral arteries are  present bilaterally with moderate distal segmental stenoses but no significant proximal stenosis or occlusion. Venous sinuses: Dural sinuses are patent. Straight sinus and deep cerebral veins are intact. Cortical veins are unremarkable. Anatomic variants: Fetal type posterior cerebral arteries bilaterally. Delayed phase: Postcontrast images are not performed in the acute setting. Review of the MIP images confirms the above findings IMPRESSION: 1. Proximal posterior division left M2 occlusion. 2. More distal anterior division left M2/M3 occlusion. 3. Extensive intracranial small vessel disease. 4. Occluded left vertebral artery with minimal reconstitution in the neck. 5. Fetal type posterior cerebral arteries bilaterally. 6. Atherosclerotic changes at the carotid bifurcations and cavernous internal carotid arteries bilaterally without other significant stenosis. 7. Aortic Atherosclerosis (ICD10-I70.0). Calcifications involve the great vessel origins without significant stenoses. 8. Large bilateral pleural effusions, right greater than left. 9. Parenchymal volume loss in the right upper lobe likely related to the effusions. Central mass lesion not excluded. 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 Recommend dedicated CT the chest with contrast as the patient's condition allows. 10. These results were called by telephone at the time of interpretation on 12/03/2017 at 11:02 am to Dr. Roland Rack , who verbally acknowledged these results. Electronically Signed   By: San Morelle M.D.   On: 12/03/2017 11:05   Dg Chest 1 View  Result Date: 12/09/2017 CLINICAL DATA:  77 year old female with shortness of breath. EXAM: CHEST  1 VIEW COMPARISON:  12/06/2017 portable chest and earlier. FINDINGS: AP view at 1726 hours. Extubated. Enteric tube removed. Right IJ central line removed. The patient remains rotated to the right. Stable cardiomegaly and mediastinal contours. Pericardial effusion was  demonstrated by CT on 12/04/2017. Stable left chest dual lead cardiac pacemaker. Improved right lung ventilation since that time although continued bilateral veiling lower lung pulmonary opacities and dense retrocardiac opacity. No pneumothorax. The upper lobe pulmonary vascularity is normal. Negative visible bowel gas pattern. IMPRESSION: 1. Extubated.  Enteric tube and right IJ central line removed. 2. Improved ventilation since 12/06/2017, but persistent small to moderate bilateral pleural effusions with bilateral lower lobe collapse or consolidation. 3. Cardiomegaly. Moderate pericardial effusion demonstrated recently on 12/04/2017 CT. Electronically Signed   By: Genevie Ann M.D.   On: 12/09/2017 17:58   Ct Head Wo Contrast  Result Date: 12/07/2017 CLINICAL DATA:  Followup intracranial hemorrhage EXAM: CT HEAD WITHOUT CONTRAST TECHNIQUE: Contiguous axial images were obtained from the base of the skull through the vertex without intravenous contrast. COMPARISON:  12/04/2017 and previous. FINDINGS: Brain: Small area of post treatment hemorrhage in the left insular region continues to diminish, measuring 8 by 14 mm today. No progressive or new hemorrhage. Subacute infarction in the left insular and posterior frontal region appears the same. Evidence of increased swelling. No shift. No other acute infarction. Atrophy and chronic small-vessel ischemic changes elsewhere appear the same. Vascular: There is atherosclerotic calcification of the major vessels at the base of the brain. Skull: Negative Sinuses/Orbits: Clear sinuses. Old orbital blowout fracture on the right. Other: None IMPRESSION: No worsening or progressive findings. Diminishing conspicuity of an 8 by 14 mm region of post treatment hemorrhage in the left insula. No evidence of infarct extension. Electronically Signed   By: Nelson Chimes M.D.   On: 12/07/2017 06:38   Ct Head Wo Contrast  Result Date: 12/04/2017 CLINICAL DATA:  Follow-up exam  for acute  stroke, status post catheter directed revascularization. EXAM: CT HEAD WITHOUT CONTRAST TECHNIQUE: Contiguous axial images were obtained from the base of the skull through the vertex without intravenous contrast. COMPARISON:  Prior CT from earlier the same day. FINDINGS: Brain: Previously noted hyperdensity at the level of the left insula again seen, measuring 10 x 12 x 14 mm (transverse by AP by craniocaudad). Overall, this is similar in size with slightly decreased attenuation as compared to previous. Given persistence, hemorrhage is favored, although a degree of contrast staining may be contributory. Evolving acute left MCA territory infarcts within the adjacent all left frontal operculum and posterior frontal region again seen, slightly more apparent on current exam. No mass effect. No other acute intracranial hemorrhage. No other acute large vessel territory infarct. No mass lesion or midline shift. No hydrocephalus. No extra-axial fluid collection. Underlying atrophy with chronic small vessel ischemic changes again noted. Vascular: No new hyperdense vessel. Scattered vascular calcifications noted within the carotid siphons. Skull: No acute scalp soft tissue abnormality.  Calvarium intact. Sinuses/Orbits: Globes and orbital soft tissues within normal limits. Small air-fluid levels noted within the sphenoid sinuses. Scattered mucosal thickening throughout the ethmoidal air cells and maxillary sinuses. Trace right mastoid effusion. Other: None. IMPRESSION: 1. Persistent hyperdensity at the left insula, similar in size measuring up to 14 mm with slightly decreased attenuation. Hemorrhage is favored. 2. Additional multifocal areas of evolving left MCA territory infarction, stable in distribution as compared to previous. 3. No other new acute intracranial abnormality. Electronically Signed   By: Jeannine Boga M.D.   On: 12/04/2017 14:29   Ct Head Wo Contrast  Result Date: 12/04/2017 CLINICAL DATA:  77 y/o  F; stroke post catheter thrombectomy for follow-up. EXAM: CT HEAD WITHOUT CONTRAST TECHNIQUE: Contiguous axial images were obtained from the base of the skull through the vertex without intravenous contrast. COMPARISON:  11/04/2017 CT head, CTA head, angiogram head. FINDINGS: Brain: Amorphous density within the left anterior insula measuring 10 x 9 x 16 mm (AP x ML x CC series 6, image 17 and series 5, image 46). Hypoattenuation within the left lateral frontal lobe, frontal operculum, and insula. Small chronic infarcts in the posterior left cerebellum, left caudate head, right parietal lobe, and right frontal operculum. No hydrocephalus, extra-axial collection, or effacement of basilar cisterns. Vascular: Calcific atherosclerosis of carotid siphons. Skull: Normal. Negative for fracture or focal lesion. Sinuses/Orbits: Chronic right lamina papyracea fracture. Partial opacification of paranasal sinuses likely due to recent intubation. Other: None. IMPRESSION: 1. Amorphous density in left anterior insula measuring up to 16 mm, probably enhancing infarction, although hemorrhage is possible. Follow-up is recommended to ensure stability. 2. Additional area of hypoattenuation within left lateral frontal lobe, frontal operculum, insula compatible with evolving infarction. 3. Multiple additional stable chronic infarcts as above. These results were called by telephone at the time of interpretation on 12/04/2017 at 5:31 am to Dr. Rory Percy, who verbally acknowledged these results. Electronically Signed   By: Kristine Garbe M.D.   On: 12/04/2017 05:33   Ct Angio Neck W Or Wo Contrast  Result Date: 12/03/2017 CLINICAL DATA:  Code stroke.  Acute onset of right-sided weakness. EXAM: CT ANGIOGRAPHY HEAD AND NECK TECHNIQUE: Multidetector CT imaging of the head and neck was performed using the standard protocol during bolus administration of intravenous contrast. Multiplanar CT image reconstructions and MIPs were obtained to  evaluate the vascular anatomy. Carotid stenosis measurements (when applicable) are obtained utilizing NASCET criteria, using the distal internal carotid diameter  as the denominator. CONTRAST:  28mL ISOVUE-370 IOPAMIDOL (ISOVUE-370) INJECTION 76% COMPARISON:  CT head without contrast from the same day at 8:46 a.m. at [facility], [facility]. FINDINGS: CT HEAD FINDINGS Brain: Repeat noncontrast CT of the head again demonstrates remote infarcts of the left frontal operculum, left caudate head, and high right parietal lobe. Remote posterior left cerebellar infarcts are again seen. Anterior left insular hypoattenuation may be slightly worse than on the prior exam. The precentral gyrus is intact. Vascular: Atherosclerotic calcifications are again noted within the cavernous internal carotid arteries bilaterally. There is no hyperdense vessel. Skull: Calvarium is intact. No focal lytic or blastic lesions are present. Sinuses: Paranasal sinuses and mastoid air cells are clear. Orbits: Remote blowout fracture is again noted in the right orbit. Right lens replacement is present. Globes and orbits are otherwise within normal limits. Review of the MIP images confirms the above findings CTA NECK FINDINGS Aortic arch: A 3 vessel arch configuration is present. Atherosclerotic calcifications are present at the origins of the great vessels without a significant stenosis. There is no aneurysm. Right carotid system: The right common carotid artery is within normal limits. Atherosclerotic changes are noted in the proximal right ICA. More distal right internal carotid artery is within normal limits. Left carotid system: The left common carotid artery is within normal limits. Atherosclerotic calcifications are present at the carotid bifurcation proximal left ICA without significant stenosis. There is mild tortuosity of the more distal left ICA without significant stenosis. Vertebral arteries: Atherosclerotic calcifications are  present at the origin of the right vertebral artery without significant stenosis. The left vertebral artery is occluded at its origin. There is very faint reconstitution at the C2-3 level without intraluminal contrast at the foramen magnum. Skeleton: Rightward curvature is present in the lower cervical spine, compensating for leftward curvature in the upper thoracic spine. Other neck: Heterogeneous thyroid is present without a dominant lesion. Patient is intubated. No significant cervical adenopathy is present. Salivary glands are within normal limits. Upper chest: A large right pleural effusion is again seen. The moderate left-sided pleural effusion is present. There is partial collapse of the right upper lobe. Possible soft tissue fullness is present at the right hila. Recommend dedicated CT of the chest with contrast as tolerated. Review of the MIP images confirms the above findings CTA HEAD FINDINGS Anterior circulation: Atherosclerotic calcifications are present within the cavernous internal carotid arteries bilaterally. There is no significant luminal stenosis relative to the more distal vessels through the ICA terminus. The M1 segments are intact bilaterally. No significant proximal occlusion is present on the right. There is moderate attenuation of superior and posterior M2 divisions. The left M1 segment is intact. Proximal superior left M2 and M3 segment is occluded or highly stenotic. The posterior left M2 division is not visualized. Posterior circulation: The right vertebral artery feeds the basilar. PICA origin is visualized. The basilar artery is small centrally terminating at the superior cerebellar arteries. Fetal type posterior cerebral arteries are present bilaterally with moderate distal segmental stenoses but no significant proximal stenosis or occlusion. Venous sinuses: Dural sinuses are patent. Straight sinus and deep cerebral veins are intact. Cortical veins are unremarkable. Anatomic variants:  Fetal type posterior cerebral arteries bilaterally. Delayed phase: Postcontrast images are not performed in the acute setting. Review of the MIP images confirms the above findings IMPRESSION: 1. Proximal posterior division left M2 occlusion. 2. More distal anterior division left M2/M3 occlusion. 3. Extensive intracranial small vessel disease. 4. Occluded left  vertebral artery with minimal reconstitution in the neck. 5. Fetal type posterior cerebral arteries bilaterally. 6. Atherosclerotic changes at the carotid bifurcations and cavernous internal carotid arteries bilaterally without other significant stenosis. 7. Aortic Atherosclerosis (ICD10-I70.0). Calcifications involve the great vessel origins without significant stenoses. 8. Large bilateral pleural effusions, right greater than left. 9. Parenchymal volume loss in the right upper lobe likely related to the effusions. Central mass lesion not excluded. 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 Recommend dedicated CT the chest with contrast as the patient's condition allows. 10. These results were called by telephone at the time of interpretation on 12/03/2017 at 11:02 am to Dr. Roland Rack , who verbally acknowledged these results. Electronically Signed   By: San Morelle M.D.   On: 12/03/2017 11:05   Ct Chest Wo Contrast  Addendum Date: 12/04/2017   ADDENDUM REPORT: 12/04/2017 17:04 ADDENDUM: The upper abdominal section of the original report was inadvertently not included and is dictated below. UPPER ABDOMEN: Liver margin appears irregular. Decreased attenuation in the dome is likely due to dilated hepatic veins. Stones are seen in the gallbladder. Probable adrenal thickening. Visualized portions of the kidneys, spleen, pancreas and stomach are grossly unremarkable. Upper abdominal lymph nodes do not appear enlarged by CT size criteria. Ascites. IMPRESSION: 8. Marginal irregularity of the liver is indicative of cirrhosis. 9.        Cholelithiasis. 10.     Ascites. Electronically Signed   By: Lorin Picket M.D.   On: 12/04/2017 17:04   Result Date: 12/04/2017 CLINICAL DATA:  Pleural effusions. EXAM: CT CHEST WITHOUT CONTRAST TECHNIQUE: Multidetector CT imaging of the chest was performed following the standard protocol without IV contrast. COMPARISON:  Chest radiograph 12/03/2017 and CT chest 01/01/2017. FINDINGS: Cardiovascular: Right IJ central line terminates in the SVC. Atherosclerotic calcification of the arterial vasculature, including coronary arteries. Pulmonary arteries are enlarged. Heart is moderately enlarged with a moderate pericardial effusion. Mediastinum/Nodes: Low internal jugular and mediastinal adenopathy measures up to 1.1 cm in the right paratracheal station. There are calcified mediastinal and bihilar lymph nodes. Distal periesophageal lymph node measures 11 mm, as before. No axillary adenopathy. Lungs/Pleura: Image quality is degraded by respiratory motion. Large right pleural effusion and adjacent collapse/consolidation obscure the majority of the right hemithorax. Nodular areas in the apex of the left upper lobe are grossly stable. Scattered areas of peribronchovascular ground-glass in the left upper lobe, possibly infectious or inflammatory in etiology. Moderate left pleural effusion with collapse/consolidation in the left lower lobe. Endotracheal tube terminates at the orifice of the left mainstem bronchus. Musculoskeletal: Degenerative changes in the spine. No worrisome lytic or sclerotic lesions. IMPRESSION: 1. Endotracheal tube is low lying, at the orifice of the left mainstem bronchus. Pulling back 3-4 cm would likely better position the tip above the carina. 2. Large right pleural effusion with collapse/consolidation in the adjacent right lung. 3. Moderate left pleural effusion with collapse/consolidation in the left lower lobe. 4. Scattered areas of nodularity and ground-glass in the left upper lobe, poorly  evaluated due to respiratory motion. 5. Moderate pericardial effusion. 6. Aortic atherosclerosis (ICD10-170.0). Coronary artery calcification. 7. Enlarged pulmonary arteries, indicative of pulmonary arterial hypertension. Electronically Signed: By: Lorin Picket M.D. On: 12/04/2017 16:47   [place]  Result Date: 12/06/2017 INDICATION: Global aphasia.  Right-sided hemiplegia, left gaze deviation. EXAM: 1. EMERGENT LARGE VESSEL OCCLUSION THROMBOLYSIS (anterior CIRCULATION) COMPARISON:  CT angiogram of the head and neck of 12/03/2017. MEDICATIONS: Ancef 2 g IV antibiotic  was administered within 1 hour of the procedure. ANESTHESIA/SEDATION: General anesthesia. CONTRAST:  Isovue 300 approximately 90 cc. FLUOROSCOPY TIME:  Fluoroscopy Time: 40 minutes 12 seconds (2313 mGy). COMPLICATIONS: None immediate. TECHNIQUE: Emergency consent was obtained by 2 physicians involved in the care of this patient. No family members were available or could be reached on the telephone numbers provided. The patient was then put under general anesthesia by the Department of Anesthesiology at [facility]. The right groin was prepped and draped in the usual sterile fashion. Thereafter using modified Seldinger technique, transfemoral access into the right common femoral artery was obtained without difficulty. Over a 0.035 inch guidewire a 5 French Pinnacle sheath was inserted. Through this, and also over a 0.035 inch guidewire a 5 [place] JB 1 catheter was advanced to the aortic arch region and selectively positioned in the left common carotid artery. FINDINGS: The left common carotid arteriogram demonstrates the left external carotid artery and its major branches to be widely patent. The left internal carotid artery at the bulb to the cranial skull base also demonstrates wide patency with moderate tortuosity involving the distal 1/3. No evidence of stenosis or of kinking was seen. The petrous, cavernous and supraclinoid  segments demonstrate wide patency. A dominant left posterior communicating artery is seen opacifying the left posterior cerebral artery distribution. The left anterior cerebral artery is seen to opacify into the capillary and venous phases. 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 The delayed arterial phase demonstrates partial retrograde opacification of the distal distribution of this occluded vessel from the collaterals arising from the inferior division, and also the left pericallosal artery. PROCEDURE: The diagnostic JB 1 catheter in the left common carotid artery was then exchanged over a 0.035 inch 300 cm Rosen exchange guidewire for an 8 French 55 cm Brite tip neurovascular sheath using biplane roadmap technique and constant fluoroscopic guidance. Good aspiration obtained from the side port of the neurovascular sheath. This was then connected to continuous heparinized saline infusion. Over the 300 cm Rosen exchange guidewire, an 8 [place] 85 cm FlowGate balloon guide catheter which had been prepped with 50% contrast and 50% heparinized saline infusion was then positioned just proximal to the left common carotid bifurcation. The guidewire was removed. Good aspiration obtained from the hub of the 8 [place] FlowGate guide catheter. A gentle contrast injection demonstrated no evidence of spasms, dissections or of intraluminal filling defects. Over a 0.035 inch Roadrunner guidewire, using biplane roadmap technique and constant fluoroscopic guidance, this was then advanced to the middle 1/3 of the left internal carotid artery. Again the guidewire was removed. Good aspiration obtained from the hub of the [facility] guide catheter. A gentle contrast injection demonstrated no evidence of spasms, dissections or of  intraluminal filling defects. No change was seen in the intracranial circulation. At this time, in a coaxial manner and with constant heparinized saline infusion using biplane roadmap technique and constant fluoroscopic guidance, a Trevo ProVue 021 microcatheter was then advanced over a 0.014 inch Softip Synchro micro guidewire to the distal end of the FlowGate guide catheter. With the micro guidewire leading with a J-tip configuration, the combination was then navigated without difficulty to the supraclinoid left ICA. The wire was then advanced using a torque device into the left middle cerebral artery and super selectively advanced through the occluded M2  branch of the superior division M2 M3 region followed by the microcatheter. The guidewire was removed. Good aspiration obtained from the hub of the microcatheter. A gentle contrast injection demonstrated robust distal flow. A EmboTrap 5 mm x 33 mm retrieval device was then advanced in a coaxial manner with constant heparinized saline infusion using biplane roadmap technique and constant fluoroscopic guidance to the distal end of the microcatheter. The proximal and the distal landing zones were then defined. The O ring on the delivery microcatheter was then loosened. With slight forward gentle traction with the right hand on the delivery micro guidewire, with the left hand the delivery microcatheter was then retrieved deploying the retrieval device. Control arteriogram performed through the 8 [place] FlowGate guide catheter in the left internal carotid artery demonstrated a TICI 2b revascularization. The proximal portion of the retrieval device was then captured in the microcatheter. Proximal flow arrest was then initiated by inflating the balloon of the [facility] guide catheter in the left internal carotid artery. Thereafter using constant aspiration with a 60 mL syringe at the hub of the [facility] guide catheter, the combination of the retrieval device in the  microcatheter were then gently retrieved and removed. Aspiration was continued as the balloon was then deflated. Brisk back bleed of blood was noted at the hub of the [facility] guide catheter. A control arteriogram performed through the FlowGate 8 [place] guide catheter demonstrated no significant change in the occluded M2 superior division. This prompted a second pass using the above combination as described above. Again access was obtained distal to the occluded artery in the M2 superior division followed by the placement of the microcatheter. After having verified safe position of tip of the microcatheter, the EmboTrap 5 mm x 33 mm retrieval device was then deployed as described above. A control arteriogram performed after deployment revealed continued occlusion of the superior division M2 branch. However, with proximal flow arrest as described above, the combination of the retrieval device and the microcatheter were then retrieved. Aspiration with a 60 mL syringe at the hub of the [facility] guide catheter was continued as the balloon was then deflated. The aspirate did not contain any clots. The interstices of the retrieval device were also devoid of any clot debris. A third pass was then made at this time with the aid of an intermediate 5 French 115 cm Catalyst guide catheter inside of which was an [employer] microcatheter. This combination was then advanced over a 0.014 inch Softip Synchro micro guidewire to the supraclinoid left ICA. Access into the occluded M2 superior division of the right middle cerebral artery was then made with the micro guidewire followed by the microcatheter. The micro guidewire was then removed. Good aspiration was obtained at the hub of the microcatheter. Gentle contrast injection demonstrated good antegrade distal flow. The proximal and the distal landing zone of the retrieval device was then again defined. The O ring on the delivery microcatheter was loosened. With slight gentle  traction with the right hand on the delivery micro guidewire, with the left hand the delivery microcatheter was retrieved unsheathing the retrieval device. A control arteriogram performed through the 5 French intermediary Catalyst guide catheter in the supraclinoid left ICA demonstrated a TICI 3 revascularization. The combination of the 5 [place] Catalyst guide catheter with the microcatheter and the retrieval device were then retrieved and removed as constant aspiration was applied with a 60 mL syringe at the hub of the [facility] guide catheter in the left internal carotid artery,  and also with a 25 mL syringe on the 5 [place] Catalyst guide catheter. The tip of the Catalyst guide catheter was now at the proximal portion of the retrieval device. The combination was then retrieved and removed as constant aspiration was continued as flow arrest was reversed in the left internal carotid artery. A few specks of clot were seen in the aspirate. The retrieval device was devoid of any clots. After having established free back bleed at the hub of the [facility] guide catheter in the left internal carotid artery, a control arteriogram performed through the guide catheter demonstrated a TICI 3 reperfusion. No evidence of extravasation or of mass-effect on the major vessel was seen. The left posterior communicating artery and the left anterior cerebral artery continued to demonstrate wide patency in their entirety. The FlowGate guide catheter and the 8 French neurovascular sheath were then removed over a J-tip guidewire for an 8 [place] Pinnacle sheath. This was then removed with successful hemostasis in the right groin puncture site. The right groin appeared soft without evidence of bleeding or hematoma. Throughout the procedure, the patient's blood pressure and neurological status remained stable. The patient's distal pulses remained Dopplerable in the posterior tibials and dorsalis pedis unchanged from prior to the procedure. A  Dyna CT of the brain performed on the table demonstrated no gross evidence of intracranial hemorrhages. The ventricles appeared symmetrical and unchanged. The patient was then transported to the PACU intubated prior to being sent to the neuro ICU. IMPRESSION: Status post endovascular complete revascularization of occluded prominent superior division of the left middle cerebral artery using 3 passes with the EmboTrap 5 mm x 33 mm retrieval device achieving a TICI 3 revascularization. PLAN: Transferred to the PACU and neuro ICU for further management. Electronically Signed   By: Luanne Bras M.D.   On: 12/05/2017 09:28   Dg Chest Port 1 View  Result Date: 12/06/2017 CLINICAL DATA:  Respiratory failure, history of stroke EXAM: PORTABLE CHEST 1 VIEW COMPARISON:  Portable chest x-ray of 12/04/2017 FINDINGS: The tip of the endotracheal tube is approximately 3.5 cm above the carina. There is little change in moderate cardiomegaly with effusions right larger than left and resulting basilar atelectasis. Mild pulmonary vascular congestion cannot be excluded. Permanent pacemaker remains. Right IJ central venous line tip overlies the mid lower SVC. IMPRESSION: 1. Tip of endotracheal tube 3.5 cm above the carina. 2. Little change in bilateral effusions right much larger than left with basilar volume loss and cardiomegaly. Electronically Signed   By: Ivar Drape M.D.   On: 12/06/2017 09:45   Dg Chest Port 1 View  Result Date: 12/04/2017 CLINICAL DATA:  Central line placement EXAM: PORTABLE CHEST 1 VIEW COMPARISON:  Chest radiograph from earlier today. 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 Endotracheal tube tip is 2.4 cm above the carina. Right internal jugular central venous catheter terminates in the middle third of the SVC. Stable cardiomediastinal silhouette with mild cardiomegaly. No pneumothorax. Moderate right pleural effusion, decreased. Stable small left pleural effusion. Improved  aeration in the right lung with persistent right greater than left bibasilar lung opacities, stable on the left and decreased on the right. No pulmonary edema. IMPRESSION: 1. Right internal jugular central venous catheter terminates in the middle third of the SVC. Well-positioned endotracheal tube. 2. Moderate right pleural effusion, decreased. Improved aeration in the right lung. 3. Stable small left pleural effusion. 4. Right greater than left bibasilar lung opacities, decreased on the right and stable on  the left. Electronically Signed   By: Ilona Sorrel M.D.   On: 12/04/2017 11:04   Dg Chest Port 1 View  Result Date: 12/04/2017 CLINICAL DATA:  Respiratory failure EXAM: PORTABLE CHEST 1 VIEW COMPARISON:  Chest radiograph from one day prior. FINDINGS: Right rotated chest radiograph. Endotracheal tube tip is 1.9 cm above the carina. 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 Stable cardiomediastinal silhouette with mild cardiomegaly. No pneumothorax. Worsening essentially complete opacification of the right hemithorax. Small stable left pleural effusion. No overt pulmonary edema in the aerated left lung. IMPRESSION: 1. Endotracheal tube tip 1.9 cm above the carina. 2. Worsening essentially complete opacification of the right hemithorax, which could represent any combination of right pleural effusion, right lung atelectasis, pneumonia or mass. 3. Stable small left pleural effusion. Electronically Signed   By: Ilona Sorrel M.D.   On: 12/04/2017 08:41   Dg Chest Portable 1 View  Result Date: 12/03/2017 CLINICAL DATA:  Respiratory failure and status post intubation. Status post left thoracentesis on 11/29/2017 and right thoracentesis on 11/28/2017. EXAM: PORTABLE CHEST 1 VIEW COMPARISON:  11/29/2017 and 11/25/2017 chest x-rays at [facility] FINDINGS: Stable cardiac enlargement and appearance of dual-chamber pacemaker. Endotracheal tube present with the tip approximately 2 cm above the  carina. Since the prior chest x-ray, there is significant volume loss of the right lung with likely component recurrent pleural fluid as well as consolidation/atelectasis. The left lung shows no evidence of airspace disease, edema or pleural fluid. No pneumothorax identified. IMPRESSION: 1. Endotracheal tube tip approximately 2 cm above the carina. 2. Significant volume loss of right lung since prior chest x-ray with recurrent right pleural effusion as well as underlying consolidation/atelectasis of the right lung. Electronically Signed   By: Aletta Edouard M.D.   On: 12/03/2017 09:55   Dg Abd Portable 1v  Result Date: 12/05/2017 CLINICAL DATA:  Status post nasogastric tube placement. The patient is intubated and has sustained a CVA. History of CHF. EXAM: PORTABLE ABDOMEN - 1 VIEW COMPARISON:  Chest x-ray of Dec 04, 2017 FINDINGS: There is volume loss on the right. There is increased density in the retrocardiac region on the left. There is dense calcification in the mitral valvular annulus. The gas pattern in the upper abdomen is unremarkable. The esophagogastric tube tip in proximal port project in the region of the gastric body. The endotracheal tube tip lies approximately 3 cm above the carina. The right internal jugular venous catheter tip projects over the midportion of the SVC. The ICD is in stable position. The heart is top-normal in size. The pulmonary vascularity is normal. There is calcification in the wall of the aortic arch. IMPRESSION: Improved aeration of the right lung. Persistent right pleural effusion and right basilar atelectasis or pneumonia. Persistent left lower lobe atelectasis or pneumonia. No pulmonary edema. The support tubes including the newly placed esophagogastric tube are in reasonable position. Thoracic aortic atherosclerosis. Electronically Signed   By: David  Martinique M.D.   On: 12/05/2017 11:21   Ir Percutaneous Art Thrombectomy/infusion Intracranial Inc Diag Angio  Result  Date: 12/06/2017 INDICATION: Global aphasia.  Right-sided hemiplegia, left gaze deviation. EXAM: 1. EMERGENT LARGE VESSEL OCCLUSION THROMBOLYSIS (anterior CIRCULATION) COMPARISON:  CT angiogram of the head and neck of 12/03/2017. MEDICATIONS: Ancef 2 g IV antibiotic was administered within 1 hour of the procedure. ANESTHESIA/SEDATION: General anesthesia. CONTRAST:  Isovue 300 approximately 90 cc. FLUOROSCOPY TIME:  Fluoroscopy Time: 40 minutes 12 seconds (2313 mGy). COMPLICATIONS: None immediate. TECHNIQUE: Emergency consent  was obtained by 2 physicians involved in the care of this patient. No family members were available or could be reached on the telephone numbers provided. The patient was then put under general anesthesia by the Department of Anesthesiology at [facility]. The right groin was prepped and draped in the usual sterile fashion. Thereafter using modified Seldinger technique, transfemoral access into the right common femoral artery was obtained without difficulty. Over a 0.035 inch guidewire a 5 French Pinnacle sheath was inserted. Through this, and also over a 0.035 inch guidewire a 5 [place] JB 1 catheter was advanced to the aortic arch region and selectively positioned in the left common carotid artery. FINDINGS: The left common carotid arteriogram demonstrates the left external carotid artery and its major branches to be widely patent. The left internal carotid artery at the bulb to the cranial skull base also demonstrates wide patency with moderate tortuosity involving the distal 1/3. No evidence of stenosis or of kinking was seen. The petrous, cavernous and supraclinoid segments demonstrate wide patency. A dominant left posterior communicating artery is seen opacifying the left posterior cerebral artery distribution. The left anterior cerebral artery is seen to opacify into the capillary and venous phases. The left middle cerebral artery demonstrates angiographic occlusion of the M2 region  of the superior division of the left middle cerebral artery supplying a large area involving the parietal and the posterior frontal cortical subcortical areas. The delayed arterial phase demonstrates partial retrograde opacification of the distal distribution of this occluded vessel from the collaterals arising from the inferior division, and also the left pericallosal artery. PROCEDURE: The diagnostic JB 1 catheter in the left common carotid artery was then exchanged over a 0.035 inch 300 cm Rosen exchange guidewire for an 8 [place] 55 cm Brite tip neurovascular sheath using biplane roadmap technique and constant fluoroscopic guidance. Good aspiration obtained from the side port of the neurovascular sheath. This was then connected to continuous heparinized saline infusion. Over the 300 cm Rosen exchange guidewire, an 8 [place] 85 cm FlowGate balloon guide catheter which had been prepped with 50% contrast and 50% heparinized saline infusion was then positioned just proximal to the left common carotid bifurcation. The guidewire was removed. Good aspiration obtained from the hub of the 8 [place] FlowGate guide catheter. A gentle contrast injection demonstrated no evidence of spasms, dissections or of intraluminal filling defects. Over a 0.035 inch Roadrunner guidewire, using biplane roadmap technique and constant fluoroscopic guidance, this was then advanced to the middle 1/3 of the left internal carotid artery. Again the guidewire was removed. Good aspiration obtained from the hub of the [facility] guide catheter. A gentle contrast injection demonstrated no evidence of spasms, dissections or of intraluminal filling defects. No change was seen in the intracranial circulation. At this time, in a coaxial manner and with constant heparinized saline infusion using biplane roadmap technique and constant fluoroscopic guidance, a Trevo ProVue 021 microcatheter was then advanced over a 0.014 inch Softip Synchro micro guidewire to  the distal end of the FlowGate guide catheter. With the micro guidewire leading with a J-tip configuration, the combination was then navigated without difficulty to the supraclinoid left ICA. The wire was then advanced using a torque device into the left middle cerebral artery and super selectively advanced through the occluded M2 branch of the superior division M2 M3 region followed by the microcatheter. The guidewire was removed. Good aspiration obtained from the hub of the microcatheter. A gentle contrast injection demonstrated robust distal flow. A EmboTrap  5 mm x 33 mm retrieval device was then advanced in a coaxial manner with constant heparinized saline infusion using biplane roadmap technique and constant fluoroscopic guidance to the distal end of the microcatheter. The proximal and the distal landing zones were then defined. The O ring on the delivery microcatheter was then loosened. With slight forward gentle traction with the right hand on the delivery micro guidewire, with the left hand the delivery microcatheter was then retrieved deploying the retrieval device. Control arteriogram performed through the 8 [place] FlowGate guide catheter in the left internal carotid artery demonstrated a TICI 2b revascularization. The proximal portion of the retrieval device was then captured in the microcatheter. Proximal flow arrest was then initiated by inflating the balloon of the [facility] guide catheter in the left internal carotid artery. Thereafter using constant aspiration with a 60 mL syringe at the hub of the [facility] guide catheter, the combination of the retrieval device in the microcatheter were then gently retrieved and removed. Aspiration was continued as the balloon was then deflated. Brisk back bleed of blood was noted at the hub of the [facility] guide catheter. A control arteriogram performed through the FlowGate 8 [place] guide catheter demonstrated no significant change in the occluded M2 superior  division. This prompted a second pass using the above combination as described above. Again access was obtained distal to the occluded artery in the M2 superior division followed by the placement of the microcatheter. After having verified safe position of tip of the microcatheter, the EmboTrap 5 mm x 33 mm retrieval device was then deployed as described above. A control arteriogram performed after deployment revealed continued occlusion of the superior division M2 branch. However, with proximal flow arrest as described above, the combination of the retrieval device and the microcatheter were then retrieved. Aspiration with a 60 mL syringe at the hub of the [facility] guide catheter was continued as the balloon was then deflated. The aspirate did not contain any clots. The interstices of the retrieval device were also devoid of any clot debris. A third pass was then made at this time with the aid of an intermediate 5 French 115 cm Catalyst guide catheter inside of which was an [employer] microcatheter. This combination was then advanced over a 0.014 inch Softip Synchro micro guidewire to the supraclinoid left ICA. Access into the occluded M2 superior division of the right middle cerebral artery was then made with the micro guidewire followed by the microcatheter. The micro guidewire was then removed. Good aspiration was obtained at the hub of the microcatheter. Gentle contrast injection demonstrated good antegrade distal flow. The proximal and the distal landing zone of the retrieval device was then again defined. The O ring on the delivery microcatheter was loosened. With slight gentle traction with the right hand on the delivery micro guidewire, with the left hand the delivery microcatheter was retrieved unsheathing the retrieval device. A control arteriogram performed through the 5 French intermediary Catalyst guide catheter in the supraclinoid left ICA demonstrated a TICI 3 revascularization. The combination  of the 5 [place] Catalyst guide catheter with the microcatheter and the retrieval device were then retrieved and removed as constant aspiration was applied with a 60 mL syringe at the hub of the [facility] guide catheter in the left internal carotid artery, and also with a 25 mL syringe on the 5 [place] Catalyst guide catheter. The tip of the Catalyst guide catheter was now at the proximal portion of the retrieval device. The combination was then  retrieved and removed as constant aspiration was continued as flow arrest was reversed in the left internal carotid artery. A few specks of clot were seen in the aspirate. The retrieval device was devoid of any clots. After having established free back bleed at the hub of the [facility] guide catheter in the left internal carotid artery, a control arteriogram performed through the guide catheter demonstrated a TICI 3 reperfusion. No evidence of extravasation or of mass-effect on the major vessel was seen. The left posterior communicating artery and the left anterior cerebral artery continued to demonstrate wide patency in their entirety. The FlowGate guide catheter and the 8 French neurovascular sheath were then removed over a J-tip guidewire for an 8 [place] Pinnacle sheath. This was then removed with successful hemostasis in the right groin puncture site. The right groin appeared soft without evidence of bleeding or hematoma. Throughout the procedure, the patient's blood pressure and neurological status remained stable. The patient's distal pulses remained Dopplerable in the posterior tibials and dorsalis pedis unchanged from prior to the procedure. A Dyna CT of the brain performed on the table demonstrated no gross evidence of intracranial hemorrhages. The ventricles appeared symmetrical and unchanged. The patient was then transported to the PACU intubated prior to being sent to the neuro ICU. IMPRESSION: Status post endovascular complete revascularization of occluded prominent  superior division of the left middle cerebral artery using 3 passes with the EmboTrap 5 mm x 33 mm retrieval device achieving a TICI 3 revascularization. PLAN: Transferred to the PACU and neuro ICU for further management. Electronically Signed   By: Luanne Bras M.D.   On: 12/05/2017 09:28   Ct Head Code Stroke Wo Contrast  Result Date: 12/03/2017 CLINICAL DATA:  Code stroke. Acute onset of unresponsiveness while in the emergency department. Patient is known nonverbal. Acute onset of right-sided weakness. EXAM: CT HEAD WITHOUT CONTRAST TECHNIQUE: Contiguous axial images were obtained from the base of the skull through the vertex without intravenous contrast. COMPARISON:  None. FINDINGS: Brain: No acute infarct, hemorrhage, or mass lesion is present. 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 Hypodense infarct is present in the high right parietal lobe, likely also remote. A remote infarct of the left frontal operculum is present. Associated volume loss is noted. No acute left-sided infarct is present. No acute hemorrhage or mass lesion is present. Remote infarcts are present posteriorly within the left cerebellum. Brainstem is unremarkable. No significant extra-axial fluid collection is present. No significant extra-axial fluid collection is present. Vascular: Atherosclerotic calcifications are present within the internal carotid arteries bilaterally. There is no hyperdense vessel. Skull: Calvarium is intact. No focal lytic or blastic lesions are present. Sinuses/Orbits: The paranasal sinuses are clear. A remote right orbital blowout fracture is present. A right lens replacement is present. Globes and orbits are otherwise within normal limits. ASPECTS [facility] Stroke Program Early CT Score) - Ganglionic level infarction (caudate, lentiform nuclei, internal capsule, insula, M1-M3 cortex): 7/7 - Supraganglionic infarction (M4-M6  cortex): 3/3. Total score (0-10 with 10 being normal): 10/10. IMPRESSION: 1. Infarcts involving the high right parietal lobe and left frontal operculum appear remote. 2. Remote lacunar infarct of the left caudate head. 3. Remote infarcts of the posterior left cerebellum. 4. No acute infarct. 5. 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 6. ASPECTS is 10/10 These results were called by telephone at the time of interpretation on 12/03/2017 at 9:03 am  to Dr. Blanchie Dessert , who verbally acknowledged these results. Electronically Signed   By: San Morelle M.D.   On: 12/03/2017 09:08   Ir Thoracentesis Asp Pleural Space W/img Guide  Result Date: 12/09/2017 INDICATION: Patient with right pleural effusion. Request is made for diagnostic and therapeutic thoracentesis. EXAM: ULTRASOUND GUIDED DIAGNOSTIC AND THERAPEUTIC RIGHT THORACENTESIS MEDICATIONS: 10 mL 2% lidocaine COMPLICATIONS: None immediate. PROCEDURE: An ultrasound guided thoracentesis was thoroughly discussed with the patient and questions answered. The benefits, risks, alternatives and complications were also discussed. The patient understands and wishes to proceed with the procedure. Written consent was obtained. Ultrasound was performed to localize and mark an adequate pocket of fluid in the right chest. The area was then prepped and draped in the normal sterile fashion. 2% lidocaine was used for local anesthesia. Under ultrasound guidance a 6 Fr Safe-T-Centesis catheter was introduced. Thoracentesis was performed. The catheter was removed and a dressing applied. FINDINGS: A total of approximately 1.1 liters of amber fluid was removed. Samples were sent to the laboratory as requested by the clinical team. IMPRESSION: Successful ultrasound guided diagnostic and therapeutic right thoracentesis yielding 1.1 L of pleural fluid. Read by: Brynda Greathouse PA-C Electronically Signed   By: Jacqulynn Cadet  M.D.   On: 12/09/2017 17:36     Transthoracic Echocardiogram (5/4) Study Conclusions  - Left ventricle: The cavity size was below normal. Wall thickness   was increased in a pattern of mild LVH. Systolic function was   vigorous. The estimated ejection fraction was in the range of 65%   to 70%. Wall motion was normal; there were no regional wall   motion abnormalities. Doppler parameters are consistent with   abnormal left ventricular relaxation (grade 1 diastolic   dysfunction). Doppler parameters are consistent with high   ventricular filling pressure. - Ventricular septum: The contour showed diastolic flattening and   systolic flattening. These changes are consistent with RV volume   and pressure overload. - Aortic valve: Mildly calcified annulus. Trileaflet. - Mitral valve: Severely calcified annulus. Severely calcified   leaflets . The findings are consistent with mild stenosis. Mean   gradient (D): 4 mm Hg. Valve area by pressure half-time: 2.12   cm^2. Valve area by continuity equation (using LVOT flow): 0.81   cm^2. - Right ventricle: The cavity size was severely dilated. Pacer wire   or catheter noted in right ventricle. Systolic function was   reduced. - Right atrium: The atrium was severely dilated. Pacer wire or   catheter noted in right atrium. - Tricuspid valve: There was severe regurgitation. - Pulmonic valve: There was mild regurgitation. - Pulmonary arteries: Systolic pressure was severely increased. PA   peak pressure: 71 mm Hg (S). - Inferior vena cava: The CVP could not accurately be assessed as   the patient was ventilated (via IVC assessment). - Pericardium, extracardiac: A moderate to large size pericardial   effusion was noted. Features were not consistent with tamponade   physiology.   Subjective: No concerns today  Discharge Exam: Vitals:   12/14/17 0402 12/14/17 0737  BP: 112/62 119/60  Pulse: 79 78  Resp: 16 17  Temp: 97.6 F (36.4 C)     SpO2: 100% 98%   Vitals:   12/14/17 0015 12/14/17 0218 12/14/17 0402 12/14/17 0737  BP: 120/65  112/62 119/60  Pulse: 79  79 78  Resp: 20  16 17   Temp: 97.7 F (36.5 C)  97.6 F (36.4 C)   TempSrc: Oral  Oral  SpO2: 96%  100% 98%  Weight:  80.3 kg (177 lb 0.5 oz)    Height:        General: Pt is alert, awake, not in acute distress Cardiovascular: RRR, S1/S2 +, no rubs, no gallops Respiratory: CTA bilaterally, no wheezing, no rhonchi Abdominal: Soft, NT, ND, bowel sounds + Extremities: no edema, no cyanosis Neuro: alert and oriented to self and place.    The results of significant diagnostics from this hospitalization (including imaging, microbiology, ancillary and laboratory) are listed below for reference.     Microbiology: Recent Results (from the past 240 hour(s))  Body fluid culture     Status: None   Collection Time: 12/09/17  5:19 PM  Result Value Ref Range Status   Specimen Description PLEURAL  Final   Special Requests RIGHT  Final   Gram Stain   Final    RARE WBC PRESENT, PREDOMINANTLY PMN NO ORGANISMS SEEN    Culture   Final    NO GROWTH 3 DAYS Performed at [facility], [address] [place], [place] [address]    Report Status 12/13/2017 FINAL  Final     Labs: BNP (last 3 results) Recent Labs    12/03/17 0909 12/05/17 0451  BNP 381.5* 852.7*   Basic Metabolic Panel: Recent Labs  Lab 12/10/17 0401 12/11/17 0654 12/12/17 1050 12/13/17 0731 12/14/17 0729  NA 133* 134* 132* 136 136  K 5.7* 4.2 4.8 4.1 4.3  CL 101 103 100* 100* 100*  CO2 24 21* 23 25 25   GLUCOSE 130* 125* 171* 146* 137*  BUN 28* 25* 23* 23* 19  CREATININE 1.38* 1.31* 1.35* 1.35* 1.28*  CALCIUM 8.5* 8.7* 8.6* 8.5* 9.1   Liver Function Tests: Recent Labs  Lab 12/09/17 0450  PROT 6.9   No results for input(s): LIPASE, AMYLASE in the last 168 hours. No results for input(s): AMMONIA in the last 168 hours. CBC: Recent Labs  Lab 12/08/17 1258 12/09/17 0450  12/10/17 0636  WBC 8.3 7.9 7.9  HGB 9.2* 8.4* 8.4*  HCT 28.5* 25.6* 25.3*  MCV 69.9* 69.4* 69.3*  PLT 315 352 361   Cardiac Enzymes: No results for input(s): CKTOTAL, CKMB, CKMBINDEX, TROPONINI in the last 168 hours. BNP: Invalid input(s): POCBNP CBG: Recent Labs  Lab 12/13/17 1144 12/13/17 1709 12/13/17 2129 12/14/17 0630 12/14/17 1348  GLUCAP 188* 181* 188* 139* 164*   D-Dimer No results for input(s): DDIMER in the last 72 hours. Hgb A1c No results for input(s): HGBA1C in the last 72 hours. Lipid Profile No results for input(s): CHOL, HDL, LDLCALC, TRIG, CHOLHDL, LDLDIRECT in the last 72 hours. Thyroid function studies No results for input(s): TSH, T4TOTAL, T3FREE, THYROIDAB in the last 72 hours.  Invalid input(s): FREET3 Anemia work up No results for input(s): VITAMINB12, FOLATE, FERRITIN, TIBC, IRON, RETICCTPCT in the last 72 hours. Urinalysis    Component Value Date/Time   COLORURINE STRAW (A) 12/03/2017 1726   APPEARANCEUR CLEAR 12/03/2017 1726   LABSPEC 1.015 12/03/2017 1726   PHURINE 9.0 (H) 12/03/2017 1726   GLUCOSEU NEGATIVE 12/03/2017 1726   HGBUR NEGATIVE 12/03/2017 1726   BILIRUBINUR NEGATIVE 12/03/2017 1726   KETONESUR NEGATIVE 12/03/2017 1726   PROTEINUR NEGATIVE 12/03/2017 1726   NITRITE NEGATIVE 12/03/2017 1726   LEUKOCYTESUR NEGATIVE 12/03/2017 1726   Sepsis Labs Invalid input(s): PROCALCITONIN,  WBC,  LACTICIDVEN Microbiology Recent Results (from the past 240 hour(s))  Body fluid culture     Status: None   Collection Time: 12/09/17  5:19  PM  Result Value Ref Range Status   Specimen Description PLEURAL  Final   Special Requests RIGHT  Final   Gram Stain   Final    RARE WBC PRESENT, PREDOMINANTLY PMN NO ORGANISMS SEEN    Culture   Final    NO GROWTH 3 DAYS Performed at [facility] [address], [place], [place] [address]    Report Status 12/13/2017 FINAL  Final     SIGNED:   Cordelia Poche, MD [facility] 12/14/2017, 3:00 PM

## 2017-12-14 NOTE — Evaluation (Signed)
Occupational Therapy Evaluation Patient Details Name: Ana Gardner MRN: [id number] DOB: February 16, 1941 Today's Date: 12/14/2017    History of Present Illness 77 y.o. 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 No tPA given due to recent GI bleeding.  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   Clinical Impression   Pt making limited progress towards OT goals this session. Pt required max A for participation in OT. Improved overall happiness since this morning's session with PT - still displaying anxiety and impulsiveness during session. Discharge updated to SNF. OT will continue to follow acutely prior to dc.     Follow Up Recommendations  SNF    Equipment Recommendations  Other (comment)(defer to next venue)    Recommendations for Other Services       Precautions / Restrictions Precautions Precautions: Fall Precaution Comments: monitor O2 sats Restrictions Weight Bearing Restrictions: No      Mobility Bed Mobility Overal bed mobility: Needs Assistance Bed Mobility: Sit to Supine       Sit to supine: Supervision   General bed mobility comments: Supervision for all aspects of be mobility, cues for safety due to impulsiveness  Transfers Overall transfer level: Needs assistance Equipment used: None;1 person hand held assist Transfers: Sit to/from Stand;Stand Pivot Transfers Sit to Stand: Min assist Stand pivot transfers: Min assist       General transfer comment: Min assist to power up to standing, VCs for hand placement    Balance Overall balance assessment: Needs assistance Sitting-balance support: Feet supported Sitting  balance-Leahy Scale: Good     Standing balance support: During functional activity;Bilateral upper extremity supported Standing balance-Leahy Scale: Fair Standing balance comment: Patient able to static stand without UE support but can not perform dynamic activities without HHA or RW                           ADL either performed or assessed with clinical judgement   ADL Overall ADL's : Needs assistance/impaired                         Toilet Transfer: Minimal assistance;Stand-pivot;BSC Toilet Transfer Details (indicate cue type and reason): HHA to SPT from chair to bed Toileting- Clothing Manipulation and Hygiene: Minimal assistance;Sit to/from stand;Cueing for compensatory techniques;Cueing for safety         General ADL Comments: Pt required max A for participation in therapy     Vision         Perception     Praxis      Pertinent Vitals/Pain Pain Assessment: No/denies pain Pain Intervention(s): Monitored during session     Hand Dominance     Extremity/Trunk Assessment             Communication     Cognition Arousal/Alertness: Awake/alert Behavior During Therapy: Anxious;Impulsive Overall Cognitive Status: Impaired/Different from baseline Area of Impairment: Orientation;Attention;Memory;Safety/judgement;Awareness;Problem solving                 Orientation Level: Situation Current Attention Level: Focused Memory: Decreased short-term memory Following Commands: Follows one step commands consistently Safety/Judgement: Decreased awareness of safety;Decreased awareness of deficits Awareness: Intellectual Problem Solving: Slow processing;Decreased initiation;Difficulty sequencing;Requires  verbal cues General Comments: Pt less labile than this am due to discharge status   General Comments  anxious - but not labile this afternoon compared to this morning    Exercises     Shoulder Instructions      Home Living                                           Prior Functioning/Environment                   OT Problem List:        OT Treatment/Interventions:      OT Goals(Current goals can be found in the care plan section) Acute Rehab OT Goals Patient Stated Goal: to be independent via daughter Time For Goal Achievement: 12/19/17 Potential to Achieve Goals: Good  OT Frequency: Min 2X/week   Barriers to D/C:            Co-evaluation              AM-PAC PT "6 Clicks" Daily Activity     Outcome Measure Help from another person eating meals?: A Little Help from another person taking care of personal grooming?: A Little Help from another person toileting, which includes using toliet, bedpan, or urinal?: A Little Help from another person bathing (including washing, rinsing, drying)?: A Little Help from another person to put on and taking off regular upper body clothing?: A Little Help from another person to put on and taking off regular lower body clothing?: A Little 6 Click Score: 18   End of Session Equipment Utilized During Treatment: Oxygen  Activity Tolerance: Patient tolerated treatment well Patient left: with call bell/phone within reach;in bed;with bed alarm set  OT Visit Diagnosis: Unsteadiness on feet (R26.81);Cognitive communication deficit (R41.841);Hemiplegia and hemiparesis Symptoms and signs involving cognitive functions: Cerebral infarction Hemiplegia - Right/Left: Right Hemiplegia - dominant/non-dominant: Dominant Hemiplegia - caused by: Cerebral infarction                Time: 0177-9390 OT Time Calculation (min): 18 min Charges:  OT General Charges $OT Visit: 1 Visit OT Treatments $Self Care/Home Management : 8-22 mins G-Codes:     Hulda Humphrey OTR/L [place] 12/14/2017, 3:39 PM

## 2017-12-15 LAB — GLUCOSE, CAPILLARY
Glucose-Capillary: 131 mg/dL — ABNORMAL HIGH (ref 65–99)
Glucose-Capillary: 154 mg/dL — ABNORMAL HIGH (ref 65–99)

## 2017-12-15 NOTE — Care Management Important Message (Signed)
Important Message  Patient Details  Name: Ana Gardner MRN: [id number] Date of Birth: [birth year]   Medicare Important Message Given:  Yes    Orbie Pyo 12/15/2017, 1:46 PM

## 2017-12-15 NOTE — Progress Notes (Signed)
Patient seen and examined today. Discharge order placed yesterday in anticipation of discharge today. No change to discharge summary. Patient ready for discharge pending insurance authorization for SNF.  Cordelia Poche, MD [facility] 12/15/2017, 8:24 AM Pager: [phone]

## 2017-12-15 NOTE — Progress Notes (Signed)
  Speech Language Pathology Treatment: Cognitive-Linquistic  Patient Details Name: Ana Gardner MRN: [id number] DOB: [birth year] Today's Date: 12/15/2017 Time: 1100-1110 SLP Time Calculation (min) (ACUTE ONLY): 10 min  Assessment / Plan / Recommendation Clinical Impression  Brief treatment session focused on cognition goals. Pt continues to be emotional and support given as well as review of deficits and need for extended care. Pt appeared receptive in the moment with support. Pt able to voice understanding in the moment.   HPI        SLP Plan  Continue with current plan of care       Recommendations                   Follow up Recommendations: Skilled Nursing facility SLP Visit Diagnosis: Cognitive communication deficit (W09.811) Plan: Continue with current plan of care       [place] 12/15/2017, 12:03 PM

## 2017-12-15 NOTE — Progress Notes (Signed)
Pt being discharged from hospital per orders from MD. Pt educated on discharge instructions. Pt verbalized understanding of instructions. All questions and concerns were addressed. Pt's IV was removed prior to discharge. Pt exited hospital via stretcher with medical transport.

## 2017-12-15 NOTE — Care Management Note (Signed)
Case Management Note  Patient Details  Name: Ana Gardner MRN: [id number] Date of Birth: 01/28/1941  Subjective/Objective:                    Action/Plan: Pt discharging to [facility] SNF today. CM signing off.    Expected Discharge Date:  12/15/17               Expected Discharge Plan:  Skilled Nursing Facility  In-House Referral:  Clinical Social Work  Discharge planning Services  CM Consult  Post Acute Care Choice:    Choice offered to:     DME Arranged:    DME Agency:     HH Arranged:    [place] Agency:     Status of Service:  Completed, signed off  If discussed at [employer], dates discussed:    Additional Comments:  Pollie Friar, RN 12/15/2017, 1:55 PM

## 2017-12-15 NOTE — Clinical Social Work Placement (Signed)
Nurse to call report to [phone], Room [place]  NOTE  Date:  12/15/2017  Patient Details  Name: Ana Gardner MRN: [id number] Date of Birth: [birth year]  Clinical Social Work is seeking post-discharge placement for this patient at the [place] level of care (*CSW will initial, date and re-position this form in  chart as items are completed):  Yes   Patient/family provided with [facility]'s list of facilities offering this level of care within the geographic area requested by the patient (or if unable, by the patient's family).  Yes   Patient/family informed of their freedom to choose among providers that offer the needed level of care, that participate in Medicare, Medicaid or managed care program needed by the patient, have an available bed and are willing to accept the patient.  Yes   Patient/family informed of [facility]'s ownership interest in [facility] and [facility], as well as of the fact that they are under no obligation to receive care at these facilities.  PASRR submitted to EDS on 12/12/17     PASRR number received on 12/12/17     Existing PASRR number confirmed on       FL2 transmitted to all facilities in geographic area requested by pt/family on 12/12/17     FL2 transmitted to all facilities within larger geographic area on       Patient informed that his/her managed care company has contracts with or will negotiate with certain facilities, including the following:        Yes   Patient/family informed of bed offers received.  Patient chooses bed at [facility]     Physician recommends and patient chooses bed at      Patient to be transferred to [facility] on 12/15/17.  Patient to be transferred to facility by [facility]     Patient family notified on 12/15/17 of transfer.  Name of family member notified:  [name]     PHYSICIAN        Additional Comment:    _______________________________________________ Geralynn Ochs, [place] 12/15/2017, 1:53 PM

## 2017-12-16 ENCOUNTER — Encounter: Payer: Self-pay | Admitting: Internal Medicine

## 2017-12-16 ENCOUNTER — Non-Acute Institutional Stay (SKILLED_NURSING_FACILITY): Payer: Medicare HMO | Admitting: Internal Medicine

## 2017-12-16 DIAGNOSIS — D860 Sarcoidosis of lung: Secondary | ICD-10-CM

## 2017-12-16 DIAGNOSIS — E119 Type 2 diabetes mellitus without complications: Secondary | ICD-10-CM

## 2017-12-16 DIAGNOSIS — I272 Pulmonary hypertension, unspecified: Secondary | ICD-10-CM

## 2017-12-16 DIAGNOSIS — N179 Acute kidney failure, unspecified: Secondary | ICD-10-CM | POA: Diagnosis not present

## 2017-12-16 DIAGNOSIS — I50813 Acute on chronic right heart failure: Secondary | ICD-10-CM

## 2017-12-16 DIAGNOSIS — I639 Cerebral infarction, unspecified: Secondary | ICD-10-CM | POA: Diagnosis not present

## 2017-12-16 DIAGNOSIS — I482 Chronic atrial fibrillation, unspecified: Secondary | ICD-10-CM

## 2017-12-16 DIAGNOSIS — E871 Hypo-osmolality and hyponatremia: Secondary | ICD-10-CM

## 2017-12-16 DIAGNOSIS — I10 Essential (primary) hypertension: Secondary | ICD-10-CM

## 2017-12-16 DIAGNOSIS — K7031 Alcoholic cirrhosis of liver with ascites: Secondary | ICD-10-CM | POA: Diagnosis not present

## 2017-12-16 DIAGNOSIS — E1159 Type 2 diabetes mellitus with other circulatory complications: Secondary | ICD-10-CM

## 2017-12-16 DIAGNOSIS — I6389 Other cerebral infarction: Secondary | ICD-10-CM

## 2017-12-16 DIAGNOSIS — J9601 Acute respiratory failure with hypoxia: Secondary | ICD-10-CM

## 2017-12-16 DIAGNOSIS — J90 Pleural effusion, not elsewhere classified: Secondary | ICD-10-CM

## 2017-12-16 DIAGNOSIS — K219 Gastro-esophageal reflux disease without esophagitis: Secondary | ICD-10-CM

## 2017-12-16 DIAGNOSIS — I6602 Occlusion and stenosis of left middle cerebral artery: Secondary | ICD-10-CM | POA: Diagnosis not present

## 2017-12-17 ENCOUNTER — Encounter: Payer: Self-pay | Admitting: Internal Medicine

## 2017-12-17 DIAGNOSIS — N179 Acute kidney failure, unspecified: Secondary | ICD-10-CM | POA: Insufficient documentation

## 2017-12-17 DIAGNOSIS — I272 Pulmonary hypertension, unspecified: Secondary | ICD-10-CM | POA: Insufficient documentation

## 2017-12-17 DIAGNOSIS — E1159 Type 2 diabetes mellitus with other circulatory complications: Secondary | ICD-10-CM | POA: Insufficient documentation

## 2017-12-17 DIAGNOSIS — E871 Hypo-osmolality and hyponatremia: Secondary | ICD-10-CM | POA: Insufficient documentation

## 2017-12-17 DIAGNOSIS — E119 Type 2 diabetes mellitus without complications: Secondary | ICD-10-CM | POA: Insufficient documentation

## 2017-12-17 DIAGNOSIS — I10 Essential (primary) hypertension: Secondary | ICD-10-CM

## 2017-12-17 DIAGNOSIS — D860 Sarcoidosis of lung: Secondary | ICD-10-CM | POA: Insufficient documentation

## 2017-12-17 DIAGNOSIS — K219 Gastro-esophageal reflux disease without esophagitis: Secondary | ICD-10-CM | POA: Insufficient documentation

## 2017-12-17 NOTE — Progress Notes (Signed)
: Provider:  Hennie Duos MD Location:  [place] Room Number: 104-P Place of Service:  SNF [phone])  PCP: Karleen Hampshire., MD Patient Care Team: Karleen Hampshire., MD as PCP - General (Internal Medicine)  Extended Emergency Contact Information Primary Emergency Contact: Sparks,ernest Mobile Phone: [phone] Relation: Other Secondary Emergency Contact: Lafe Garin Home Phone: [phone] Mobile Phone: [phone] Relation: Friend     Allergies: Patient has no known allergies.  Chief Complaint  Patient presents with  . New Admit To SNF    New Admission Visit     HPI: Patient is 77 y.o. 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  Patient had ultrasound-guided thoracentesis and was started on diuretics.  Patient presented to sleep along the ED with complaints of shortness of breath and edema in her bilateral lower extremities over the past month and then in the ED patient became acutely a phasic with dense hemiplegia as well as a forced left gaze deviation.  Emergent stroke was consulted at that time patient had a NIHSS of 26.  Immediate CT noncontrast was done that showed infarct involving the right high parietal lobe and left frontal operculum.  Patient could not undergo CT angiogram due to respiratory status and she was unable to follow commands or protect her airway and was intubated  immediately patient was transferred to [facility] for further stroke management.  Patient underwent revascularization of the left MCA.  Patient also underwent ultrasound-guided thoracentesis on the right for pleural effusion.  Patient had prolonged hospital stay, her right hemiplegia slowly improved.  Patient is admitted to skilled nursing facility for OT/PT.  While at skilled nursing facility patient will be followed for chronic atrial fibrillation, rate controlled on metoprolol and amiodarone and prophylaxed on aspirin only, liver cirrhosis with ascites treated with Demadex GERD, treated with Protonix and diabetes mellitus type 2 which will be treated with Glucophage and the absence of medications for diabetes on discharge medication list.  Past Medical History:  Diagnosis Date  . Cancer (HCC)    B/L  . CHF (congestive heart failure) ([place])   . Diabetes mellitus without complication ([place])   . Sarcoidosis     Past Surgical History:  Procedure Laterality Date  . IR CT HEAD LTD  12/03/2017  . IR PERCUTANEOUS ART THROMBECTOMY/INFUSION INTRACRANIAL INC DIAG ANGIO  12/03/2017  . IR THORACENTESIS ASP PLEURAL SPACE W/IMG GUIDE  12/09/2017  . MASTECTOMY Left 2004  . PACEMAKER IMPLANT    . RADIOLOGY WITH ANESTHESIA N/A 12/03/2017   Procedure: CODE STROKE;  Surgeon: Luanne Bras, MD;  Location: [place];  Service: Radiology;  Laterality: N/A;    Allergies as of 12/16/2017   No Known Allergies     Medication List        Accurate as of 12/16/17 11:59 PM. Always use your most recent med list.          amiodarone 200 MG tablet Commonly known as:  PACERONE Take 200 mg by mouth daily.   aspirin 81 MG EC tablet Take 1  tablet (81 mg total) by mouth daily.   metolazone 2.5 MG tablet Commonly known as:  ZAROXOLYN Take 2.5 mg by mouth. Twice weekly   metoprolol succinate 25 MG 24 hr tablet Commonly known as:  TOPROL-XL Take 25 mg by mouth daily.   pantoprazole 40 MG tablet Commonly  known as:  PROTONIX Take 40 mg by mouth daily.   torsemide 20 MG tablet Commonly known as:  DEMADEX Take 20 mg by mouth daily.       No orders of the defined types were placed in this encounter.   Immunization History  Administered Date(s) Administered  . Influenza, High Dose Seasonal PF 07/17/2015, 05/12/2017  . Influenza,inj,Quad PF,6+ Mos 09/08/2016  . Influenza-Unspecified 09/08/2016  . Pneumococcal Conjugate-13 05/12/2017    Social History   Tobacco Use  . Smoking status: Never Smoker  . Smokeless tobacco: Never Used  Substance Use Topics  . Alcohol use: Never    Frequency: Never    Family history is daughter with lupus, father with cancer  History reviewed. No pertinent family history.    Review of Systems  DATA OBTAINED: from patient, nurse GENERAL:  no fevers, fatigue, appetite changes SKIN: No itching, or rash EYES: No eye pain, redness, discharge EARS: No earache, tinnitus, change in hearing NOSE: No congestion, drainage or bleeding  MOUTH/THROAT: No mouth or tooth pain, No sore throat RESPIRATORY: No cough, wheezing, SOB CARDIAC: No chest pain, palpitations, lower extremity edema  GI: No abdominal pain, No N/V/D or constipation, No heartburn or reflux  GU: No dysuria, frequency or urgency, or incontinence  MUSCULOSKELETAL: No unrelieved bone/joint pain NEUROLOGIC: No headache, dizziness or focal weakness PSYCHIATRIC: No c/o anxiety or sadness   Vitals:   12/16/17 1007  BP: 120/78  Pulse: 78  Resp: 20  Temp: 97.9 F (36.6 C)  SpO2: 94%    SpO2 Readings from Last 1 Encounters:  12/16/17 94%   Body mass index is 28.31 kg/m.     Physical Exam  GENERAL APPEARANCE: Alert, conversant,  No acute distress.  SKIN: No diaphoresis rash HEAD: Normocephalic, atraumatic  EYES: Conjunctiva/lids clear. Pupils round, reactive. EOMs intact.  EARS: External exam WNL, canals clear. Hearing grossly normal.  NOSE: No deformity or discharge.    MOUTH/THROAT: Lips w/o lesions  RESPIRATORY: Breathing is even, unlabored. Lung sounds are slt rales right base CARDIOVASCULAR: Heart regular no murmurs, rubs or gallops. No peripheral edema.   GASTROINTESTINAL: Abdomen is soft, non-tender, not distended w/ normal bowel sounds. GENITOURINARY: Bladder non tender, not distended  MUSCULOSKELETAL: No abnormal joints or musculature NEUROLOGIC:  Cranial nerves 2-12 grossly intact. Moves all extremities  PSYCHIATRIC: Mood and affect appropriate to situation, no behavioral issues  Patient Active Problem List   Diagnosis Date Noted  . Acute hemorrhagic infarction of brain ([place])   . Anemia   . Elevated INR   . Alcoholic cirrhosis of liver with ascites ([place])   . Paroxysmal atrial fibrillation (HCC)   . Stroke ([place]) 12/03/2017  . Acute CVA (cerebrovascular accident) ([place]) 12/03/2017  . Stroke (cerebrum) ([place]) 12/03/2017  . Middle cerebral artery embolism, left 12/03/2017  . Acute on chronic congestive heart failure ([place])   . Acute respiratory failure with hypoxia ([place])   . Recurrent right pleural effusion       Labs reviewed: Basic Metabolic Panel:    Component Value Date/Time   NA 136 12/14/2017 0729   K 4.3 12/14/2017 0729   CL 100 (L) 12/14/2017 0729   CO2 25 12/14/2017 0729  GLUCOSE 137 (H) 12/14/2017 0729   BUN 19 12/14/2017 0729   CREATININE 1.28 (H) 12/14/2017 0729   CALCIUM 9.1 12/14/2017 0729   PROT 6.9 12/09/2017 0450   ALBUMIN 2.8 (L) 12/03/2017 0909   AST 19 12/03/2017 0909   ALT 10 (L) 12/03/2017 0909   ALKPHOS 48 12/03/2017 0909   BILITOT 1.0 12/03/2017 0909   GFRNONAA 40 (L) 12/14/2017 0729   GFRAA 46 (L) 12/14/2017 0729    Recent Labs    12/05/17 1854 12/06/17 0431 12/07/17 0334  12/12/17 1050 12/13/17 0731 12/14/17 0729  NA  --  136 139   < > 132* 136 136  K  --  4.2 4.5   < > 4.8 4.1 4.3  CL  --  103 106   < > 100* 100* 100*  CO2  --  24 26   < > 23 25 25   GLUCOSE  --  225* 169*   < > 171* 146*  137*  BUN  --  24* 25*   < > 23* 23* 19  CREATININE  --  1.32* 1.36*   < > 1.35* 1.35* 1.28*  CALCIUM  --  8.4* 8.7*   < > 8.6* 8.5* 9.1  MG 1.8 2.0 1.9  --   --   --   --   PHOS 3.5 3.7 3.6  --   --   --   --    < > = values in this interval not displayed.   Liver Function Tests: Recent Labs    12/03/17 0909 12/09/17 0450  AST 19  --   ALT 10*  --   ALKPHOS 48  --   BILITOT 1.0  --   PROT 5.8* 6.9  ALBUMIN 2.8*  --    No results for input(s): LIPASE, AMYLASE in the last 8760 hours. No results for input(s): AMMONIA in the last 8760 hours. CBC: Recent Labs    12/03/17 0909  12/04/17 0417  12/08/17 1258 12/09/17 0450 12/10/17 0636  WBC 7.1  --  19.0*   < > 8.3 7.9 7.9  NEUTROABS 4.5  --  15.9*  --   --   --   --   HGB 10.1*   < > 9.1*   < > 9.2* 8.4* 8.4*  HCT 28.6*   < > 28.1*   < > 28.5* 25.6* 25.3*  MCV 68.1*  --  69.7*   < > 69.9* 69.4* 69.3*  PLT 432*  --  408*   < > 315 352 361   < > = values in this interval not displayed.   Lipid Recent Labs    12/03/17 1630 12/04/17 0417  CHOL  --  70  HDL  --  18*  LDLCALC  --  38  TRIG 63 72    Cardiac Enzymes: Recent Labs    12/03/17 0909  TROPONINI <0.03   BNP: Recent Labs    12/03/17 0909 12/05/17 0451  BNP 381.5* 701.0*   No results found for: [facility] Lab Results  Component Value Date   HGBA1C 8.6 (H) 12/04/2017   Lab Results  Component Value Date   TSH 2.567 12/05/2017   No results found for: VITAMINB12 No results found for: FOLATE No results found for: IRON, TIBC, FERRITIN  Imaging and Procedures obtained prior to SNF admission: Ct Angio Head W Or Wo Contrast  Result Date: 12/03/2017 CLINICAL DATA:  Code stroke.  Acute onset of right-sided weakness. EXAM: CT ANGIOGRAPHY HEAD AND NECK TECHNIQUE:  Multidetector CT imaging of the head and neck was performed using the standard protocol during bolus administration of intravenous contrast. Multiplanar CT image reconstructions and MIPs were  obtained to evaluate the vascular anatomy. Carotid stenosis measurements (when applicable) are obtained utilizing NASCET criteria, using the distal internal carotid diameter as the denominator. CONTRAST:  62mL ISOVUE-370 IOPAMIDOL (ISOVUE-370) INJECTION 76% COMPARISON:  CT head without contrast from the same day at 8:46 a.m. at [facility], [facility]. FINDINGS: CT HEAD FINDINGS Brain: Repeat noncontrast CT of the head again demonstrates remote infarcts of the left frontal operculum, left caudate head, and high right parietal lobe. Remote posterior left cerebellar infarcts are again seen. Anterior left insular hypoattenuation may be slightly worse than on the prior exam. The precentral gyrus is intact. Vascular: Atherosclerotic calcifications are again noted within the cavernous internal carotid arteries bilaterally. There is no hyperdense vessel. Skull: Calvarium is intact. No focal lytic or blastic lesions are present. Sinuses: Paranasal sinuses and mastoid air cells are clear. Orbits: Remote blowout fracture is again noted in the right orbit. Right lens replacement is present. Globes and orbits are otherwise within normal limits. Review of the MIP images confirms the above findings CTA NECK FINDINGS Aortic arch: A 3 vessel arch configuration is present. Atherosclerotic calcifications are present at the origins of the great vessels without a significant stenosis. There is no aneurysm. Right carotid system: The right common carotid artery is within normal limits. Atherosclerotic changes are noted in the proximal right ICA. More distal right internal carotid artery is within normal limits. Left carotid system: The left common carotid artery is within normal limits. Atherosclerotic calcifications are present at the carotid bifurcation proximal left ICA without significant stenosis. There is mild tortuosity of the more distal left ICA without significant stenosis. Vertebral arteries: Atherosclerotic  calcifications are present at the origin of the right vertebral artery without significant stenosis. The left vertebral artery is occluded at its origin. There is very faint reconstitution at the C2-3 level without intraluminal contrast at the foramen magnum. Skeleton: Rightward curvature is present in the lower cervical spine, compensating for leftward curvature in the upper thoracic spine. Other neck: Heterogeneous thyroid is present without a dominant lesion. Patient is intubated. No significant cervical adenopathy is present. Salivary glands are within normal limits. Upper chest: A large right pleural effusion is again seen. The moderate left-sided pleural effusion is present. There is partial collapse of the right upper lobe. Possible soft tissue fullness is present at the right hila. Recommend dedicated CT of the chest with contrast as tolerated. Review of the MIP images confirms the above findings CTA HEAD FINDINGS Anterior circulation: Atherosclerotic calcifications are present within the cavernous internal carotid arteries bilaterally. There is no significant luminal stenosis relative to the more distal vessels through the ICA terminus. The M1 segments are intact bilaterally. No significant proximal occlusion is present on the right. There is moderate attenuation of superior and posterior M2 divisions. The left M1 segment is intact. Proximal superior left M2 and M3 segment is occluded or highly stenotic. The posterior left M2 division is not visualized. Posterior circulation: The right vertebral artery feeds the basilar. PICA origin is visualized. The basilar artery is small centrally terminating at the superior cerebellar arteries. Fetal type posterior cerebral arteries are present bilaterally with moderate distal segmental stenoses but no significant proximal stenosis or occlusion. Venous sinuses: Dural sinuses are patent. Straight sinus and deep cerebral veins are intact. Cortical veins are unremarkable.  Anatomic variants: Fetal  type posterior cerebral arteries bilaterally. Delayed phase: Postcontrast images are not performed in the acute setting. Review of the MIP images confirms the above findings IMPRESSION: 1. Proximal posterior division left M2 occlusion. 2. More distal anterior division left M2/M3 occlusion. 3. Extensive intracranial small vessel disease. 4. Occluded left vertebral artery with minimal reconstitution in the neck. 5. Fetal type posterior cerebral arteries bilaterally. 6. Atherosclerotic changes at the carotid bifurcations and cavernous internal carotid arteries bilaterally without other significant stenosis. 7. Aortic Atherosclerosis (ICD10-I70.0). Calcifications involve the great vessel origins without significant stenoses. 8. Large bilateral pleural effusions, right greater than left. 9. Parenchymal volume loss in the right upper lobe likely related to the effusions. Central mass lesion not excluded. 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 Recommend dedicated CT the chest with contrast as the patient's condition allows. 10. These results were called by telephone at the time of interpretation on 12/03/2017 at 11:02 am to Dr. Roland Rack , who verbally acknowledged these results. Electronically Signed   By: San Morelle M.D.   On: 12/03/2017 11:05   Ct Head Wo Contrast  Result Date: 12/04/2017 CLINICAL DATA:  Follow-up exam for acute stroke, status post catheter directed revascularization. EXAM: CT HEAD WITHOUT CONTRAST TECHNIQUE: Contiguous axial images were obtained from the base of the skull through the vertex without intravenous contrast. COMPARISON:  Prior CT from earlier the same day. FINDINGS: Brain: Previously noted hyperdensity at the level of the left insula again seen, measuring 10 x 12 x 14 mm (transverse by AP by craniocaudad). Overall, this is similar in size with slightly decreased attenuation as compared to previous. Given persistence, hemorrhage  is favored, although a degree of contrast staining may be contributory. Evolving acute left MCA territory infarcts within the adjacent all left frontal operculum and posterior frontal region again seen, slightly more apparent on current exam. No mass effect. No other acute intracranial hemorrhage. No other acute large vessel territory infarct. No mass lesion or midline shift. No hydrocephalus. No extra-axial fluid collection. Underlying atrophy with chronic small vessel ischemic changes again noted. Vascular: No new hyperdense vessel. Scattered vascular calcifications noted within the carotid siphons. Skull: No acute scalp soft tissue abnormality.  Calvarium intact. Sinuses/Orbits: Globes and orbital soft tissues within normal limits. Small air-fluid levels noted within the sphenoid sinuses. Scattered mucosal thickening throughout the ethmoidal air cells and maxillary sinuses. Trace right mastoid effusion. Other: None. IMPRESSION: 1. Persistent hyperdensity at the left insula, similar in size measuring up to 14 mm with slightly decreased attenuation. Hemorrhage is favored. 2. Additional multifocal areas of evolving left MCA territory infarction, stable in distribution as compared to previous. 3. No other new acute intracranial abnormality. Electronically Signed   By: Jeannine Boga M.D.   On: 12/04/2017 14:29   Ct Head Wo Contrast  Result Date: 12/04/2017 CLINICAL DATA:  77 y/o F; stroke post catheter thrombectomy for follow-up. EXAM: CT HEAD WITHOUT CONTRAST TECHNIQUE: Contiguous axial images were obtained from the base of the skull through the vertex without intravenous contrast. COMPARISON:  11/04/2017 CT head, CTA head, angiogram head. FINDINGS: Brain: Amorphous density within the left anterior insula measuring 10 x 9 x 16 mm (AP x ML x CC series 6, image 17 and series 5, image 46). Hypoattenuation within the left lateral frontal lobe, frontal operculum, and insula. Small chronic infarcts in the  posterior left cerebellum, left caudate head, right parietal lobe, and right frontal operculum. No hydrocephalus, extra-axial collection, or effacement of basilar cisterns. Vascular: Calcific  atherosclerosis of carotid siphons. Skull: Normal. Negative for fracture or focal lesion. Sinuses/Orbits: Chronic right lamina papyracea fracture. Partial opacification of paranasal sinuses likely due to recent intubation. Other: None. IMPRESSION: 1. Amorphous density in left anterior insula measuring up to 16 mm, probably enhancing infarction, although hemorrhage is possible. Follow-up is recommended to ensure stability. 2. Additional area of hypoattenuation within left lateral frontal lobe, frontal operculum, insula compatible with evolving infarction. 3. Multiple additional stable chronic infarcts as above. These results were called by telephone at the time of interpretation on 12/04/2017 at 5:31 am to Dr. Rory Percy, who verbally acknowledged these results. Electronically Signed   By: Kristine Garbe M.D.   On: 12/04/2017 05:33   Ct Angio Neck W Or Wo Contrast  Result Date: 12/03/2017 CLINICAL DATA:  Code stroke.  Acute onset of right-sided weakness. EXAM: CT ANGIOGRAPHY HEAD AND NECK TECHNIQUE: Multidetector CT imaging of the head and neck was performed using the standard protocol during bolus administration of intravenous contrast. Multiplanar CT image reconstructions and MIPs were obtained to evaluate the vascular anatomy. Carotid stenosis measurements (when applicable) are obtained utilizing NASCET criteria, using the distal internal carotid diameter as the denominator. CONTRAST:  95mL ISOVUE-370 IOPAMIDOL (ISOVUE-370) INJECTION 76% COMPARISON:  CT head without contrast from the same day at 8:46 a.m. at [facility]. FINDINGS: CT HEAD FINDINGS Brain: Repeat noncontrast CT of the head again demonstrates remote infarcts of the left frontal operculum, left caudate head, and high right parietal  lobe. Remote posterior left cerebellar infarcts are again seen. Anterior left insular hypoattenuation may be slightly worse than on the prior exam. The precentral gyrus is intact. Vascular: Atherosclerotic calcifications are again noted within the cavernous internal carotid arteries bilaterally. There is no hyperdense vessel. Skull: Calvarium is intact. No focal lytic or blastic lesions are present. Sinuses: Paranasal sinuses and mastoid air cells are clear. Orbits: Remote blowout fracture is again noted in the right orbit. Right lens replacement is present. Globes and orbits are otherwise within normal limits. Review of the MIP images confirms the above findings CTA NECK FINDINGS Aortic arch: A 3 vessel arch configuration is present. Atherosclerotic calcifications are present at the origins of the great vessels without a significant stenosis. There is no aneurysm. Right carotid system: The right common carotid artery is within normal limits. Atherosclerotic changes are noted in the proximal right ICA. More distal right internal carotid artery is within normal limits. Left carotid system: The left common carotid artery is within normal limits. Atherosclerotic calcifications are present at the carotid bifurcation proximal left ICA without significant stenosis. There is mild tortuosity of the more distal left ICA without significant stenosis. Vertebral arteries: Atherosclerotic calcifications are present at the origin of the right vertebral artery without significant stenosis. The left vertebral artery is occluded at its origin. There is very faint reconstitution at the C2-3 level without intraluminal contrast at the foramen magnum. Skeleton: Rightward curvature is present in the lower cervical spine, compensating for leftward curvature in the upper thoracic spine. Other neck: Heterogeneous thyroid is present without a dominant lesion. Patient is intubated. No significant cervical adenopathy is present. Salivary  glands are within normal limits. Upper chest: A large right pleural effusion is again seen. The moderate left-sided pleural effusion is present. There is partial collapse of the right upper lobe. Possible soft tissue fullness is present at the right hila. Recommend dedicated CT of the chest with contrast as tolerated. Review of the MIP images confirms the above findings  CTA HEAD FINDINGS Anterior circulation: Atherosclerotic calcifications are present within the cavernous internal carotid arteries bilaterally. There is no significant luminal stenosis relative to the more distal vessels through the ICA terminus. The M1 segments are intact bilaterally. No significant proximal occlusion is present on the right. There is moderate attenuation of superior and posterior M2 divisions. The left M1 segment is intact. Proximal superior left M2 and M3 segment is occluded or highly stenotic. The posterior left M2 division is not visualized. Posterior circulation: The right vertebral artery feeds the basilar. PICA origin is visualized. The basilar artery is small centrally terminating at the superior cerebellar arteries. Fetal type posterior cerebral arteries are present bilaterally with moderate distal segmental stenoses but no significant proximal stenosis or occlusion. Venous sinuses: Dural sinuses are patent. Straight sinus and deep cerebral veins are intact. Cortical veins are unremarkable. Anatomic variants: Fetal type posterior cerebral arteries bilaterally. Delayed phase: Postcontrast images are not performed in the acute setting. Review of the MIP images confirms the above findings IMPRESSION: 1. Proximal posterior division left M2 occlusion. 2. More distal anterior division left M2/M3 occlusion. 3. Extensive intracranial small vessel disease. 4. Occluded left vertebral artery with minimal reconstitution in the neck. 5. Fetal type posterior cerebral arteries bilaterally. 6. Atherosclerotic changes at the carotid  bifurcations and cavernous internal carotid arteries bilaterally without other significant stenosis. 7. Aortic Atherosclerosis (ICD10-I70.0). Calcifications involve the great vessel origins without significant stenoses. 8. Large bilateral pleural effusions, right greater than left. 9. Parenchymal volume loss in the right upper lobe likely related to the effusions. Central mass lesion not excluded. 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 Recommend dedicated CT the chest with contrast as the patient's condition allows. 10. These results were called by telephone at the time of interpretation on 12/03/2017 at 11:02 am to Dr. Roland Rack , who verbally acknowledged these results. Electronically Signed   By: San Morelle M.D.   On: 12/03/2017 11:05   Ct Chest Wo Contrast  Addendum Date: 12/04/2017   ADDENDUM REPORT: 12/04/2017 17:04 ADDENDUM: The upper abdominal section of the original report was inadvertently not included and is dictated below. UPPER ABDOMEN: Liver margin appears irregular. Decreased attenuation in the dome is likely due to dilated hepatic veins. Stones are seen in the gallbladder. Probable adrenal thickening. Visualized portions of the kidneys, spleen, pancreas and stomach are grossly unremarkable. Upper abdominal lymph nodes do not appear enlarged by CT size criteria. Ascites. IMPRESSION: 8. Marginal irregularity of the liver is indicative of cirrhosis. 9.       Cholelithiasis. 10.     Ascites. Electronically Signed   By: Lorin Picket M.D.   On: 12/04/2017 17:04   Result Date: 12/04/2017 CLINICAL DATA:  Pleural effusions. EXAM: CT CHEST WITHOUT CONTRAST TECHNIQUE: Multidetector CT imaging of the chest was performed following the standard protocol without IV contrast. COMPARISON:  Chest radiograph 12/03/2017 and CT chest 01/01/2017. FINDINGS: Cardiovascular: Right IJ central line terminates in the SVC. Atherosclerotic calcification of the arterial vasculature,  including coronary arteries. Pulmonary arteries are enlarged. Heart is moderately enlarged with a moderate pericardial effusion. Mediastinum/Nodes: Low internal jugular and mediastinal adenopathy measures up to 1.1 cm in the right paratracheal station. There are calcified mediastinal and bihilar lymph nodes. Distal periesophageal lymph node measures 11 mm, as before. No axillary adenopathy. Lungs/Pleura: Image quality is degraded by respiratory motion. Large right pleural effusion and adjacent collapse/consolidation obscure the majority of the right hemithorax. Nodular areas in the apex of the left upper  lobe are grossly stable. Scattered areas of peribronchovascular ground-glass in the left upper lobe, possibly infectious or inflammatory in etiology. Moderate left pleural effusion with collapse/consolidation in the left lower lobe. Endotracheal tube terminates at the orifice of the left mainstem bronchus. Musculoskeletal: Degenerative changes in the spine. No worrisome lytic or sclerotic lesions. IMPRESSION: 1. Endotracheal tube is low lying, at the orifice of the left mainstem bronchus. Pulling back 3-4 cm would likely better position the tip above the carina. 2. Large right pleural effusion with collapse/consolidation in the adjacent right lung. 3. Moderate left pleural effusion with collapse/consolidation in the left lower lobe. 4. Scattered areas of nodularity and ground-glass in the left upper lobe, poorly evaluated due to respiratory motion. 5. Moderate pericardial effusion. 6. Aortic atherosclerosis (ICD10-170.0). Coronary artery calcification. 7. Enlarged pulmonary arteries, indicative of pulmonary arterial hypertension. Electronically Signed: By: Lorin Picket M.D. On: 12/04/2017 16:47   [place]  Result Date: 12/06/2017 INDICATION: Global aphasia.  Right-sided hemiplegia, left gaze deviation. EXAM: 1. EMERGENT LARGE VESSEL OCCLUSION THROMBOLYSIS (anterior CIRCULATION) COMPARISON:  CT angiogram  of the head and neck of 12/03/2017. MEDICATIONS: Ancef 2 g IV antibiotic was administered within 1 hour of the procedure. ANESTHESIA/SEDATION: General anesthesia. CONTRAST:  Isovue 300 approximately 90 cc. FLUOROSCOPY TIME:  Fluoroscopy Time: 40 minutes 12 seconds (2313 mGy). COMPLICATIONS: None immediate. TECHNIQUE: Emergency consent was obtained by 2 physicians involved in the care of this patient. No family members were available or could be reached on the telephone numbers provided. The patient was then put under general anesthesia by the Department of Anesthesiology at [facility]. The right groin was prepped and draped in the usual sterile fashion. Thereafter using modified Seldinger technique, transfemoral access into the right common femoral artery was obtained without difficulty. Over a 0.035 inch guidewire a 5 French Pinnacle sheath was inserted. Through this, and also over a 0.035 inch guidewire a 5 [place] JB 1 catheter was advanced to the aortic arch region and selectively positioned in the left common carotid artery. FINDINGS: The left common carotid arteriogram demonstrates the left external carotid artery and its major branches to be widely patent. The left internal carotid artery at the bulb to the cranial skull base also demonstrates wide patency with moderate tortuosity involving the distal 1/3. No evidence of stenosis or of kinking was seen. The petrous, cavernous and supraclinoid segments demonstrate wide patency. A dominant left posterior communicating artery is seen opacifying the left posterior cerebral artery distribution. The left anterior cerebral artery is seen to opacify into the capillary and venous phases. 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 The delayed arterial phase  demonstrates partial retrograde opacification of the distal distribution of this occluded vessel from the collaterals arising from the inferior division, and also the left pericallosal artery. PROCEDURE: The diagnostic JB 1 catheter in the left common carotid artery was then exchanged over a 0.035 inch 300 cm Rosen exchange guidewire for an 8 French 55 cm Brite tip neurovascular sheath using biplane roadmap technique and constant fluoroscopic guidance. Good aspiration obtained from the side port of the neurovascular sheath. This was then connected to continuous heparinized saline infusion. Over the 300 cm Constance Holster exchange guidewire, an 8 [place] 85 cm FlowGate balloon guide catheter which had been prepped with 50% contrast and 50% heparinized saline infusion was  then positioned just proximal to the left common carotid bifurcation. The guidewire was removed. Good aspiration obtained from the hub of the 8 [place] FlowGate guide catheter. A gentle contrast injection demonstrated no evidence of spasms, dissections or of intraluminal filling defects. Over a 0.035 inch Roadrunner guidewire, using biplane roadmap technique and constant fluoroscopic guidance, this was then advanced to the middle 1/3 of the left internal carotid artery. Again the guidewire was removed. Good aspiration obtained from the hub of the [facility] guide catheter. A gentle contrast injection demonstrated no evidence of spasms, dissections or of intraluminal filling defects. No change was seen in the intracranial circulation. At this time, in a coaxial manner and with constant heparinized saline infusion using biplane roadmap technique and constant fluoroscopic guidance, a Trevo ProVue 021 microcatheter was then advanced over a 0.014 inch Softip Synchro micro guidewire to the distal end of the FlowGate guide catheter. With the micro guidewire leading with a J-tip configuration, the combination was then navigated without difficulty to the supraclinoid left  ICA. The wire was then advanced using a torque device into the left middle cerebral artery and super selectively advanced through the occluded M2 branch of the superior division M2 M3 region followed by the microcatheter. The guidewire was removed. Good aspiration obtained from the hub of the microcatheter. A gentle contrast injection demonstrated robust distal flow. A EmboTrap 5 mm x 33 mm retrieval device was then advanced in a coaxial manner with constant heparinized saline infusion using biplane roadmap technique and constant fluoroscopic guidance to the distal end of the microcatheter. The proximal and the distal landing zones were then defined. The O ring on the delivery microcatheter was then loosened. With slight forward gentle traction with the right hand on the delivery micro guidewire, with the left hand the delivery microcatheter was then retrieved deploying the retrieval device. Control arteriogram performed through the 8 [place] FlowGate guide catheter in the left internal carotid artery demonstrated a TICI 2b revascularization. The proximal portion of the retrieval device was then captured in the microcatheter. Proximal flow arrest was then initiated by inflating the balloon of the [facility] guide catheter in the left internal carotid artery. Thereafter using constant aspiration with a 60 mL syringe at the hub of the [facility] guide catheter, the combination of the retrieval device in the microcatheter were then gently retrieved and removed. Aspiration was continued as the balloon was then deflated. Brisk back bleed of blood was noted at the hub of the [facility] guide catheter. A control arteriogram performed through the FlowGate 8 [place] guide catheter demonstrated no significant change in the occluded M2 superior division. This prompted a second pass using the above combination as described above. Again access was obtained distal to the occluded artery in the M2 superior division followed by the  placement of the microcatheter. After having verified safe position of tip of the microcatheter, the EmboTrap 5 mm x 33 mm retrieval device was then deployed as described above. A control arteriogram performed after deployment revealed continued occlusion of the superior division M2 branch. However, with proximal flow arrest as described above, the combination of the retrieval device and the microcatheter were then retrieved. Aspiration with a 60 mL syringe at the hub of the [facility] guide catheter was continued as the balloon was then deflated. The aspirate did not contain any clots. The interstices of the retrieval device were also devoid of any clot debris. A third pass was then made at this time with the aid of an intermediate  5 French 115 cm Catalyst guide catheter inside of which was an [employer] microcatheter. This combination was then advanced over a 0.014 inch Softip Synchro micro guidewire to the supraclinoid left ICA. Access into the occluded M2 superior division of the right middle cerebral artery was then made with the micro guidewire followed by the microcatheter. The micro guidewire was then removed. Good aspiration was obtained at the hub of the microcatheter. Gentle contrast injection demonstrated good antegrade distal flow. The proximal and the distal landing zone of the retrieval device was then again defined. The O ring on the delivery microcatheter was loosened. With slight gentle traction with the right hand on the delivery micro guidewire, with the left hand the delivery microcatheter was retrieved unsheathing the retrieval device. A control arteriogram performed through the 5 French intermediary Catalyst guide catheter in the supraclinoid left ICA demonstrated a TICI 3 revascularization. The combination of the 5 [place] Catalyst guide catheter with the microcatheter and the retrieval device were then retrieved and removed as constant aspiration was applied with a 60 mL syringe at the  hub of the [facility] guide catheter in the left internal carotid artery, and also with a 25 mL syringe on the 5 [place] Catalyst guide catheter. The tip of the Catalyst guide catheter was now at the proximal portion of the retrieval device. The combination was then retrieved and removed as constant aspiration was continued as flow arrest was reversed in the left internal carotid artery. A few specks of clot were seen in the aspirate. The retrieval device was devoid of any clots. After having established free back bleed at the hub of the [facility] guide catheter in the left internal carotid artery, a control arteriogram performed through the guide catheter demonstrated a TICI 3 reperfusion. No evidence of extravasation or of mass-effect on the major vessel was seen. The left posterior communicating artery and the left anterior cerebral artery continued to demonstrate wide patency in their entirety. The FlowGate guide catheter and the 8 French neurovascular sheath were then removed over a J-tip guidewire for an 8 [place] Pinnacle sheath. This was then removed with successful hemostasis in the right groin puncture site. The right groin appeared soft without evidence of bleeding or hematoma. Throughout the procedure, the patient's blood pressure and neurological status remained stable. The patient's distal pulses remained Dopplerable in the posterior tibials and dorsalis pedis unchanged from prior to the procedure. A Dyna CT of the brain performed on the table demonstrated no gross evidence of intracranial hemorrhages. The ventricles appeared symmetrical and unchanged. The patient was then transported to the PACU intubated prior to being sent to the neuro ICU. IMPRESSION: Status post endovascular complete revascularization of occluded prominent superior division of the left middle cerebral artery using 3 passes with the EmboTrap 5 mm x 33 mm retrieval device achieving a TICI 3 revascularization. PLAN: Transferred to the  PACU and neuro ICU for further management. Electronically Signed   By: Luanne Bras M.D.   On: 12/05/2017 09:28   Dg Chest Port 1 View  Result Date: 12/04/2017 CLINICAL DATA:  Central line placement EXAM: PORTABLE CHEST 1 VIEW COMPARISON:  Chest radiograph from earlier today. 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 Endotracheal tube tip is 2.4 cm above the carina. Right internal jugular central venous catheter terminates in the middle third of the SVC. Stable cardiomediastinal silhouette with mild cardiomegaly. No pneumothorax. Moderate right pleural effusion, decreased. Stable small left pleural effusion. Improved aeration in the right  lung with persistent right greater than left bibasilar lung opacities, stable on the left and decreased on the right. No pulmonary edema. IMPRESSION: 1. Right internal jugular central venous catheter terminates in the middle third of the SVC. Well-positioned endotracheal tube. 2. Moderate right pleural effusion, decreased. Improved aeration in the right lung. 3. Stable small left pleural effusion. 4. Right greater than left bibasilar lung opacities, decreased on the right and stable on the left. Electronically Signed   By: Ilona Sorrel M.D.   On: 12/04/2017 11:04   Dg Chest Port 1 View  Result Date: 12/04/2017 CLINICAL DATA:  Respiratory failure EXAM: PORTABLE CHEST 1 VIEW COMPARISON:  Chest radiograph from one day prior. FINDINGS: Right rotated chest radiograph. Endotracheal tube tip is 1.9 cm above the carina. 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 Stable cardiomediastinal silhouette with mild cardiomegaly. No pneumothorax. Worsening essentially complete opacification of the right hemithorax. Small stable left pleural effusion. No overt pulmonary edema in the aerated left lung. IMPRESSION: 1. Endotracheal tube tip 1.9 cm above the carina. 2. Worsening essentially complete opacification of the right hemithorax, which could  represent any combination of right pleural effusion, right lung atelectasis, pneumonia or mass. 3. Stable small left pleural effusion. Electronically Signed   By: Ilona Sorrel M.D.   On: 12/04/2017 08:41   Dg Chest Portable 1 View  Result Date: 12/03/2017 CLINICAL DATA:  Respiratory failure and status post intubation. Status post left thoracentesis on 11/29/2017 and right thoracentesis on 11/28/2017. EXAM: PORTABLE CHEST 1 VIEW COMPARISON:  11/29/2017 and 11/25/2017 chest x-rays at [facility] FINDINGS: Stable cardiac enlargement and appearance of dual-chamber pacemaker. Endotracheal tube present with the tip approximately 2 cm above the carina. Since the prior chest x-ray, there is significant volume loss of the right lung with likely component recurrent pleural fluid as well as consolidation/atelectasis. The left lung shows no evidence of airspace disease, edema or pleural fluid. No pneumothorax identified. IMPRESSION: 1. Endotracheal tube tip approximately 2 cm above the carina. 2. Significant volume loss of right lung since prior chest x-ray with recurrent right pleural effusion as well as underlying consolidation/atelectasis of the right lung. Electronically Signed   By: Aletta Edouard M.D.   On: 12/03/2017 09:55   Ir Percutaneous Art Thrombectomy/infusion Intracranial Inc Diag Angio  Result Date: 12/06/2017 INDICATION: Global aphasia.  Right-sided hemiplegia, left gaze deviation. EXAM: 1. EMERGENT LARGE VESSEL OCCLUSION THROMBOLYSIS (anterior CIRCULATION) COMPARISON:  CT angiogram of the head and neck of 12/03/2017. MEDICATIONS: Ancef 2 g IV antibiotic was administered within 1 hour of the procedure. ANESTHESIA/SEDATION: General anesthesia. CONTRAST:  Isovue 300 approximately 90 cc. FLUOROSCOPY TIME:  Fluoroscopy Time: 40 minutes 12 seconds (2313 mGy). COMPLICATIONS: None immediate. TECHNIQUE: Emergency consent was obtained by 2 physicians involved in the care of this patient. No family members  were available or could be reached on the telephone numbers provided. The patient was then put under general anesthesia by the Department of Anesthesiology at [facility]. The right groin was prepped and draped in the usual sterile fashion. Thereafter using modified Seldinger technique, transfemoral access into the right common femoral artery was obtained without difficulty. Over a 0.035 inch guidewire a 5 French Pinnacle sheath was inserted. Through this, and also over a 0.035 inch guidewire a 5 [place] JB 1 catheter was advanced to the aortic arch region and selectively positioned in the left common carotid artery. FINDINGS: The left common carotid arteriogram demonstrates the left external carotid artery and its major branches  to be widely patent. The left internal carotid artery at the bulb to the cranial skull base also demonstrates wide patency with moderate tortuosity involving the distal 1/3. No evidence of stenosis or of kinking was seen. The petrous, cavernous and supraclinoid segments demonstrate wide patency. A dominant left posterior communicating artery is seen opacifying the left posterior cerebral artery distribution. The left anterior cerebral artery is seen to opacify into the capillary and venous phases. 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 The delayed arterial phase demonstrates partial retrograde opacification of the distal distribution of this occluded vessel from the collaterals arising from the inferior division, and also the left pericallosal artery. PROCEDURE: The diagnostic JB 1 catheter in the left common carotid artery was then exchanged over a 0.035 inch 300 cm Rosen exchange guidewire for an 8 French 55 cm Brite tip neurovascular sheath using biplane roadmap technique and  constant fluoroscopic guidance. Good aspiration obtained from the side port of the neurovascular sheath. This was then connected to continuous heparinized saline infusion. Over the 300 cm Rosen exchange guidewire, an 8 [place] 85 cm FlowGate balloon guide catheter which had been prepped with 50% contrast and 50% heparinized saline infusion was then positioned just proximal to the left common carotid bifurcation. The guidewire was removed. Good aspiration obtained from the hub of the 8 [place] FlowGate guide catheter. A gentle contrast injection demonstrated no evidence of spasms, dissections or of intraluminal filling defects. Over a 0.035 inch Roadrunner guidewire, using biplane roadmap technique and constant fluoroscopic guidance, this was then advanced to the middle 1/3 of the left internal carotid artery. Again the guidewire was removed. Good aspiration obtained from the hub of the [facility] guide catheter. A gentle contrast injection demonstrated no evidence of spasms, dissections or of intraluminal filling defects. No change was seen in the intracranial circulation. At this time, in a coaxial manner and with constant heparinized saline infusion using biplane roadmap technique and constant fluoroscopic guidance, a Trevo ProVue 021 microcatheter was then advanced over a 0.014 inch Softip Synchro micro guidewire to the distal end of the FlowGate guide catheter. With the micro guidewire leading with a J-tip configuration, the combination was then navigated without difficulty to the supraclinoid left ICA. The wire was then advanced using a torque device into the left middle cerebral artery and super selectively advanced through the occluded M2 branch of the superior division M2 M3 region followed by the microcatheter. The guidewire was removed. Good aspiration obtained from the hub of the microcatheter. A gentle contrast injection demonstrated robust distal flow. A EmboTrap 5 mm x 33 mm retrieval device was then  advanced in a coaxial manner with constant heparinized saline infusion using biplane roadmap technique and constant fluoroscopic guidance to the distal end of the microcatheter. The proximal and the distal landing zones were then defined. The O ring on the delivery microcatheter was then loosened. With slight forward gentle traction with the right hand on the delivery micro guidewire, with the left hand the delivery microcatheter was then retrieved deploying the retrieval device. Control arteriogram performed through the 8 [place] FlowGate guide catheter in the left internal carotid artery demonstrated a TICI 2b revascularization. The proximal portion of the retrieval device was then captured in the microcatheter. Proximal flow arrest was then initiated by inflating the balloon of the [facility] guide catheter  in the left internal carotid artery. Thereafter using constant aspiration with a 60 mL syringe at the hub of the [facility] guide catheter, the combination of the retrieval device in the microcatheter were then gently retrieved and removed. Aspiration was continued as the balloon was then deflated. Brisk back bleed of blood was noted at the hub of the [facility] guide catheter. A control arteriogram performed through the FlowGate 8 [place] guide catheter demonstrated no significant change in the occluded M2 superior division. This prompted a second pass using the above combination as described above. Again access was obtained distal to the occluded artery in the M2 superior division followed by the placement of the microcatheter. After having verified safe position of tip of the microcatheter, the EmboTrap 5 mm x 33 mm retrieval device was then deployed as described above. A control arteriogram performed after deployment revealed continued occlusion of the superior division M2 branch. However, with proximal flow arrest as described above, the combination of the retrieval device and the microcatheter were then  retrieved. Aspiration with a 60 mL syringe at the hub of the [facility] guide catheter was continued as the balloon was then deflated. The aspirate did not contain any clots. The interstices of the retrieval device were also devoid of any clot debris. A third pass was then made at this time with the aid of an intermediate 5 French 115 cm Catalyst guide catheter inside of which was an [employer] microcatheter. This combination was then advanced over a 0.014 inch Softip Synchro micro guidewire to the supraclinoid left ICA. Access into the occluded M2 superior division of the right middle cerebral artery was then made with the micro guidewire followed by the microcatheter. The micro guidewire was then removed. Good aspiration was obtained at the hub of the microcatheter. Gentle contrast injection demonstrated good antegrade distal flow. The proximal and the distal landing zone of the retrieval device was then again defined. The O ring on the delivery microcatheter was loosened. With slight gentle traction with the right hand on the delivery micro guidewire, with the left hand the delivery microcatheter was retrieved unsheathing the retrieval device. A control arteriogram performed through the 5 French intermediary Catalyst guide catheter in the supraclinoid left ICA demonstrated a TICI 3 revascularization. The combination of the 5 [place] Catalyst guide catheter with the microcatheter and the retrieval device were then retrieved and removed as constant aspiration was applied with a 60 mL syringe at the hub of the [facility] guide catheter in the left internal carotid artery, and also with a 25 mL syringe on the 5 [place] Catalyst guide catheter. The tip of the Catalyst guide catheter was now at the proximal portion of the retrieval device. The combination was then retrieved and removed as constant aspiration was continued as flow arrest was reversed in the left internal carotid artery. A few specks of clot were seen in  the aspirate. The retrieval device was devoid of any clots. After having established free back bleed at the hub of the [facility] guide catheter in the left internal carotid artery, a control arteriogram performed through the guide catheter demonstrated a TICI 3 reperfusion. No evidence of extravasation or of mass-effect on the major vessel was seen. The left posterior communicating artery and the left anterior cerebral artery continued to demonstrate wide patency in their entirety. The FlowGate guide catheter and the 8 French neurovascular sheath were then removed over a J-tip guidewire for an 8 [place] Pinnacle sheath. This was then removed with successful  hemostasis in the right groin puncture site. The right groin appeared soft without evidence of bleeding or hematoma. Throughout the procedure, the patient's blood pressure and neurological status remained stable. The patient's distal pulses remained Dopplerable in the posterior tibials and dorsalis pedis unchanged from prior to the procedure. A Dyna CT of the brain performed on the table demonstrated no gross evidence of intracranial hemorrhages. The ventricles appeared symmetrical and unchanged. The patient was then transported to the PACU intubated prior to being sent to the neuro ICU. IMPRESSION: Status post endovascular complete revascularization of occluded prominent superior division of the left middle cerebral artery using 3 passes with the EmboTrap 5 mm x 33 mm retrieval device achieving a TICI 3 revascularization. PLAN: Transferred to the PACU and neuro ICU for further management. Electronically Signed   By: Luanne Bras M.D.   On: 12/05/2017 09:28   Ct Head Code Stroke Wo Contrast  Result Date: 12/03/2017 CLINICAL DATA:  Code stroke. Acute onset of unresponsiveness while in the emergency department. Patient is known nonverbal. Acute onset of right-sided weakness. EXAM: CT HEAD WITHOUT CONTRAST TECHNIQUE: Contiguous axial images were obtained from  the base of the skull through the vertex without intravenous contrast. COMPARISON:  None. FINDINGS: Brain: No acute infarct, hemorrhage, or mass lesion is present. 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 Hypodense infarct is present in the high right parietal lobe, likely also remote. A remote infarct of the left frontal operculum is present. Associated volume loss is noted. No acute left-sided infarct is present. No acute hemorrhage or mass lesion is present. Remote infarcts are present posteriorly within the left cerebellum. Brainstem is unremarkable. No significant extra-axial fluid collection is present. No significant extra-axial fluid collection is present. Vascular: Atherosclerotic calcifications are present within the internal carotid arteries bilaterally. There is no hyperdense vessel. Skull: Calvarium is intact. No focal lytic or blastic lesions are present. Sinuses/Orbits: The paranasal sinuses are clear. A remote right orbital blowout fracture is present. A right lens replacement is present. Globes and orbits are otherwise within normal limits. ASPECTS [facility] Stroke Program Early CT Score) - Ganglionic level infarction (caudate, lentiform nuclei, internal capsule, insula, M1-M3 cortex): 7/7 - Supraganglionic infarction (M4-M6 cortex): 3/3. Total score (0-10 with 10 being normal): 10/10. IMPRESSION: 1. Infarcts involving the high right parietal lobe and left frontal operculum appear remote. 2. Remote lacunar infarct of the left caudate head. 3. Remote infarcts of the posterior left cerebellum. 4. No acute infarct. 5. 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 6. ASPECTS is 10/10 These results were called by telephone at the time of interpretation on 12/03/2017 at 9:03 am to Dr. Blanchie Dessert , who verbally acknowledged these results. Electronically Signed   By: San Morelle M.D.   On: 12/03/2017 09:08     Not all labs, radiology exams or other studies done during hospitalization come through on my EPIC note; however they are reviewed by me.    Assessment and Plan  Acute CVA/Left MCA infarct/embolic, complicated with hemorrhagic conversion- CT showed infarct involving the right high parietal lobe and left frontal operculum; CT NGO showed proximal posterior division left M2 occlusion and more distal anterior division left M2/M3 occlusion as well as occluded left vertebral artery with minimal reconstitution in the neck.  Patient underwent revascularization of the left MCA; follow-up CT showed a left inferior insular small intracranial hemorrhage; recommended to hold full  anticoagulation, for now continue ASA, patient will need follow-up CT in 4 weeks to evaluate the possibility of full anticoagulation; patient had a prolonged hospitalization and has regained some right-sided strength SNF -patient is admitted for OT/PT; will continue ASA 81 mg daily  Acute respiratory failure with hypoxia/right-sided heart failure acute on chronic/severe pulmonary hypertension/right pleural effusion/severe RV dilatation/significantly reduced right systolic function/pulmonary hypertension/sarcoidosis- patient underwent ultrasound-guided thoracentesis, obtaining 1.1 L of amber transudate fluid; she has been resumed on diuresis; patient is preload dependent due to her severe pulmonary hypertension; patient is discharged on room air with O2 sats of 99% SNF -plan to continue Zaroxolyn 2.5 mg twice weekly and Demadex 20 mg daily; will follow up BMP to follow  Chronic atrial fibrillation-status post pacemaker and prior hospitalization; paced rhythm SNF -continue amiodarone 200 mg daily and Toprol-XL 25 mg daily with aspirin 81 mg daily;  full anticoagulation being held  Acute renal failure/hyponatremia this patient was diuresed carefully and her creatinine has reached a plateau of 1.35;  a.m. 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- no signs of encephalopathy, INR was elevated but last INR was 1.3 which is improved; no significant ascites; likely she has congestive hepatopathy due to her severe right heart failure SNF -we will closely monitor patient's mental status  Diabetes mellitus type 2 SNF -there are no diabetic medications on patient's discharge summary; I have reviewed patient's glucose in-house and she runs in the 200s; patient's A1c 8.6 so we will continue sliding scale insulin for patient and try to convert her to a long-acting regimen  Pericardial effusion-chronic no evidence of taponade SNF -outpatient cardiology follow-up  Hypertension SNF -continue Toprol-XL 25 mg p.o. Daily, Zaroxolyn 2.5 mg twice daily and Demadex 20 mg daily  GERD SNF -not stated as uncontrolled; continue Protonix 40 mg daily    Time spent much greater than 45 minutes;> 50% of time with patient was spent reviewing records, labs, tests and studies, counseling and developing plan of care  Inocencio Homes, MD

## 2018-01-05 ENCOUNTER — Non-Acute Institutional Stay (SKILLED_NURSING_FACILITY): Payer: Medicare HMO | Admitting: Internal Medicine

## 2018-01-05 ENCOUNTER — Encounter: Payer: Self-pay | Admitting: Internal Medicine

## 2018-01-05 DIAGNOSIS — D860 Sarcoidosis of lung: Secondary | ICD-10-CM

## 2018-01-05 DIAGNOSIS — I10 Essential (primary) hypertension: Secondary | ICD-10-CM

## 2018-01-05 DIAGNOSIS — K7031 Alcoholic cirrhosis of liver with ascites: Secondary | ICD-10-CM

## 2018-01-05 DIAGNOSIS — E119 Type 2 diabetes mellitus without complications: Secondary | ICD-10-CM

## 2018-01-05 DIAGNOSIS — I482 Chronic atrial fibrillation, unspecified: Secondary | ICD-10-CM

## 2018-01-05 DIAGNOSIS — I272 Pulmonary hypertension, unspecified: Secondary | ICD-10-CM

## 2018-01-05 DIAGNOSIS — I6389 Other cerebral infarction: Secondary | ICD-10-CM

## 2018-01-05 DIAGNOSIS — I313 Pericardial effusion (noninflammatory): Secondary | ICD-10-CM

## 2018-01-05 DIAGNOSIS — I50813 Acute on chronic right heart failure: Secondary | ICD-10-CM | POA: Diagnosis not present

## 2018-01-05 DIAGNOSIS — I6602 Occlusion and stenosis of left middle cerebral artery: Secondary | ICD-10-CM | POA: Diagnosis not present

## 2018-01-05 DIAGNOSIS — E1159 Type 2 diabetes mellitus with other circulatory complications: Secondary | ICD-10-CM

## 2018-01-05 DIAGNOSIS — I63412 Cerebral infarction due to embolism of left middle cerebral artery: Secondary | ICD-10-CM | POA: Diagnosis not present

## 2018-01-05 DIAGNOSIS — J90 Pleural effusion, not elsewhere classified: Secondary | ICD-10-CM

## 2018-01-05 DIAGNOSIS — I3139 Other pericardial effusion (noninflammatory): Secondary | ICD-10-CM

## 2018-01-05 DIAGNOSIS — K219 Gastro-esophageal reflux disease without esophagitis: Secondary | ICD-10-CM

## 2018-01-05 DIAGNOSIS — J9601 Acute respiratory failure with hypoxia: Secondary | ICD-10-CM

## 2018-01-05 DIAGNOSIS — I152 Hypertension secondary to endocrine disorders: Secondary | ICD-10-CM

## 2018-01-05 DIAGNOSIS — E871 Hypo-osmolality and hyponatremia: Secondary | ICD-10-CM

## 2018-01-05 DIAGNOSIS — N179 Acute kidney failure, unspecified: Secondary | ICD-10-CM

## 2018-01-05 NOTE — Progress Notes (Signed)
Location:  [place] Room Number: 284X Place of Service:  SNF (31) Noah Delaine. Sheppard Coil, MD  PCP: Karleen Hampshire., MD Patient Care Team: Karleen Hampshire., MD as PCP - General (Internal Medicine)  Extended Emergency Contact Information Primary Emergency Contact: Dehner,ernest Mobile Phone: [phone] Relation: Other Secondary Emergency Contact: Lafe Garin Home Phone: [phone] Mobile Phone: [phone] Relation: Friend  No Known Allergies  Chief Complaint  Patient presents with  . Discharge Note    Discharged from SNF    HPI:  77 y.o. 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  Patient had ultrasound-guided thoracentesis and was started on diuretics.  Patient presented to [facility] with complaints of shortness of breath and edema in her bilateral lower extremities over the past month and then in the ED became acutely a phasic with dense hemiplegia as well as forced left gaze deviation.  Emergent stroke consult at that time had patient had a NIHSS score of 26.  Immediate CT noncontrast was done that showed infarct involving the right parietal lobe and left frontal lobe.  Patient could not undergo CT angiogram due to respiratory status and she was unable to follow commands or protect her airway so she was intubated immediately and transferred to [facility] for  further stroke management.  Patient underwent revascularization of the left MCA.  Patient also underwent ultrasound-guided thoracentesis on the right for pleural effusion.  Patient had prolonged hospital stay, her right hemiplegia slowly improved.  Patient was admitted to skilled nursing facility for OT/PT and is now ready to be discharged to an assisted living facility.    Past Medical History:  Diagnosis Date  . Cancer (HCC)    B/L  . CHF (congestive heart failure) ([place])   . Diabetes mellitus without complication ([place])   . Sarcoidosis     Past Surgical History:  Procedure Laterality Date  . IR CT HEAD LTD  12/03/2017  . IR PERCUTANEOUS ART THROMBECTOMY/INFUSION INTRACRANIAL INC DIAG ANGIO  12/03/2017  . IR THORACENTESIS ASP PLEURAL SPACE W/IMG GUIDE  12/09/2017  . MASTECTOMY Left 2004  . PACEMAKER IMPLANT    . RADIOLOGY WITH ANESTHESIA N/A 12/03/2017   Procedure: CODE STROKE;  Surgeon: Luanne Bras, MD;  Location: [place];  Service: Radiology;  Laterality: N/A;     reports that she has never smoked. She has never used smokeless tobacco. She reports that she does not drink alcohol or use drugs. Social History   Socioeconomic History  . Marital status: Divorced    Spouse name: Not on file  . Number of children: Not on file  . Years of education: Not on file  . Highest education level: Not on file  Occupational History  . Not on file  Social Needs  . Financial resource strain: Not on file  . Food insecurity:    Worry: Not on file    Inability: Not on file  . Transportation needs:    Medical: Not on file    Non-medical: Not on file  Tobacco Use  .  Smoking status: Never Smoker  . Smokeless tobacco: Never Used  Substance and Sexual Activity  . Alcohol use: Never    Frequency: Never  . Drug use: Never  . Sexual activity: Not on file  Lifestyle  . Physical activity:    Days per week: Not on file    Minutes per session: Not on file  . Stress: Not on file  Relationships    . Social connections:    Talks on phone: Not on file    Gets together: Not on file    Attends religious service: Not on file    Active member of club or organization: Not on file    Attends meetings of clubs or organizations: Not on file    Relationship status: Not on file  . Intimate partner violence:    Fear of current or ex partner: Not on file    Emotionally abused: Not on file    Physically abused: Not on file    Forced sexual activity: Not on file  Other Topics Concern  . Not on file  Social History Narrative  . Not on file    Pertinent  Health Maintenance Due  Topic Date Due  . FOOT EXAM  02/04/1951  . OPHTHALMOLOGY EXAM  02/04/1951  . URINE MICROALBUMIN  [birth year]04/1951  . COLONOSCOPY  02/04/1992  . DEXA SCAN  02/04/2007  . INFLUENZA VACCINE  03/02/2018  . PNA vac Low Risk Adult (2 of 2 - PPSV23) 05/12/2018  . HEMOGLOBIN A1C  06/06/2018    Medications: Allergies as of 01/05/2018   No Known Allergies     Medication List        Accurate as of 01/05/18 11:59 PM. Always use your most recent med list.          amiodarone 200 MG tablet Commonly known as:  PACERONE Take 200 mg by mouth daily.   aspirin 81 MG EC tablet Take 1 tablet (81 mg total) by mouth daily.   insulin NPH-regular Human (70-30) 100 UNIT/ML injection Commonly known as:  NOVOLIN 70/30 Inject 22 Units into the skin daily with breakfast.   metolazone 2.5 MG tablet Commonly known as:  ZAROXOLYN Take 2.5 mg by mouth. Twice weekly   metoprolol succinate 25 MG 24 hr tablet Commonly known as:  TOPROL-XL Take 25 mg by mouth daily.   NUTRITIONAL SUPPLEMENTS PO Medpass NSA initiate 120 ml by mouth 2 times a day.   OXYGEN Inhale 2 L into the lungs continuous.   pantoprazole 40 MG tablet Commonly known as:  PROTONIX Take 40 mg by mouth daily.   tamoxifen 20 MG tablet Commonly known as:  NOLVADEX Take 20 mg by mouth daily.   torsemide 10 MG tablet Commonly known as:  DEMADEX Take 10 mg by  mouth daily.        Vitals:   01/05/18 1234  BP: 100/64  Pulse: 80  Resp: (!) 22  Temp: (!) 96.5 F (35.8 C)  TempSrc: Oral  SpO2: 97%  Weight: 138 lb 3.2 oz (62.7 kg)  Height: 5\' 6"  (1.676 m)   Body mass index is 22.31 kg/m.  Physical Exam  GENERAL APPEARANCE: Alert, conversant. No acute distress.  HEENT: Unremarkable. RESPIRATORY: Breathing is even, unlabored. Lung sounds are clear   CARDIOVASCULAR: Heart RRR no murmurs, rubs or gallops. No peripheral edema.  GASTROINTESTINAL: Abdomen is soft, non-tender, not distended w/ normal bowel sounds.  NEUROLOGIC: Cranial nerves 2-12 grossly intact. Moves all extremities   Labs reviewed: Basic Metabolic Panel:  Recent Labs    12/05/17 1854 12/06/17 0431 12/07/17 0334  12/12/17 1050 12/13/17 0731 12/14/17 0729  NA  --  136 139   < > 132* 136 136  K  --  4.2 4.5   < > 4.8 4.1 4.3  CL  --  103 106   < > 100* 100* 100*  CO2  --  24 26   < > 23 25 25   GLUCOSE  --  225* 169*   < > 171* 146* 137*  BUN  --  24* 25*   < > 23* 23* 19  CREATININE  --  1.32* 1.36*   < > 1.35* 1.35* 1.28*  CALCIUM  --  8.4* 8.7*   < > 8.6* 8.5* 9.1  MG 1.8 2.0 1.9  --   --   --   --   PHOS 3.5 3.7 3.6  --   --   --   --    < > = values in this interval not displayed.   No results found for: [facility] Liver Function Tests: Recent Labs    12/03/17 0909 12/09/17 0450  AST 19  --   ALT 10*  --   ALKPHOS 48  --   BILITOT 1.0  --   PROT 5.8* 6.9  ALBUMIN 2.8*  --    No results for input(s): LIPASE, AMYLASE in the last 8760 hours. No results for input(s): AMMONIA in the last 8760 hours. CBC: Recent Labs    12/03/17 0909  12/04/17 0417  12/08/17 1258 12/09/17 0450 12/10/17 0636  WBC 7.1  --  19.0*   < > 8.3 7.9 7.9  NEUTROABS 4.5  --  15.9*  --   --   --   --   HGB 10.1*   < > 9.1*   < > 9.2* 8.4* 8.4*  HCT 28.6*   < > 28.1*   < > 28.5* 25.6* 25.3*  MCV 68.1*  --  69.7*   < > 69.9* 69.4* 69.3*  PLT 432*  --  408*   < > 315 352 361    < > = values in this interval not displayed.   Lipid Recent Labs    12/03/17 1630 12/04/17 0417  CHOL  --  70  HDL  --  18*  LDLCALC  --  38  TRIG 63 72   Cardiac Enzymes: Recent Labs    12/03/17 0909  TROPONINI <0.03   BNP: Recent Labs    12/03/17 0909 12/05/17 0451  BNP 381.5* 701.0*   CBG: Recent Labs    12/14/17 2155 12/15/17 0612 12/15/17 1141  GLUCAP 167* 131* 154*    Procedures and Imaging Studies During Stay: Dg Chest 1 View  Result Date: 12/09/2017 CLINICAL DATA:  77 year old female with shortness of breath. EXAM: CHEST  1 VIEW COMPARISON:  12/06/2017 portable chest and earlier. FINDINGS: AP view at 1726 hours. Extubated. Enteric tube removed. Right IJ central line removed. The patient remains rotated to the right. Stable cardiomegaly and mediastinal contours. Pericardial effusion was demonstrated by CT on 12/04/2017. Stable left chest dual lead cardiac pacemaker. Improved right lung ventilation since that time although continued bilateral veiling lower lung pulmonary opacities and dense retrocardiac opacity. No pneumothorax. The upper lobe pulmonary vascularity is normal. Negative visible bowel gas pattern. IMPRESSION: 1. Extubated.  Enteric tube and right IJ central line removed. 2. Improved ventilation since 12/06/2017, but persistent small to moderate bilateral pleural effusions with bilateral lower lobe collapse or consolidation.  3. Cardiomegaly. Moderate pericardial effusion demonstrated recently on 12/04/2017 CT. Electronically Signed   By: Genevie Ann M.D.   On: 12/09/2017 17:58    Assessment/Plan:   Cerebrovascular accident (CVA) due to embolism of left middle cerebral artery ([place])  Middle cerebral artery embolism, left  Acute respiratory failure with hypoxia (HCC)  Acute on chronic right-sided congestive heart failure (HCC)  Acute hemorrhagic infarction of brain (HCC)  Pulmonary hypertension (HCC)  Recurrent right pleural effusion  Sarcoidosis  of lung (HCC)  Atrial fibrillation, chronic (HCC)  Acute renal failure, unspecified acute renal failure type (HCC)  Hyponatremia  Alcoholic cirrhosis of liver with ascites (HCC)  Diabetes mellitus without complication (HCC)  Pericardial effusion  Hypertension associated with diabetes ([place])  Gastroesophageal reflux disease without esophagitis   Patient is being discharged with the following home health services: OT/PT/nursing/aid  Patient is being discharged with the following durable medical equipment: Semi-electric hospital bed, standard wheelchair, rolling walker, stationary and portable gaseous oxygen at 2 L/min via nasal cannula continuous  Patient has been advised to f/u with their PCP in 1-2 weeks to bring them up to date on their rehab stay.  Social services at facility was responsible for arranging this appointment.  Pt was provided with a 30 day supply of prescriptions for medications and refills must be obtained from their PCP.  For controlled substances, a more limited supply may be provided adequate until PCP appointment only.  Medications have been reconciled.  Time spent greater than 30 minutes;> 50% of time with patient was spent reviewing records, labs, tests and studies, counseling and developing plan of care  Noah Delaine. Sheppard Coil, MD

## 2018-01-08 DIAGNOSIS — I3139 Other pericardial effusion (noninflammatory): Secondary | ICD-10-CM | POA: Insufficient documentation

## 2018-01-08 DIAGNOSIS — I313 Pericardial effusion (noninflammatory): Secondary | ICD-10-CM | POA: Insufficient documentation

## 2018-01-18 ENCOUNTER — Ambulatory Visit: Payer: Medicare HMO | Admitting: Adult Health

## 2018-01-18 ENCOUNTER — Encounter: Payer: Self-pay | Admitting: Adult Health

## 2018-01-19 ENCOUNTER — Telehealth: Payer: Self-pay

## 2018-01-19 NOTE — Telephone Encounter (Signed)
Patient no show for appt on 01/18/2018.

## 2018-03-02 DEATH — deceased

## 2018-03-14 ENCOUNTER — Other Ambulatory Visit: Payer: Self-pay

## 2018-03-20 ENCOUNTER — Other Ambulatory Visit: Payer: Self-pay

## 2018-03-29 ENCOUNTER — Other Ambulatory Visit: Payer: Self-pay

## 2018-12-30 IMAGING — CT CT CHEST W/O CM
2 of 3 series · 15 of 36 positions shown, 18 images · non-contrast
Comparison: Chest radiograph 12/03/2017 and CT chest 01/01/2017.

ADDENDUM:
The upper abdominal section of the original report was inadvertently
not included and is dictated below.

UPPER ABDOMEN:
Liver margin appears irregular. Decreased attenuation in the dome is
likely due to dilated hepatic veins. Stones are seen in the
gallbladder. Probable adrenal thickening. Visualized portions of the
kidneys, spleen, pancreas and stomach are grossly unremarkable.
Upper abdominal lymph nodes do not appear enlarged by CT size
criteria. Ascites.
CLINICAL DATA: Pleural effusions.
EXAM:
CT CHEST WITHOUT CONTRAST
TECHNIQUE: Multidetector CT imaging of the chest was performed following the
standard protocol without IV contrast.

[Series 3: chest w/o 2mm st · axial · non-contrast · 0.64mm/px · z∈[-781,-497]mm · 12 of 168 slices shown, 15 images]
[im 13/168  mediastinal]
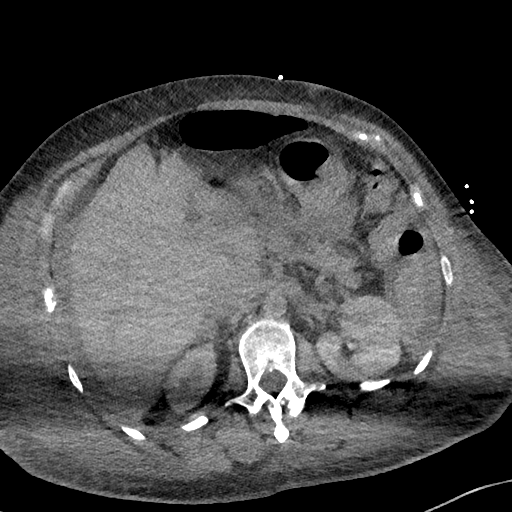
[im 13/168  lung]
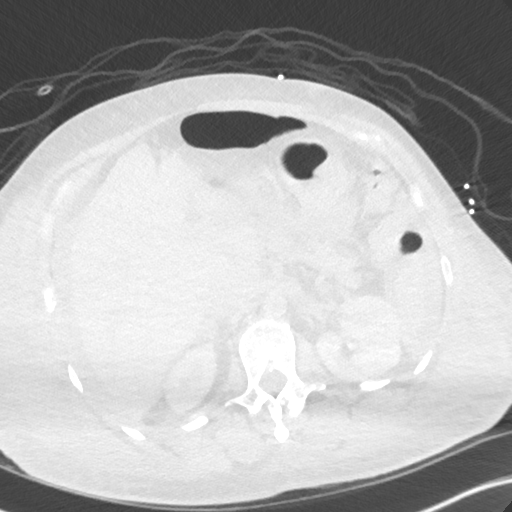
[im 25/168  lung]
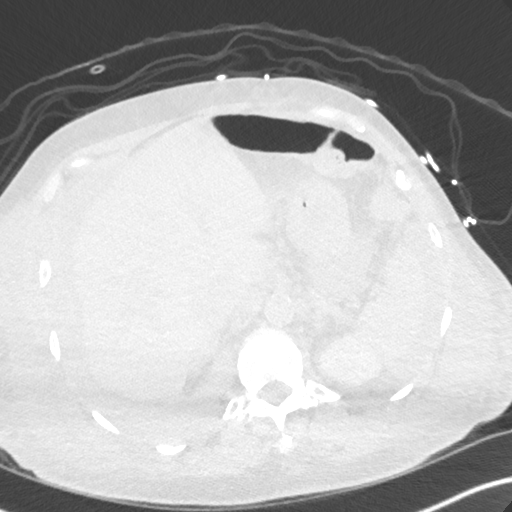
[im 38/168  lung]
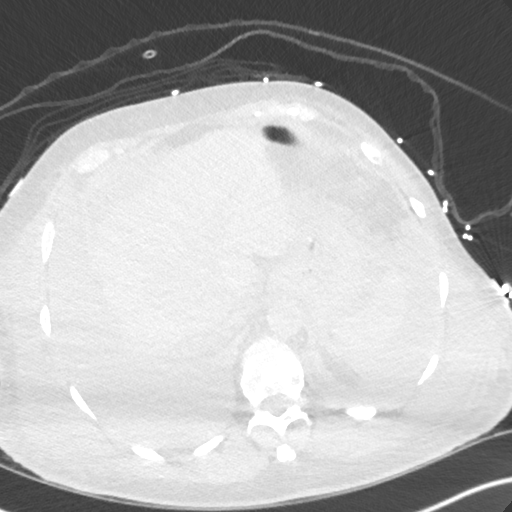
[im 50/168  lung]
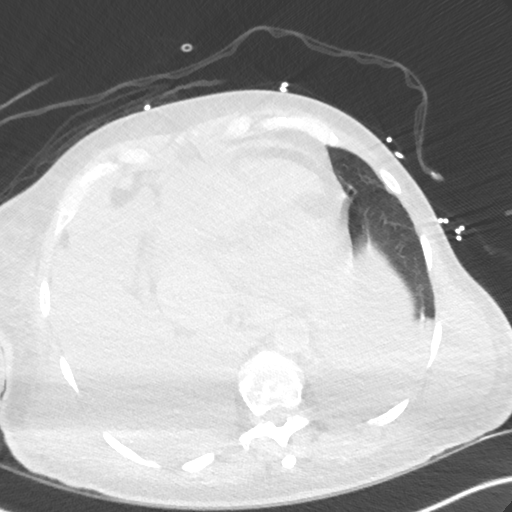
[im 62/168  mediastinal]
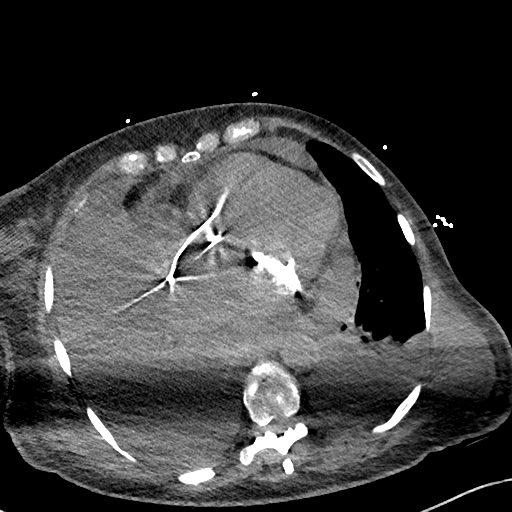
[im 62/168  lung]
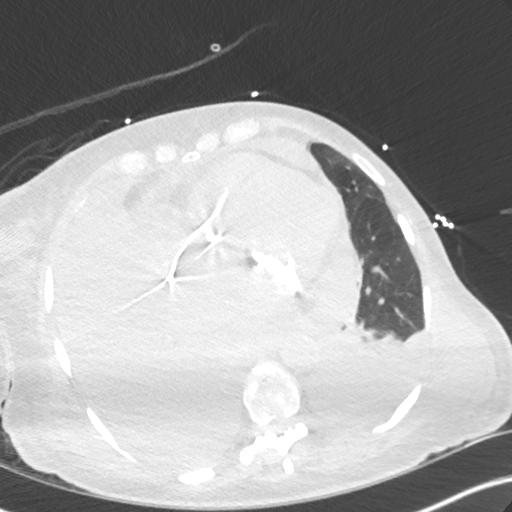
[im 75/168  lung]
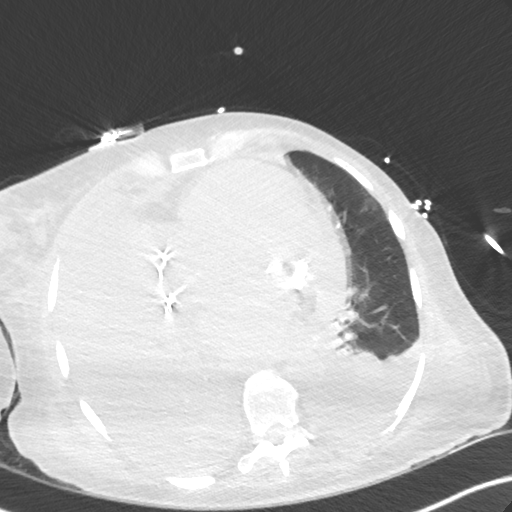
[im 93/168  lung]
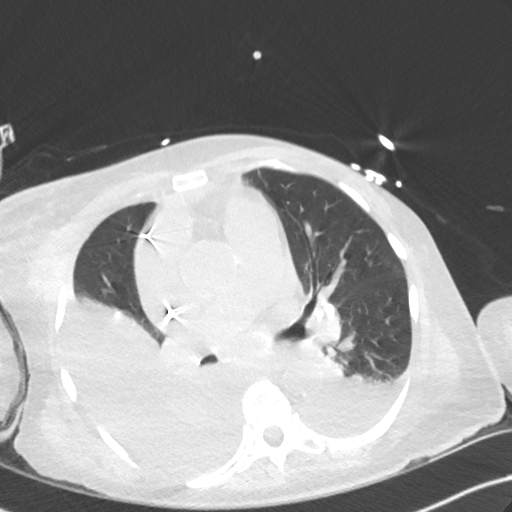
[im 106/168  lung]
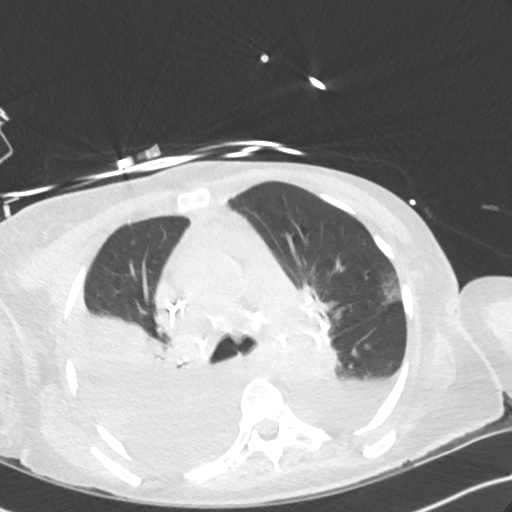
[im 118/168  mediastinal]
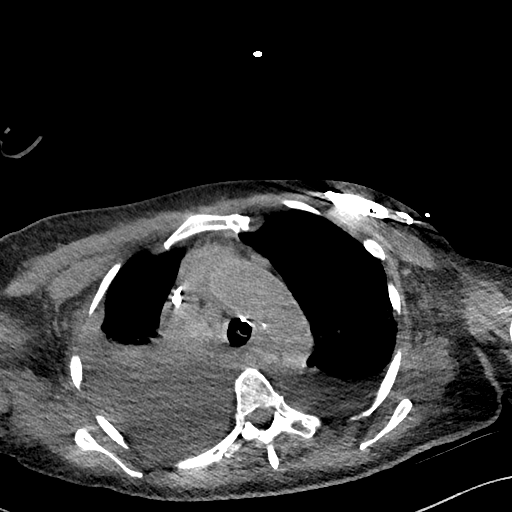
[im 118/168  lung]
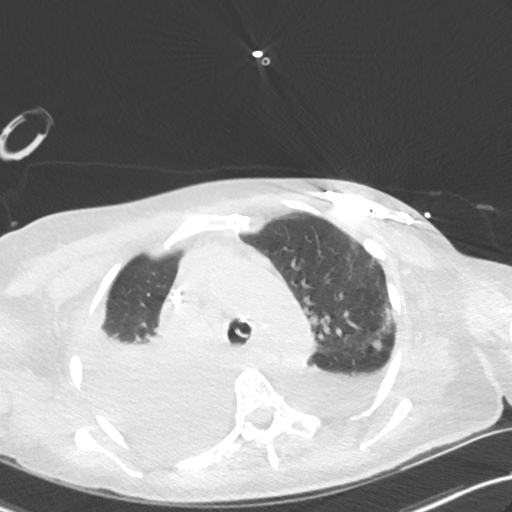
[im 130/168  lung]
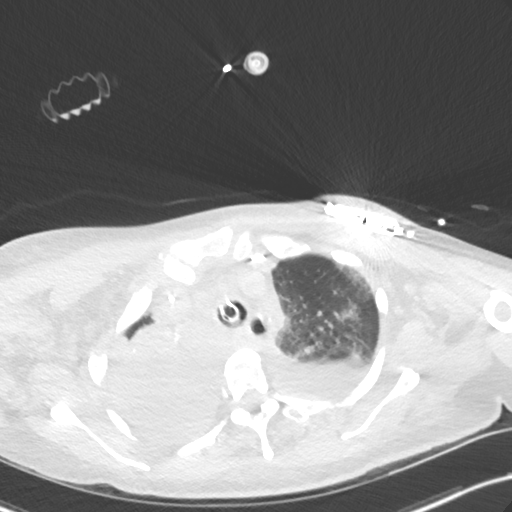
[im 143/168  lung]
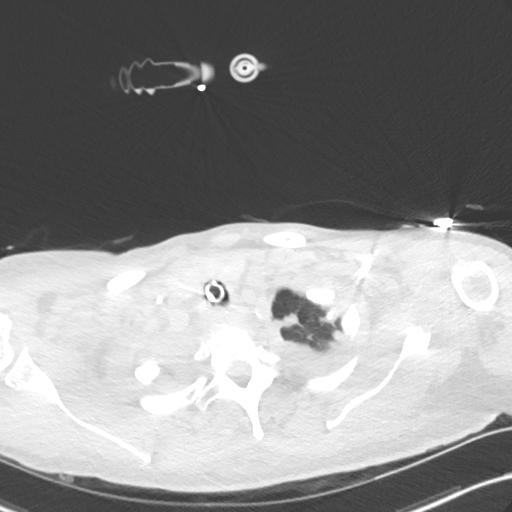
[im 155/168  lung]
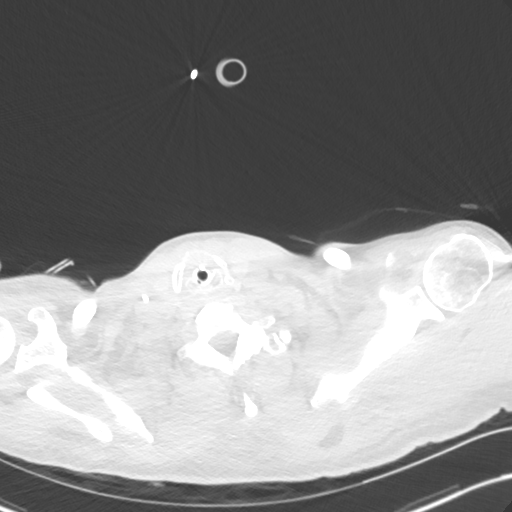

[Series 5: chest w/o 3mm st cor · coronal · non-contrast · 0.60mm/px · 3 of 101 slices shown]
[im 21/101  lung]
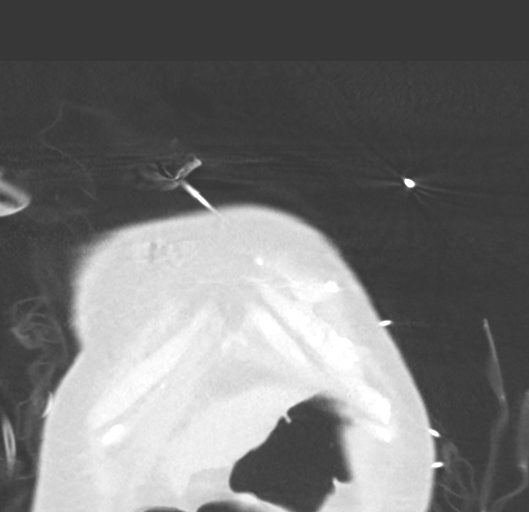
[im 41/101  lung]
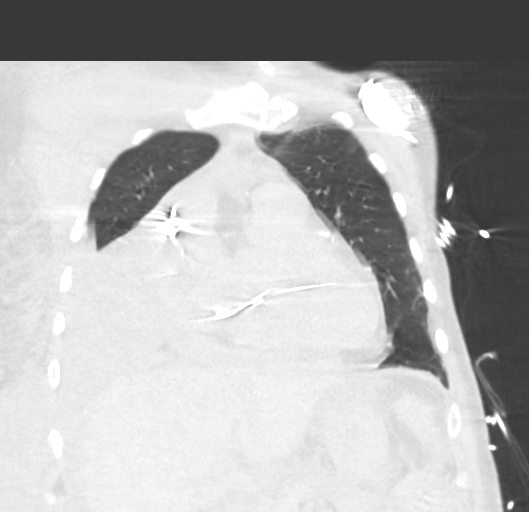
[im 61/101  lung]
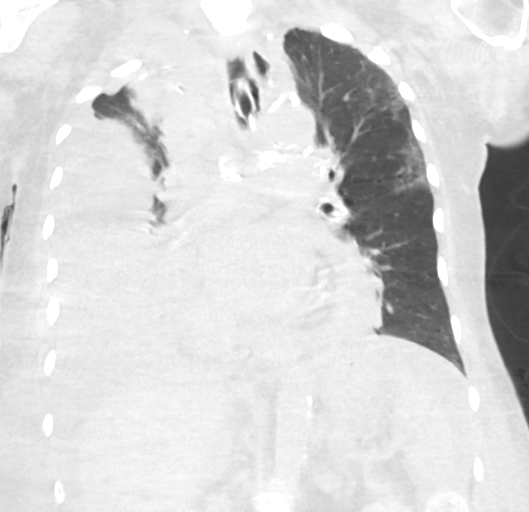

[15 of 36 positions shown; findings below may reference images not displayed]

IMPRESSION: 8. Marginal irregularity of the liver is indicative of cirrhosis.

9.       Cholelithiasis.

10.     Ascites.
FINDINGS: Cardiovascular: Right IJ central line terminates in the SVC.
Atherosclerotic calcification of the arterial vasculature, including
coronary arteries. Pulmonary arteries are enlarged. Heart is
moderately enlarged with a moderate pericardial effusion.

Mediastinum/Nodes: Low internal jugular and mediastinal adenopathy
measures up to 1.1 cm in the right paratracheal station. There are
calcified mediastinal and bihilar lymph nodes. Distal periesophageal
lymph node measures 11 mm, as before. No axillary adenopathy.

Lungs/Pleura: Image quality is degraded by respiratory motion. Large
right pleural effusion and adjacent collapse/consolidation obscure
the majority of the right hemithorax. Nodular areas in the apex of
the left upper lobe are grossly stable. Scattered areas of
peribronchovascular ground-glass in the left upper lobe, possibly
infectious or inflammatory in etiology. Moderate left pleural
effusion with collapse/consolidation in the left lower lobe.
Endotracheal tube terminates at the orifice of the left mainstem
bronchus.

Musculoskeletal: Degenerative changes in the spine. No worrisome
lytic or sclerotic lesions.
IMPRESSION: 1. Endotracheal tube is low lying, at the orifice of the left
mainstem bronchus. Pulling back 3-4 cm would likely better position
the tip above the carina.
2. Large right pleural effusion with collapse/consolidation in the
adjacent right lung.
3. Moderate left pleural effusion with collapse/consolidation in the
left lower lobe.
4. Scattered areas of nodularity and ground-glass in the left upper
lobe, poorly evaluated due to respiratory motion.
5. Moderate pericardial effusion.
6. Aortic atherosclerosis ([id number]). Coronary artery
calcification.
7. Enlarged pulmonary arteries, indicative of pulmonary arterial
hypertension.

## 2019-01-04 IMAGING — US IR THORACENTESIS ASP PLEURAL SPACE W/IMG GUIDE
1 series · 4 of 4 positions shown · non-contrast
Comparison: none

INDICATION: Patient with right pleural effusion. Request is made for diagnostic
and therapeutic thoracentesis.

[Series 1: ir (id) (id)/(id)/(id) ir · 4 of 4 slices shown]
[im 1/4]
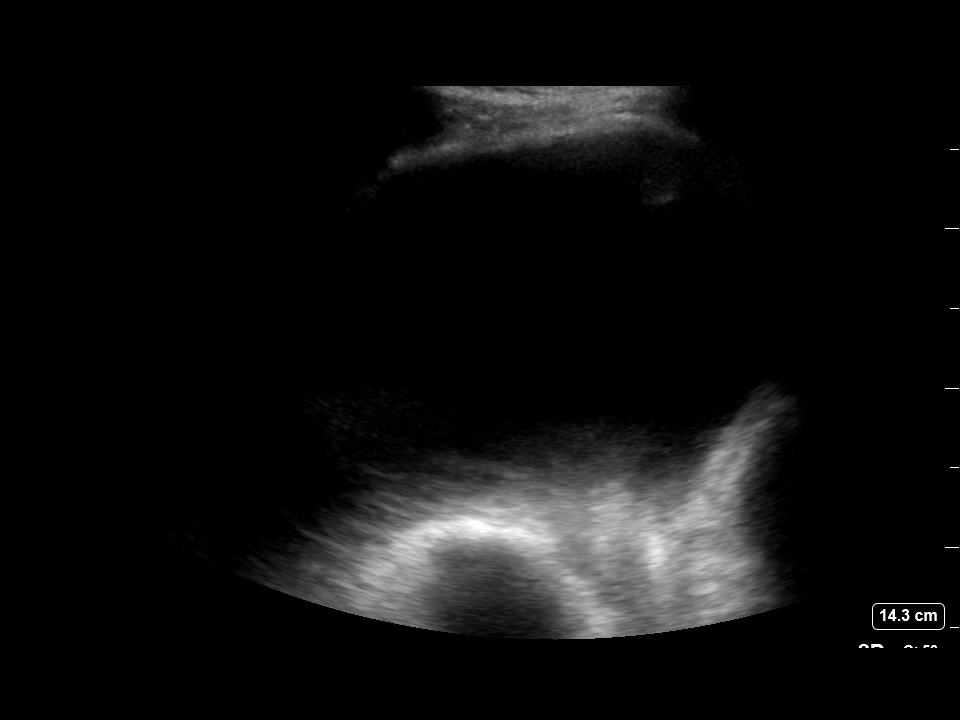
[im 2/4]
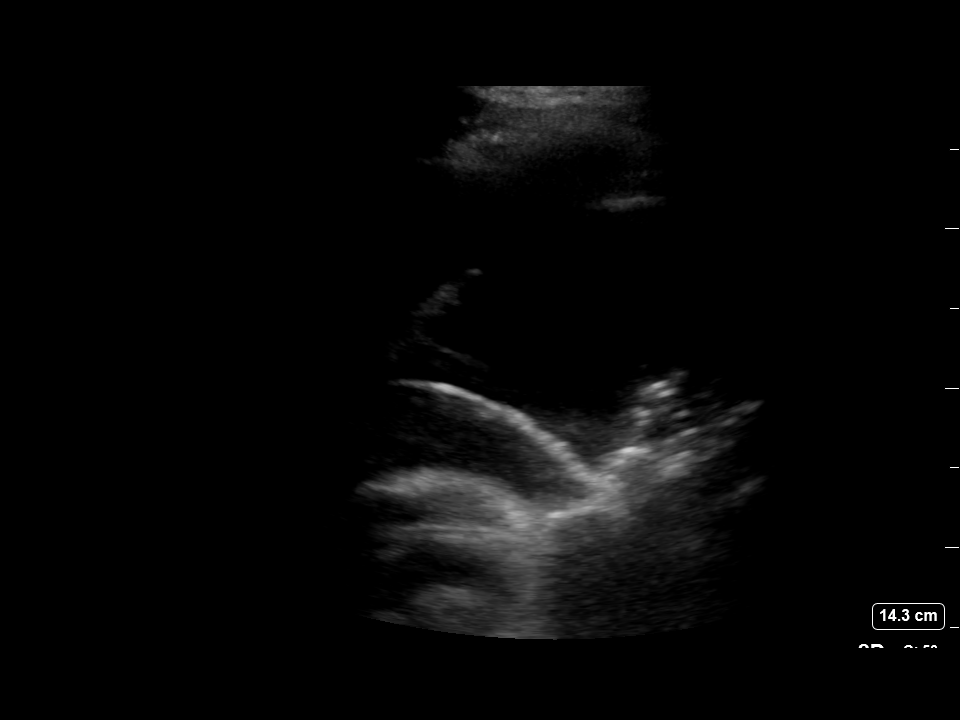
[im 3/4]
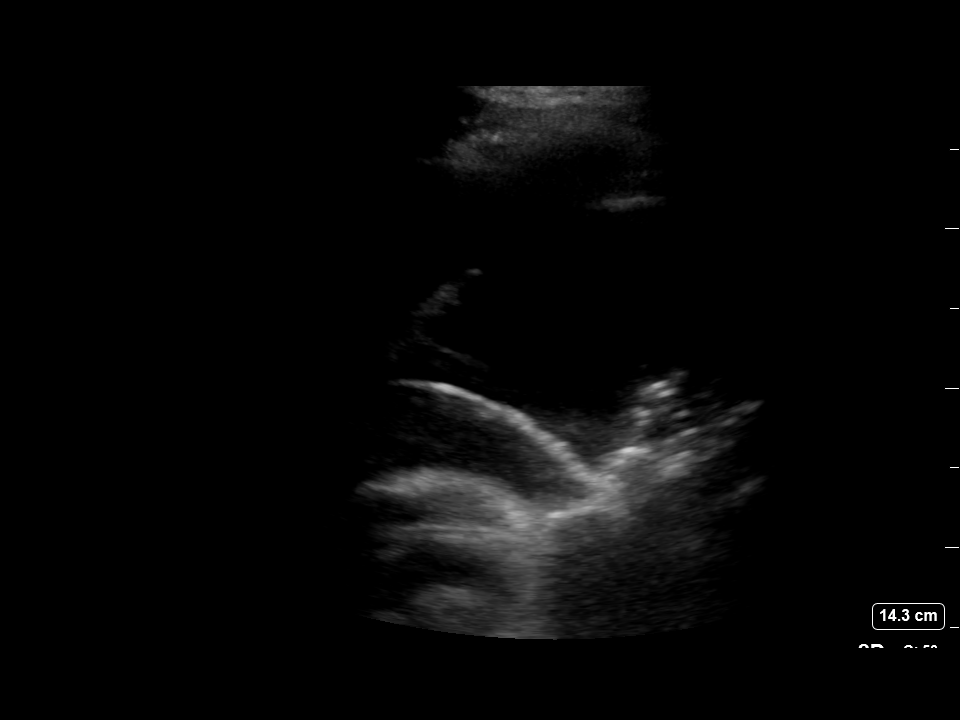
[im 4/4]
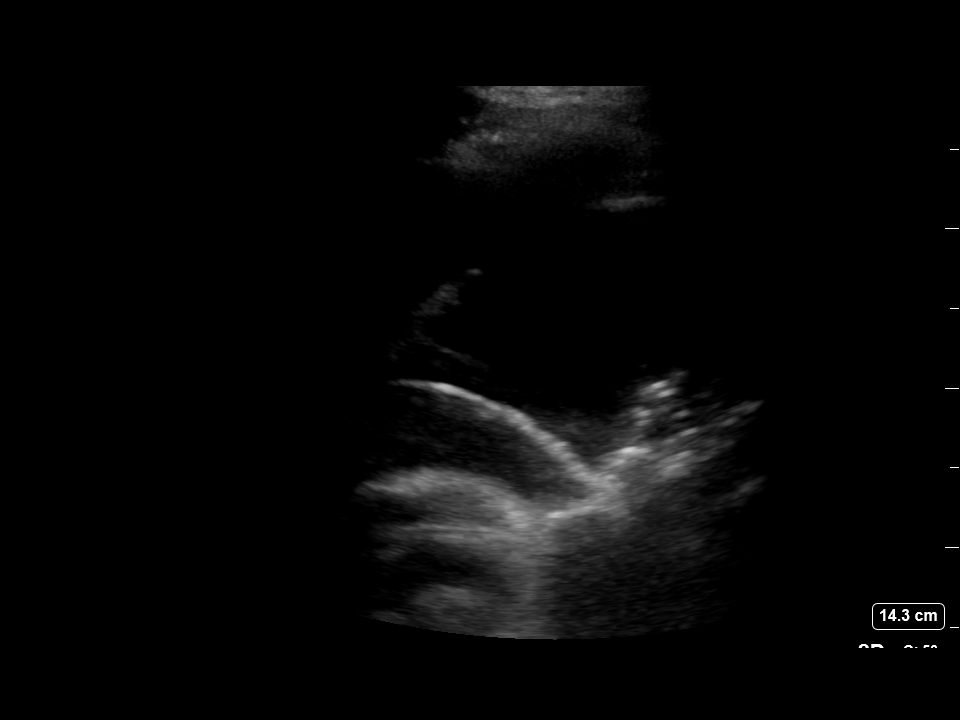

[4 of 4 positions shown; findings below may reference images not displayed]

EXAM:
ULTRASOUND GUIDED DIAGNOSTIC AND THERAPEUTIC RIGHT THORACENTESIS

MEDICATIONS:
10 mL 2% lidocaine

COMPLICATIONS:
None immediate.

PROCEDURE:
An ultrasound guided thoracentesis was thoroughly discussed with the
patient and questions answered. The benefits, risks, alternatives
and complications were also discussed. The patient understands and
wishes to proceed with the procedure. Written consent was obtained.

Ultrasound was performed to localize and mark an adequate pocket of
fluid in the right chest. The area was then prepped and draped in
the normal sterile fashion. 2% lidocaine was used for local
anesthesia. Under ultrasound guidance a 6 Fr Safe-T-Centesis
catheter was introduced. Thoracentesis was performed. The catheter
was removed and a dressing applied.
FINDINGS: A total of approximately 1.1 liters of amber fluid was removed.
Samples were sent to the laboratory as requested by the clinical
team.
IMPRESSION: Successful ultrasound guided diagnostic and therapeutic right
thoracentesis yielding 1.1 L of pleural fluid.
